# Patient Record
Sex: Female | Born: 1955 | Race: White | Hispanic: No | Smoking: Former smoker
Health system: Southern US, Community
[De-identification: ages and names within clinical notes are randomized; demographics above are authoritative.]

## PROBLEM LIST (undated history)

## (undated) DIAGNOSIS — I10 Essential (primary) hypertension: Secondary | ICD-10-CM

## (undated) DIAGNOSIS — F419 Anxiety disorder, unspecified: Secondary | ICD-10-CM

## (undated) DIAGNOSIS — K219 Gastro-esophageal reflux disease without esophagitis: Secondary | ICD-10-CM

## (undated) DIAGNOSIS — L570 Actinic keratosis: Secondary | ICD-10-CM

## (undated) DIAGNOSIS — F32A Depression, unspecified: Secondary | ICD-10-CM

## (undated) DIAGNOSIS — G4730 Sleep apnea, unspecified: Secondary | ICD-10-CM

## (undated) DIAGNOSIS — E119 Type 2 diabetes mellitus without complications: Secondary | ICD-10-CM

## (undated) DIAGNOSIS — K7400 Hepatic fibrosis, unspecified: Secondary | ICD-10-CM

## (undated) DIAGNOSIS — G35 Multiple sclerosis: Secondary | ICD-10-CM

## (undated) DIAGNOSIS — Z9889 Other specified postprocedural states: Secondary | ICD-10-CM

## (undated) HISTORY — PX: BREAST BIOPSY: SHX20

## (undated) HISTORY — DX: Actinic keratosis: L57.0

## (undated) HISTORY — PX: ENDOMETRIAL ABLATION: SHX621

## (undated) HISTORY — PX: NASAL SEPTUM SURGERY: SHX37

## (undated) HISTORY — DX: Multiple sclerosis: G35

## (undated) HISTORY — DX: Type 2 diabetes mellitus without complications: E11.9

## (undated) HISTORY — PX: BREAST EXCISIONAL BIOPSY: SUR124

---

## 2010-06-05 ENCOUNTER — Ambulatory Visit: Payer: Self-pay | Admitting: Neurology

## 2011-11-18 ENCOUNTER — Ambulatory Visit: Payer: Self-pay | Admitting: Neurology

## 2011-11-18 LAB — CREATININE, SERUM
Creatinine: 0.89 mg/dL (ref 0.60–1.30)
EGFR (African American): >60
EGFR (Non-African Amer.): >60

## 2013-01-11 ENCOUNTER — Ambulatory Visit: Payer: Self-pay | Admitting: Neurology

## 2013-12-17 ENCOUNTER — Ambulatory Visit: Payer: Self-pay | Admitting: Family

## 2014-02-16 ENCOUNTER — Encounter (INDEPENDENT_AMBULATORY_CARE_PROVIDER_SITE_OTHER): Payer: Self-pay | Admitting: *Deleted

## 2014-04-08 ENCOUNTER — Encounter: Payer: Self-pay | Admitting: Nutrition

## 2014-04-08 ENCOUNTER — Encounter: Payer: Medicare PPO | Attending: "Endocrinology | Admitting: Nutrition

## 2014-04-08 VITALS — Ht 65.0 in | Wt 211.0 lb

## 2014-04-08 DIAGNOSIS — Z6835 Body mass index (BMI) 35.0-35.9, adult: Secondary | ICD-10-CM | POA: Insufficient documentation

## 2014-04-08 DIAGNOSIS — Z713 Dietary counseling and surveillance: Secondary | ICD-10-CM | POA: Diagnosis not present

## 2014-04-08 DIAGNOSIS — Z794 Long term (current) use of insulin: Secondary | ICD-10-CM | POA: Diagnosis not present

## 2014-04-08 DIAGNOSIS — E669 Obesity, unspecified: Secondary | ICD-10-CM | POA: Diagnosis not present

## 2014-04-08 DIAGNOSIS — E1165 Type 2 diabetes mellitus with hyperglycemia: Secondary | ICD-10-CM

## 2014-04-08 DIAGNOSIS — E118 Type 2 diabetes mellitus with unspecified complications: Secondary | ICD-10-CM | POA: Insufficient documentation

## 2014-04-08 DIAGNOSIS — IMO0002 Reserved for concepts with insufficient information to code with codable children: Secondary | ICD-10-CM

## 2014-04-08 NOTE — Patient Instructions (Signed)
Goals:  Follow Diabetes Meal Plan as instructed  Eat 3 meals a day  Cut out sugar free products  Cut out diet cokes.  Increase fresh fruits and vegetables  Limit carbohydrate intake to 30-45 grams carbohydrate/meal  Monitor glucose levels as instructed by your doctor  Aim for 45 mins of physical activity daily Bring food record and glucose log to your next nutrition visit         Lose 5 lbs by next visit.

## 2014-04-08 NOTE — Progress Notes (Signed)
  Medical Nutrition Therapy:  Appt start time: 4128 end time:  1615.  Assessment:  Primary concerns today Diabetes. A1C was 9.6%.  Has been on Lantus 3 months.. 40 units of Lantus.  Rash was reason for stopping Invokanan but fouind out rash was from something else. Requested her to reconsider Invokana with Dr. Dorris Fetch. Took her self off of depression medication. Advised to talk to PCP. [facility] and left message for them to contact her to discuss coming off her depression medication. Doesn't eat beef.  She does her own cooking mostly. Currently separated.  7 day avg 147 mg/dl 14 day avg 154 mg/dl 30 day avg 146 mg/dl.  Preferred Learning Style:   Auditory  Visual  Hands on  Learning Readiness:   Not ready  Contemplating  Ready  Change in progress   MEDICATIONS: See list. Recently stopped her own depression medications. Called her PCP to inform them and to contact pt. If needed.   DIETARY INTAKE:  24-hr recall:  B ( AM): Cherrios regular, 1% milk,  Snk ( AM): none  L ( PM): [place] stick, peanutbutter and  Sf honey sandwich, Scoop sugar free ice cream, Diet coke Snk ( PM): D ( PM): meat and some vegetables and LS V8 juice Snk ( PM):  Beverages: Diet coke  Usual physical activity: Treatmill- 25-40 mins every night.. Estimated energy needs: 1500 calories 170 g carbohydrates 112 g protein 42 g fat  Progress Towards Goal(s):  In progress.   Nutritional Diagnosis:  NB-1.2 Harmful beliefs/attitudes about food or nutrition-related topics (use with caution) As related to Diabetes.  As evidenced by A1C 9.8%.    Intervention:  Nutrition Counseling and diabetes education on diet, meal planning, CHO Counting, target ranges for blood sugars, benefits of exercise and weight loss and need to avoid sugar free products of candy and diet sodas.  Goals:  Follow Diabetes Meal Plan as instructed  Eat 3 meals a day  Cut out sugar free products  Cut out diet  cokes.  Increase fresh fruits and vegetables  Limit carbohydrate intake to 30-45 grams carbohydrate/meal  Monitor glucose levels as instructed by your doctor  Aim for 45 mins of physical activity daily  Get A1C down to 7% in three months. Bring food record and glucose log to your next nutrition visit         Lose 5 lbs by next visit.  Teaching Method Utilized: Visual Auditory Hands on  Handouts given during visit include:  My Plate  The Meal Card  Barriers to learning/adherence to lifestyle change: none  Demonstrated degree of understanding via:  Teach Back   Monitoring/Evaluation:  Dietary intake, exercise, meal planning, SBG, and body weight in 1 month(s).

## 2014-05-26 ENCOUNTER — Encounter: Payer: Medicare PPO | Attending: "Endocrinology | Admitting: Nutrition

## 2014-05-26 VITALS — Ht 65.0 in | Wt 203.6 lb

## 2014-05-26 DIAGNOSIS — Z6833 Body mass index (BMI) 33.0-33.9, adult: Secondary | ICD-10-CM | POA: Insufficient documentation

## 2014-05-26 DIAGNOSIS — Z713 Dietary counseling and surveillance: Secondary | ICD-10-CM | POA: Diagnosis not present

## 2014-05-26 DIAGNOSIS — E118 Type 2 diabetes mellitus with unspecified complications: Secondary | ICD-10-CM | POA: Diagnosis present

## 2014-05-26 DIAGNOSIS — Z794 Long term (current) use of insulin: Secondary | ICD-10-CM | POA: Diagnosis not present

## 2014-05-26 DIAGNOSIS — E1165 Type 2 diabetes mellitus with hyperglycemia: Secondary | ICD-10-CM

## 2014-05-26 DIAGNOSIS — E669 Obesity, unspecified: Secondary | ICD-10-CM | POA: Diagnosis not present

## 2014-05-26 DIAGNOSIS — IMO0002 Reserved for concepts with insufficient information to code with codable children: Secondary | ICD-10-CM
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
## 2014-05-26 NOTE — Patient Instructions (Addendum)
Goals: Keep up the good work!! You lost another 5 lbs!!   Follow Diabetes Meal Plan as instructed  Eat more lower carb vegetables with meals.  Increase water and cut out diet soda  Increase physical activity to 45 minutes three times per week  Do not skip meals.  Take Insulin as prescribed.  Lose 1 lbs per week.  Goal A1C 7% in three months.

## 2014-08-10 ENCOUNTER — Encounter: Payer: Medicare PPO | Attending: "Endocrinology | Admitting: Nutrition

## 2014-08-10 VITALS — Ht 65.0 in | Wt 207.0 lb

## 2014-08-10 DIAGNOSIS — Z794 Long term (current) use of insulin: Secondary | ICD-10-CM | POA: Insufficient documentation

## 2014-08-10 DIAGNOSIS — IMO0002 Reserved for concepts with insufficient information to code with codable children: Secondary | ICD-10-CM

## 2014-08-10 DIAGNOSIS — Z6834 Body mass index (BMI) 34.0-34.9, adult: Secondary | ICD-10-CM | POA: Insufficient documentation

## 2014-08-10 DIAGNOSIS — E669 Obesity, unspecified: Secondary | ICD-10-CM | POA: Insufficient documentation

## 2014-08-10 DIAGNOSIS — Z713 Dietary counseling and surveillance: Secondary | ICD-10-CM | POA: Insufficient documentation

## 2014-08-10 DIAGNOSIS — E1165 Type 2 diabetes mellitus with hyperglycemia: Secondary | ICD-10-CM

## 2014-08-10 DIAGNOSIS — E118 Type 2 diabetes mellitus with unspecified complications: Secondary | ICD-10-CM | POA: Insufficient documentation

## 2014-08-10 NOTE — Patient Instructions (Addendum)
Goals: Keep up the good work but get back on track with exercise and eating more fresh fruits and vegetables.!!   Follow Diabetes Meal Plan as instructed  Eat more lower carb vegetables with meals.  Increase water and cut out diet soda  Increase physical activity to 45 minutes three times per week  Take Insulin as prescribed  Resume taking Invokana..  Get weight down to 200 lbs..  Get A1C % down 7%  in three months.

## 2014-08-10 NOTE — Progress Notes (Signed)
  Medical Nutrition Therapy:  Appt start time: 1400 end time:  1430  Assessment:  Primary concerns today Diabetes.  Most recent A1C is now 8% . Hasn't been as diligent about exercising and watching what she has been eating. BS higher than they were she notes. Ready to get back on track. Now back with her husband. BS log   Changes: BS 114-167 mg/cl. 40 of Lantus daily. Testing in am. Stopped Invokana due to hives. 2-3 weeks ago but hives still on her arm. Dr. Dorris Fetch told her today and resume taking INvokana since hives probably were not related to [facility]. Willing to resume walking on treadmill and get back to cutting out snacks and overeating. Still working on cutting out diet coke but drinking less of it.   Preferred Learning Style:   Auditory  Visual  Hands on  Learning Readiness:    Ready  Change in progress  MEDICATIONS: See list.   DIETARY INTAKE:  24-hr recall:  B ( AM): Eggs, bacon and hash brown, coffee Snk ( AM): hard boiled egg L ( PM): Vegetables soup, and 1 slice rasin bread, water Snk ( PM): D ( PM): [place] chowder  2 cup, and toss salad with balsamic dressing. Water Snk ( PM):  Beverages: Water, Diet coke.  Usual physical activity: has stopped walking Estimated energy needs: 1500 calories 170 g carbohydrates 112 g protein 42 g fat  Progress Towards Goal(s):  In progress.   Nutritional Diagnosis:  NB-1.2 Harmful beliefs/attitudes about food or nutrition-related topics (use with caution) As related to Diabetes.  As evidenced by A1C 9.8%.    Intervention:  Nutrition Counseling and diabetes education on diet, meal planning, CHO Counting, reading food labels and importance of eating on a regular schedule. Footcare and testing blood sugars. Target ranges for blood sugars.   Goals: Keep up the good work but get back on track with exercise and eating more fresh fruits and vegetables.!!   Follow Diabetes Meal Plan as instructed  Eat more lower carb vegetables  with meals.  Increase water and cut out diet soda  Increase physical activity to 45 minutes three times per week  Take Insulin as prescribed  Resume taking Invokana..  Get weight down to 200 lbs..  Get A1C % down 7%  in three months.    Teaching Method Utilized: Visual Auditory Hands on  Handouts given during visit include:  My Plate  The Meal Card  Barriers to learning/adherence to lifestyle change: none  Demonstrated degree of understanding via:  Teach Back   Monitoring/Evaluation:  Dietary intake, exercise, meal planning, SBG, and body weight in 3 month(s).

## 2014-11-10 ENCOUNTER — Encounter: Payer: Self-pay | Admitting: Nutrition

## 2014-11-10 ENCOUNTER — Encounter: Payer: Medicare PPO | Attending: "Endocrinology | Admitting: Nutrition

## 2014-11-10 VITALS — Ht 64.5 in | Wt 206.0 lb

## 2014-11-10 DIAGNOSIS — E669 Obesity, unspecified: Secondary | ICD-10-CM

## 2014-11-10 DIAGNOSIS — IMO0002 Reserved for concepts with insufficient information to code with codable children: Secondary | ICD-10-CM

## 2014-11-10 DIAGNOSIS — E1165 Type 2 diabetes mellitus with hyperglycemia: Secondary | ICD-10-CM

## 2014-11-10 DIAGNOSIS — E118 Type 2 diabetes mellitus with unspecified complications: Principal | ICD-10-CM

## 2014-11-10 DIAGNOSIS — Z794 Long term (current) use of insulin: Principal | ICD-10-CM

## 2014-11-10 NOTE — Patient Instructions (Signed)
Goals: Keep up the good work but get back on track with exercise and eating more fresh fruits and vegetables.!!   Follow Diabetes Meal Plan as instructed Cut out snacks between meals. Reduce carb intake at meals. Increase low carb vegetables. Cut out diet cokes. Get A1C to 7% or below. Increase physical activity to 30 minutes daily. Check blood sugars at bedtime a few times per week.
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
## 2014-11-24 ENCOUNTER — Encounter: Payer: Self-pay | Admitting: "Endocrinology

## 2014-11-24 ENCOUNTER — Ambulatory Visit (INDEPENDENT_AMBULATORY_CARE_PROVIDER_SITE_OTHER): Payer: Medicare PPO | Admitting: "Endocrinology

## 2014-11-24 VITALS — BP 128/84 | HR 96 | Ht 66.0 in | Wt 207.0 lb

## 2014-11-24 DIAGNOSIS — E119 Type 2 diabetes mellitus without complications: Secondary | ICD-10-CM | POA: Insufficient documentation

## 2014-11-24 DIAGNOSIS — E1165 Type 2 diabetes mellitus with hyperglycemia: Secondary | ICD-10-CM | POA: Diagnosis not present

## 2014-11-24 DIAGNOSIS — E785 Hyperlipidemia, unspecified: Secondary | ICD-10-CM | POA: Insufficient documentation

## 2014-11-24 DIAGNOSIS — Z794 Long term (current) use of insulin: Secondary | ICD-10-CM

## 2014-11-24 DIAGNOSIS — I10 Essential (primary) hypertension: Secondary | ICD-10-CM | POA: Diagnosis not present

## 2014-11-24 DIAGNOSIS — IMO0002 Reserved for concepts with insufficient information to code with codable children: Secondary | ICD-10-CM

## 2014-11-24 DIAGNOSIS — E118 Type 2 diabetes mellitus with unspecified complications: Secondary | ICD-10-CM | POA: Diagnosis not present

## 2014-11-24 DIAGNOSIS — E782 Mixed hyperlipidemia: Secondary | ICD-10-CM | POA: Insufficient documentation

## 2014-11-24 MED ORDER — CANAGLIFLOZIN 100 MG PO TABS: 100.0000 mg | ORAL_TABLET | Freq: Every day | ORAL | Status: DC

## 2014-11-24 MED ORDER — METFORMIN HCL 1000 MG PO TABS: 1000.0000 mg | ORAL_TABLET | Freq: Two times a day (BID) | ORAL | Status: DC

## 2014-11-24 NOTE — Progress Notes (Signed)
Subjective:    Patient ID: Melanie Schultz, female    DOB: [birth year],    Past Medical History  Diagnosis Date  . Diabetes mellitus without complication ([place])   . Multiple sclerosis [facility])    Past Surgical History  Procedure Laterality Date  . Cesarean section    . Nasal septum surgery     Social History   Social History  . Marital Status: Divorced    Spouse Name: N/A  . Number of Children: N/A  . Years of Education: N/A   Social History Main Topics  . Smoking status: Former Research scientist (life sciences)  . Smokeless tobacco: None  . Alcohol Use: No  . Drug Use: No  . Sexual Activity: Not Asked   Other Topics Concern  . None   Social History Narrative   Outpatient Encounter Prescriptions as of 11/24/2014  Medication Sig  . amLODipine (NORVASC) 5 MG tablet Take 5 mg by mouth daily.  Marland Kitchen atenolol (TENORMIN) 50 MG tablet Take 50 mg by mouth daily.  Marland Kitchen atorvastatin (LIPITOR) 80 MG tablet Take 80 mg by mouth daily.  . canagliflozin (INVOKANA) 100 MG TABS tablet Take 1 tablet (100 mg total) by mouth daily.  . insulin glargine (LANTUS) 100 UNIT/ML injection Inject into the skin at bedtime.  . metFORMIN (GLUCOPHAGE) 1000 MG tablet Take 1 tablet (1,000 mg total) by mouth 2 (two) times daily with a meal.  . methylphenidate (DAYTRANA) 20 MG/9HR Place 1 patch onto the skin daily. wear patch for 9 hours only each day  . omeprazole (PRILOSEC) 20 MG capsule Take 20 mg by mouth daily.  . Teriflunomide 14 MG TABS Take by mouth.  . [DISCONTINUED] canagliflozin (INVOKANA) 100 MG TABS tablet Take 100 mg by mouth.  . [DISCONTINUED] metFORMIN (GLUCOPHAGE) 1000 MG tablet Take 1,000 mg by mouth 2 (two) times daily with a meal.   No facility-administered encounter medications on file as of 11/24/2014.   ALLERGIES: No Known Allergies VACCINATION STATUS:  There is no immunization history on file for this patient.  Diabetes She presents for her follow-up diabetic visit. She has type 2 diabetes mellitus. Onset  time: She was diagnosed at approximate age of 67 years. Her disease course has been improving. There are no hypoglycemic associated symptoms. Pertinent negatives for hypoglycemia include no confusion, headaches, pallor or seizures. There are no diabetic associated symptoms. Pertinent negatives for diabetes include no chest pain, no fatigue, no polydipsia, no polyphagia and no polyuria. There are no hypoglycemic complications. Symptoms are improving. There are no diabetic complications. Risk factors for coronary artery disease include dyslipidemia, hypertension and obesity. Current diabetic treatment includes insulin injections and oral agent (dual therapy). She is compliant with treatment most of the time. She has had a previous visit with a dietitian. She participates in exercise intermittently. Her home blood glucose trend is decreasing steadily. Her overall blood glucose range is 140-180 mg/dl. An ACE inhibitor/angiotensin II receptor blocker is being taken. Eye exam is current.  Hyperlipidemia This is a chronic problem. The current episode started more than 1 year ago. Pertinent negatives include no chest pain, myalgias or shortness of breath. Current antihyperlipidemic treatment includes statins.  Hypertension This is a chronic problem. The current episode started more than 1 year ago. Pertinent negatives include no chest pain, headaches, palpitations or shortness of breath. Past treatments include ACE inhibitors.     Review of Systems  Constitutional: Negative for fatigue and unexpected weight change.  HENT: Negative for trouble swallowing and voice change.  Eyes: Negative for visual disturbance.  Respiratory: Negative for cough, shortness of breath and wheezing.   Cardiovascular: Negative for chest pain, palpitations and leg swelling.  Gastrointestinal: Negative for nausea, vomiting and diarrhea.  Endocrine: Negative for cold intolerance, heat intolerance, polydipsia, polyphagia and polyuria.   Musculoskeletal: Negative for myalgias and arthralgias.  Skin: Negative for color change, pallor, rash and wound.  Neurological: Negative for seizures and headaches.  Psychiatric/Behavioral: Negative for suicidal ideas and confusion.    Objective:    BP 128/84 mmHg  Pulse 96  Ht 5' 6" (1.676 m)  Wt 207 lb (93.895 kg)  BMI 33.43 kg/m2  SpO2 97%  Wt Readings from Last 3 Encounters:  11/24/14 207 lb (93.895 kg)  11/10/14 206 lb (93.441 kg)  08/10/14 207 lb (93.895 kg)    Physical Exam  Constitutional: She is oriented to person, place, and time. She appears well-developed.  HENT:  Head: Normocephalic and atraumatic.  Eyes: EOM are normal.  Neck: Normal range of motion. Neck supple. No tracheal deviation present. No thyromegaly present.  Cardiovascular: Normal rate and regular rhythm.   Pulmonary/Chest: Effort normal and breath sounds normal.  Abdominal: Soft. Bowel sounds are normal. There is no tenderness. There is no guarding.  Musculoskeletal: Normal range of motion. She exhibits no edema.  Neurological: She is alert and oriented to person, place, and time. She has normal reflexes. No cranial nerve deficit. Coordination normal.  Skin: Skin is warm and dry. No rash noted. No erythema. No pallor.  Psychiatric: She has a normal mood and affect. Judgment normal.    Results for orders placed or performed in visit on 11/18/11  Creatinine, serum  Result Value Ref Range   Creatinine 0.89 0.60-1.30 mg/dL   EGFR (African American) >60    EGFR (Non-African Amer.) >60    Complete Blood Count (Most recent): No results found for: WBC, HGB, HCT, MCV, PLT Chemistry (most recent): Lab Results  Component Value Date   CREATININE 0.89 11/18/2011      Assessment & Plan:   1. Uncontrolled type 2 diabetes mellitus with complication, with long-term current use of insulin ([place])  Patient came with improved glucose profile, EAG at  138, whenever her recent labs did not include A1c.   Glucose logs and insulin administration records pertaining to this visit,  to be scanned into patient's records.  Recent labs reviewed. - Patient remains at a high risk for more acute and chronic complications of diabetes which include CAD, CVA, CKD, retinopathy, and neuropathy. These are all discussed in detail with the patient.  - I have re-counseled the patient on diet management and weight loss  by adopting a carbohydrate restricted / protein rich  Diet. - Patient is advised to stick to a routine mealtimes to eat 3 meals  a day and avoid unnecessary snacks ( to snack only to correct hypoglycemia).  - Suggestion is made for patient to avoid simple carbohydrates   from their diet including Cakes , Desserts, Ice Cream,  Soda (  diet and regular) , Sweet Tea , Candies,  Chips, Cookies, Artificial Sweeteners,   and "Sugar-free" Products .  This will help patient to have stable blood glucose profile and potentially avoid unintended  Weight gain.  - The patient  has been  scheduled with Jearld Fenton, RDN, CDE for individualized DM education. - I have approached patient with the following individualized plan to manage diabetes and patient agrees.  -Continue Lantus 40 units qhs, associated with monitoring of BG QAM.  -  Adjustment parameters for hypo and hyperglycemia were given in a written document to patient.  -Patient is encouraged to call clinic for blood glucose levels less than 70 or above 300 mg /dl. -I will continue MTF 1gm po BID, and Invokana 182m po qam.  - Patient will be considered for incretin therapy as appropriate next visit. - Patient specific target  for A1c; LDL, HDL, Triglycerides, and  Waist Circumference were discussed in detail.  2) BP/HTN: Controlled. Continue current medications including ACEI. 3) Lipids/HPL:  continue statins. 4)  Weight/Diet: CDE consult in progress, exercise, and carbohydrates information provided.  5) Chronic Care/Health Maintenance:  -Patient  on  ACEI and Statin medications and encouraged to continue to follow up with Ophthalmology, Podiatrist at least yearly or according to recommendations, and advised to  stay away from smoking. I have recommended yearly flu vaccine and pneumonia vaccination at least every 5 years; moderate intensity exercise for up to 150 minutes weekly; and  sleep for at least 7 hours a day.  I advised patient to maintain close follow up with their PCP for primary care needs.  Patient is asked to bring meter and  blood glucose logs during their next visit.   Follow up plan: Return in about 3 months (around 02/24/2015) for diabetes, high blood pressure, high cholesterol, follow up with pre-visit labs, meter, and logs.  GGlade Lloyd MD Phone: [phone] Fax: [phone]  11/24/2014, 4:16 PM

## 2014-11-24 NOTE — Patient Instructions (Signed)

## 2014-11-25 ENCOUNTER — Other Ambulatory Visit: Payer: Self-pay

## 2014-11-25 MED ORDER — CANAGLIFLOZIN 100 MG PO TABS: 100.0000 mg | ORAL_TABLET | Freq: Every day | ORAL | Status: DC

## 2014-12-23 ENCOUNTER — Other Ambulatory Visit: Payer: Self-pay

## 2014-12-23 MED ORDER — INSULIN GLARGINE 100 UNIT/ML ~~LOC~~ SOLN: 40.0000 [IU] | Freq: Every day | SUBCUTANEOUS | Status: DC

## 2014-12-26 ENCOUNTER — Telehealth: Payer: Self-pay | Admitting: "Endocrinology

## 2014-12-26 MED ORDER — INSULIN GLARGINE 100 UNIT/ML SOLOSTAR PEN: 40.0000 [IU] | PEN_INJECTOR | Freq: Every day | SUBCUTANEOUS | Status: DC

## 2014-12-26 NOTE — Addendum Note (Signed)
Addended by: Lavell Luster A on: 12/26/2014 12:59 PM   Modules accepted: Orders

## 2014-12-26 NOTE — Telephone Encounter (Signed)
Needs refill on lantus

## 2015-02-10 ENCOUNTER — Other Ambulatory Visit: Payer: Self-pay | Admitting: "Endocrinology

## 2015-02-21 LAB — HEMOGLOBIN A1C: Hemoglobin A1C: 7.7

## 2015-02-27 ENCOUNTER — Ambulatory Visit (INDEPENDENT_AMBULATORY_CARE_PROVIDER_SITE_OTHER): Payer: Medicare PPO | Admitting: "Endocrinology

## 2015-02-27 ENCOUNTER — Encounter: Payer: Self-pay | Admitting: "Endocrinology

## 2015-02-27 VITALS — BP 117/85 | HR 82 | Ht 67.0 in | Wt 206.0 lb

## 2015-02-27 DIAGNOSIS — E118 Type 2 diabetes mellitus with unspecified complications: Secondary | ICD-10-CM

## 2015-02-27 DIAGNOSIS — E1165 Type 2 diabetes mellitus with hyperglycemia: Secondary | ICD-10-CM | POA: Diagnosis not present

## 2015-02-27 DIAGNOSIS — Z794 Long term (current) use of insulin: Secondary | ICD-10-CM

## 2015-02-27 DIAGNOSIS — E785 Hyperlipidemia, unspecified: Secondary | ICD-10-CM

## 2015-02-27 DIAGNOSIS — I10 Essential (primary) hypertension: Secondary | ICD-10-CM | POA: Diagnosis not present

## 2015-02-27 DIAGNOSIS — IMO0002 Reserved for concepts with insufficient information to code with codable children: Secondary | ICD-10-CM

## 2015-02-27 MED ORDER — CANAGLIFLOZIN 100 MG PO TABS: 100.0000 mg | ORAL_TABLET | Freq: Every day | ORAL | Status: DC

## 2015-02-27 MED ORDER — INSULIN PEN NEEDLE 31G X 8 MM MISC: 1.0000 | Status: DC

## 2015-02-27 MED ORDER — INSULIN GLARGINE 100 UNIT/ML SOLOSTAR PEN: 46.0000 [IU] | PEN_INJECTOR | Freq: Every day | SUBCUTANEOUS | Status: DC

## 2015-02-27 NOTE — Progress Notes (Signed)
Subjective:    Patient ID: Barbarann Ehlers, female    DOB: 1955-05-14,    Past Medical History  Diagnosis Date  . Diabetes mellitus without complication ([place])   . Multiple sclerosis [facility])    Past Surgical History  Procedure Laterality Date  . Cesarean section    . Nasal septum surgery     Social History   Social History  . Marital Status: Divorced    Spouse Name: N/A  . Number of Children: N/A  . Years of Education: N/A   Social History Main Topics  . Smoking status: Former Research scientist (life sciences)  . Smokeless tobacco: None  . Alcohol Use: No  . Drug Use: No  . Sexual Activity: Not Asked   Other Topics Concern  . None   Social History Narrative   Outpatient Encounter Prescriptions as of 02/27/2015  Medication Sig  . canagliflozin (INVOKANA) 100 MG TABS tablet Take 1 tablet (100 mg total) by mouth daily.  . Insulin Glargine (LANTUS SOLOSTAR) 100 UNIT/ML Solostar Pen Inject 46 Units into the skin at bedtime.  . metFORMIN (GLUCOPHAGE) 1000 MG tablet Take 1 tablet (1,000 mg total) by mouth 2 (two) times daily with a meal.  . [DISCONTINUED] canagliflozin (INVOKANA) 100 MG TABS tablet Take 1 tablet (100 mg total) by mouth daily.  . [DISCONTINUED] Insulin Glargine (LANTUS SOLOSTAR) 100 UNIT/ML Solostar Pen Inject 40 Units into the skin at bedtime.  Marland Kitchen amLODipine (NORVASC) 5 MG tablet Take 5 mg by mouth daily.  Marland Kitchen atenolol (TENORMIN) 50 MG tablet Take 50 mg by mouth daily.  Marland Kitchen atorvastatin (LIPITOR) 80 MG tablet Take 80 mg by mouth daily.  . B-D ULTRAFINE III SHORT PEN 31G X 8 MM MISC USE AS DIRECTED  . Insulin Pen Needle (B-D ULTRAFINE III SHORT PEN) 31G X 8 MM MISC 1 each by Does not apply route as directed.  . methylphenidate (DAYTRANA) 20 MG/9HR Place 1 patch onto the skin daily. wear patch for 9 hours only each day  . omeprazole (PRILOSEC) 20 MG capsule Take 20 mg by mouth daily.  . Teriflunomide 14 MG TABS Take by mouth.   No facility-administered encounter medications on file as of  02/27/2015.   ALLERGIES: No Known Allergies VACCINATION STATUS:  There is no immunization history on file for this patient.  Diabetes She presents for her follow-up diabetic visit. She has type 2 diabetes mellitus. Onset time: She was diagnosed at approximate age of 59 years. Her disease course has been stable. There are no hypoglycemic associated symptoms. Pertinent negatives for hypoglycemia include no confusion, headaches, pallor or seizures. There are no diabetic associated symptoms. Pertinent negatives for diabetes include no chest pain, no fatigue, no polydipsia, no polyphagia and no polyuria. There are no hypoglycemic complications. Symptoms are stable. There are no diabetic complications. Risk factors for coronary artery disease include dyslipidemia, hypertension and obesity. Current diabetic treatment includes insulin injections and oral agent (dual therapy). She is compliant with treatment most of the time. She is following a generally unhealthy diet. She has had a previous visit with a dietitian. She participates in exercise intermittently. Her home blood glucose trend is decreasing steadily. Her breakfast blood glucose range is generally 180-200 mg/dl. Her overall blood glucose range is 140-180 mg/dl. An ACE inhibitor/angiotensin II receptor blocker is being taken. Eye exam is current.  Hyperlipidemia This is a chronic problem. The current episode started more than 1 year ago. Pertinent negatives include no chest pain, myalgias or shortness of breath. Current antihyperlipidemic  treatment includes statins.  Hypertension This is a chronic problem. The current episode started more than 1 year ago. Pertinent negatives include no chest pain, headaches, palpitations or shortness of breath. Past treatments include ACE inhibitors.     Review of Systems  Constitutional: Negative for fatigue and unexpected weight change.  HENT: Negative for trouble swallowing and voice change.   Eyes: Negative  for visual disturbance.  Respiratory: Negative for cough, shortness of breath and wheezing.   Cardiovascular: Negative for chest pain, palpitations and leg swelling.  Gastrointestinal: Negative for nausea, vomiting and diarrhea.  Endocrine: Negative for cold intolerance, heat intolerance, polydipsia, polyphagia and polyuria.  Musculoskeletal: Negative for myalgias and arthralgias.  Skin: Negative for color change, pallor, rash and wound.  Neurological: Negative for seizures and headaches.  Psychiatric/Behavioral: Negative for suicidal ideas and confusion.    Objective:    BP 117/85 mmHg  Pulse 82  Ht _0  (1.702 m)  Wt 206 lb (93.441 kg)  BMI 32.26 kg/m2  SpO2 98%  Wt Readings from Last 3 Encounters:  02/27/15 206 lb (93.441 kg)  11/24/14 207 lb (93.895 kg)  11/10/14 206 lb (93.441 kg)    Physical Exam  Constitutional: She is oriented to person, place, and time. She appears well-developed.  HENT:  Head: Normocephalic and atraumatic.  Eyes: EOM are normal.  Neck: Normal range of motion. Neck supple. No tracheal deviation present. No thyromegaly present.  Cardiovascular: Normal rate and regular rhythm.   Pulmonary/Chest: Effort normal and breath sounds normal.  Abdominal: Soft. Bowel sounds are normal. There is no tenderness. There is no guarding.  Musculoskeletal: Normal range of motion. She exhibits no edema.  Neurological: She is alert and oriented to person, place, and time. She has normal reflexes. No cranial nerve deficit. Coordination normal.  Skin: Skin is warm and dry. No rash noted. No erythema. No pallor.  Psychiatric: She has a normal mood and affect. Judgment normal.    Results for orders placed or performed in visit on 11/18/11  Creatinine, serum  Result Value Ref Range   Creatinine 0.89 0.60-1.30 mg/dL   EGFR (African American) >60    EGFR (Non-African Amer.) >60    Complete Blood Count (Most recent): No results found for: WBC, HGB, HCT, MCV,  PLT Chemistry (most recent): Lab Results  Component Value Date   CREATININE 0.89 11/18/2011   02/21/2015 A1c 7.7%.   Assessment & Plan:   1. Uncontrolled type 2 diabetes mellitus with complication, with long-term current use of insulin ([place])  Patient came with improved glucose profile, fasting EAG at  159, A1c improved to 7.7% .   Glucose logs and insulin administration records pertaining to this visit,  to be scanned into patient's records.  Recent labs reviewed. - Patient remains at a high risk for more acute and chronic complications of diabetes which include CAD, CVA, CKD, retinopathy, and neuropathy. These are all discussed in detail with the patient.  - I have re-counseled the patient on diet management and weight loss  by adopting a carbohydrate restricted / protein rich  Diet. - Patient is advised to stick to a routine mealtimes to eat 3 meals  a day and avoid unnecessary snacks ( to snack only to correct hypoglycemia).  - Suggestion is made for patient to avoid simple carbohydrates   from their diet including Cakes , Desserts, Ice Cream,  Soda (  diet and regular) , Sweet Tea , Candies,  Chips, Cookies, Artificial Sweeteners,   and "Sugar-free" Products .  This will help patient to have stable blood glucose profile and potentially avoid unintended  Weight gain.  - The patient  has been  scheduled with Jearld Fenton, RDN, CDE for individualized DM education. - I have approached patient with the following individualized plan to manage diabetes and patient agrees.  -Increase Lantus to 46  units qhs, associated with monitoring of BG QAM.  -Adjustment parameters for hypo and hyperglycemia were given in a written document to patient.  -Patient is encouraged to call clinic for blood glucose levels less than 70 or above 300 mg /dl. -I will continue MTF 1gm po BID, and Invokana 136m po qam.  - Patient will be considered for incretin therapy as appropriate next visit. - Patient  specific target  for A1c; LDL, HDL, Triglycerides, and  Waist Circumference were discussed in detail.  2) BP/HTN: Controlled. Continue current medications including ACEI. 3) Lipids/HPL:  continue statins. 4)  Weight/Diet: CDE consult in progress, exercise, and carbohydrates information provided.  5) Chronic Care/Health Maintenance:  -Patient  on ACEI and Statin medications and encouraged to continue to follow up with Ophthalmology, Podiatrist at least yearly or according to recommendations, and advised to  stay away from smoking. I have recommended yearly flu vaccine and pneumonia vaccination at least every 5 years; moderate intensity exercise for up to 150 minutes weekly; and  sleep for at least 7 hours a day.  I advised patient to maintain close follow up with their PCP for primary care needs.  Patient is asked to bring meter and  blood glucose logs during their next visit.   Follow up plan: Return in about 3 months (around 05/28/2015) for diabetes, high blood pressure, high cholesterol, follow up with pre-visit labs, meter, and logs.  GGlade Lloyd MD Phone: [phone] Fax: [phone]  02/27/2015, 2:15 PM

## 2015-02-27 NOTE — Patient Instructions (Signed)

## 2015-04-03 ENCOUNTER — Encounter: Payer: Self-pay | Admitting: Cardiology

## 2015-05-23 ENCOUNTER — Other Ambulatory Visit: Payer: Self-pay | Admitting: "Endocrinology

## 2015-05-24 LAB — BASIC METABOLIC PANEL
BUN: 14 mg/dL (ref 7–25)
CALCIUM: 9.5 mg/dL (ref 8.6–10.4)
CO2: 24 mmol/L (ref 20–31)
Chloride: 104 mmol/L (ref 98–110)
Creat: 0.69 mg/dL (ref 0.50–0.99)
Glucose, Bld: 159 mg/dL — ABNORMAL HIGH (ref 65–99)
Potassium: 4.1 mmol/L (ref 3.5–5.3)
SODIUM: 141 mmol/L (ref 135–146)

## 2015-05-24 LAB — HEMOGLOBIN A1C
Hgb A1c MFr Bld: 7.4 % — ABNORMAL HIGH (ref <5.7)
Mean Plasma Glucose: 166 mg/dL

## 2015-06-02 ENCOUNTER — Ambulatory Visit (INDEPENDENT_AMBULATORY_CARE_PROVIDER_SITE_OTHER): Payer: Medicare PPO | Admitting: "Endocrinology

## 2015-06-02 ENCOUNTER — Encounter: Payer: Self-pay | Admitting: "Endocrinology

## 2015-06-02 VITALS — BP 131/87 | HR 97 | Ht 67.0 in | Wt 204.0 lb

## 2015-06-02 DIAGNOSIS — I10 Essential (primary) hypertension: Secondary | ICD-10-CM

## 2015-06-02 DIAGNOSIS — E118 Type 2 diabetes mellitus with unspecified complications: Secondary | ICD-10-CM

## 2015-06-02 DIAGNOSIS — IMO0002 Reserved for concepts with insufficient information to code with codable children: Secondary | ICD-10-CM

## 2015-06-02 DIAGNOSIS — E785 Hyperlipidemia, unspecified: Secondary | ICD-10-CM | POA: Diagnosis not present

## 2015-06-02 DIAGNOSIS — Z794 Long term (current) use of insulin: Secondary | ICD-10-CM

## 2015-06-02 DIAGNOSIS — E1165 Type 2 diabetes mellitus with hyperglycemia: Secondary | ICD-10-CM

## 2015-06-02 NOTE — Patient Instructions (Signed)

## 2015-06-02 NOTE — Progress Notes (Signed)
Subjective:    Patient ID: Melanie Schultz, female    DOB: 28-Jun-1955,    Past Medical History  Diagnosis Date  . Diabetes mellitus without complication ([place])   . Multiple sclerosis [facility])    Past Surgical History  Procedure Laterality Date  . Cesarean section    . Nasal septum surgery     Social History   Social History  . Marital Status: Divorced    Spouse Name: N/A  . Number of Children: N/A  . Years of Education: N/A   Social History Main Topics  . Smoking status: Former Research scientist (life sciences)  . Smokeless tobacco: None  . Alcohol Use: No  . Drug Use: No  . Sexual Activity: Not Asked   Other Topics Concern  . None   Social History Narrative   Outpatient Encounter Prescriptions as of 06/02/2015  Medication Sig  . amLODipine (NORVASC) 5 MG tablet Take 5 mg by mouth daily.  Marland Kitchen atenolol (TENORMIN) 50 MG tablet Take 50 mg by mouth daily.  Marland Kitchen atorvastatin (LIPITOR) 80 MG tablet Take 80 mg by mouth daily.  . B-D ULTRAFINE III SHORT PEN 31G X 8 MM MISC USE AS DIRECTED  . canagliflozin (INVOKANA) 100 MG TABS tablet Take 1 tablet (100 mg total) by mouth daily.  . Insulin Glargine (LANTUS SOLOSTAR) 100 UNIT/ML Solostar Pen Inject 46 Units into the skin at bedtime.  . Insulin Pen Needle (B-D ULTRAFINE III SHORT PEN) 31G X 8 MM MISC 1 each by Does not apply route as directed.  . metFORMIN (GLUCOPHAGE) 1000 MG tablet Take 1 tablet (1,000 mg total) by mouth 2 (two) times daily with a meal.  . methylphenidate (DAYTRANA) 20 MG/9HR Place 1 patch onto the skin daily. wear patch for 9 hours only each day  . omeprazole (PRILOSEC) 20 MG capsule Take 20 mg by mouth daily.  . Teriflunomide 14 MG TABS Take by mouth.   No facility-administered encounter medications on file as of 06/02/2015.   ALLERGIES: No Known Allergies VACCINATION STATUS:  There is no immunization history on file for this patient.  Diabetes She presents for her follow-up diabetic visit. She has type 2 diabetes mellitus. Onset  time: She was diagnosed at approximate age of 70 years. Her disease course has been improving. There are no hypoglycemic associated symptoms. Pertinent negatives for hypoglycemia include no confusion, headaches, pallor or seizures. There are no diabetic associated symptoms. Pertinent negatives for diabetes include no chest pain, no fatigue, no polydipsia, no polyphagia and no polyuria. There are no hypoglycemic complications. Symptoms are improving. There are no diabetic complications. Risk factors for coronary artery disease include dyslipidemia, hypertension and obesity. Current diabetic treatment includes insulin injections and oral agent (dual therapy). She is compliant with treatment most of the time. She is following a generally unhealthy diet. She has had a previous visit with a dietitian. She participates in exercise intermittently. Her home blood glucose trend is decreasing steadily. Her breakfast blood glucose range is generally 140-180 mg/dl. Her overall blood glucose range is 140-180 mg/dl. An ACE inhibitor/angiotensin II receptor blocker is being taken. Eye exam is current.  Hyperlipidemia This is a chronic problem. The current episode started more than 1 year ago. Pertinent negatives include no chest pain, myalgias or shortness of breath. Current antihyperlipidemic treatment includes statins.  Hypertension This is a chronic problem. The current episode started more than 1 year ago. Pertinent negatives include no chest pain, headaches, palpitations or shortness of breath. Past treatments include ACE inhibitors.  Review of Systems  Constitutional: Negative for fatigue and unexpected weight change.  HENT: Negative for trouble swallowing and voice change.   Eyes: Negative for visual disturbance.  Respiratory: Negative for cough, shortness of breath and wheezing.   Cardiovascular: Negative for chest pain, palpitations and leg swelling.  Gastrointestinal: Negative for nausea, vomiting and  diarrhea.  Endocrine: Negative for cold intolerance, heat intolerance, polydipsia, polyphagia and polyuria.  Musculoskeletal: Negative for myalgias and arthralgias.  Skin: Negative for color change, pallor, rash and wound.  Neurological: Negative for seizures and headaches.  Psychiatric/Behavioral: Negative for suicidal ideas and confusion.    Objective:    BP 131/87 mmHg  Pulse 97  Ht 5\' 7"  (1.702 m)  Wt 204 lb (92.534 kg)  BMI 31.94 kg/m2  SpO2 99%  Wt Readings from Last 3 Encounters:  06/02/15 204 lb (92.534 kg)  02/27/15 206 lb (93.441 kg)  11/24/14 207 lb (93.895 kg)    Physical Exam  Constitutional: She is oriented to person, place, and time. She appears well-developed.  HENT:  Head: Normocephalic and atraumatic.  Eyes: EOM are normal.  Neck: Normal range of motion. Neck supple. No tracheal deviation present. No thyromegaly present.  Cardiovascular: Normal rate and regular rhythm.   Pulmonary/Chest: Effort normal and breath sounds normal.  Abdominal: Soft. Bowel sounds are normal. There is no tenderness. There is no guarding.  Musculoskeletal: Normal range of motion. She exhibits no edema.  Neurological: She is alert and oriented to person, place, and time. She has normal reflexes. No cranial nerve deficit. Coordination normal.  Skin: Skin is warm and dry. No rash noted. No erythema. No pallor.  Psychiatric: She has a normal mood and affect. Judgment normal.    Results for orders placed or performed in visit on [id number]  Basic metabolic panel  Result Value Ref Range   Sodium 141 135 - 146 mmol/L   Potassium 4.1 3.5 - 5.3 mmol/L   Chloride 104 98 - 110 mmol/L   CO2 24 20 - 31 mmol/L   Glucose, Bld 159 (H) 65 - 99 mg/dL   BUN 14 7 - 25 mg/dL   Creat 0.69 0.50 - 0.99 mg/dL   Calcium 9.5 8.6 - 10.4 mg/dL  Hemoglobin A1c  Result Value Ref Range   Hgb A1c MFr Bld 7.4 (H) <5.7 %   Mean Plasma Glucose 166 mg/dL   Complete Blood Count (Most recent): No results found  for: WBC, HGB, HCT, MCV, PLT Chemistry (most recent): Lab Results  Component Value Date   NA 141 05/23/2015   K 4.1 05/23/2015   CL 104 05/23/2015   CO2 24 05/23/2015   BUN 14 05/23/2015   CREATININE 0.69 05/23/2015   On 05/23/2015 A1c 7.4%.   Assessment & Plan:   1. Uncontrolled type 2 diabetes mellitus with complication, with long-term current use of insulin ([place])  Patient came with improved glucose profile, fasting EAG at  159, A1c improved to 7.4% .   Glucose logs and insulin administration records pertaining to this visit,  to be scanned into patient's records.  Recent labs reviewed. - Patient remains at a high risk for more acute and chronic complications of diabetes which include CAD, CVA, CKD, retinopathy, and neuropathy. These are all discussed in detail with the patient.  - I have re-counseled the patient on diet management and weight loss  by adopting a carbohydrate restricted / protein rich  Diet. - Patient is advised to stick to a routine mealtimes to eat 3 meals  a day and avoid unnecessary snacks ( to snack only to correct hypoglycemia).  - Suggestion is made for patient to avoid simple carbohydrates   from their diet including Cakes , Desserts, Ice Cream,  Soda (  diet and regular) , Sweet Tea , Candies,  Chips, Cookies, Artificial Sweeteners,   and "Sugar-free" Products .  This will help patient to have stable blood glucose profile and potentially avoid unintended  Weight gain.  - The patient  has been  scheduled with Jearld Fenton, RDN, CDE for individualized DM education. - I have approached patient with the following individualized plan to manage diabetes and patient agrees.  - Continue  Lantus 46  units qhs, associated with monitoring of BG QAM.  -Adjustment parameters for hypo and hyperglycemia were given in a written document to patient.  -Patient is encouraged to call clinic for blood glucose levels less than 70 or above 300 mg /dl. -I will continue MTF  1gm po BID, and Invokana 100mg  po qam.  - Patient will be considered for incretin therapy as appropriate next visit. - Patient specific target  for A1c; LDL, HDL, Triglycerides, and  Waist Circumference were discussed in detail.  2) BP/HTN: Controlled. Continue current medications including ACEI. 3) Lipids/HPL:  continue statins. 4)  Weight/Diet: CDE consult in progress, exercise, and carbohydrates information provided.  5) Chronic Care/Health Maintenance:  -Patient  on ACEI and Statin medications and encouraged to continue to follow up with Ophthalmology, Podiatrist at least yearly or according to recommendations, and advised to  stay away from smoking. I have recommended yearly flu vaccine and pneumonia vaccination at least every 5 years; moderate intensity exercise for up to 150 minutes weekly; and  sleep for at least 7 hours a day.  I advised patient to maintain close follow up with their PCP for primary care needs.  Patient is asked to bring meter and  blood glucose logs during their next visit.   Follow up plan: Return in about 3 months (around 09/01/2015) for diabetes, high blood pressure, high cholesterol, follow up with pre-visit labs, meter, and logs.  Glade Lloyd, MD Phone: [phone]  Fax: [phone]   06/02/2015, 2:24 PM

## 2015-06-14 ENCOUNTER — Other Ambulatory Visit: Payer: Self-pay | Admitting: "Endocrinology

## 2015-06-15 ENCOUNTER — Other Ambulatory Visit: Payer: Self-pay | Admitting: "Endocrinology

## 2015-06-16 ENCOUNTER — Other Ambulatory Visit: Payer: Self-pay | Admitting: "Endocrinology

## 2015-08-15 ENCOUNTER — Other Ambulatory Visit: Payer: Self-pay

## 2015-08-15 MED ORDER — METFORMIN HCL 1000 MG PO TABS: 1000.0000 mg | ORAL_TABLET | Freq: Two times a day (BID) | ORAL | Status: DC

## 2015-08-21 ENCOUNTER — Other Ambulatory Visit: Payer: Self-pay | Admitting: "Endocrinology

## 2015-08-22 LAB — COMPLETE METABOLIC PANEL WITH GFR
ALT: 15 U/L (ref 6–29)
AST: 13 U/L (ref 10–35)
Albumin: 4.5 g/dL (ref 3.6–5.1)
Alkaline Phosphatase: 56 U/L (ref 33–130)
BUN: 21 mg/dL (ref 7–25)
CALCIUM: 9.6 mg/dL (ref 8.6–10.4)
CHLORIDE: 104 mmol/L (ref 98–110)
CO2: 23 mmol/L (ref 20–31)
Creat: 0.76 mg/dL (ref 0.50–0.99)
GFR, EST NON AFRICAN AMERICAN: 85 mL/min (ref >=60)
Glucose, Bld: 206 mg/dL — ABNORMAL HIGH (ref 65–99)
POTASSIUM: 4.3 mmol/L (ref 3.5–5.3)
Sodium: 140 mmol/L (ref 135–146)
Total Bilirubin: 0.3 mg/dL (ref 0.2–1.2)
Total Protein: 7.4 g/dL (ref 6.1–8.1)

## 2015-08-22 LAB — HEMOGLOBIN A1C
HEMOGLOBIN A1C: 7.2 % — AB (ref <5.7)
MEAN PLASMA GLUCOSE: 160 mg/dL

## 2015-09-01 ENCOUNTER — Ambulatory Visit (INDEPENDENT_AMBULATORY_CARE_PROVIDER_SITE_OTHER): Payer: Medicare PPO | Admitting: "Endocrinology

## 2015-09-01 ENCOUNTER — Encounter: Payer: Self-pay | Admitting: "Endocrinology

## 2015-09-01 VITALS — BP 134/85 | HR 98 | Ht 67.0 in | Wt 202.0 lb

## 2015-09-01 DIAGNOSIS — E118 Type 2 diabetes mellitus with unspecified complications: Secondary | ICD-10-CM

## 2015-09-01 DIAGNOSIS — I10 Essential (primary) hypertension: Secondary | ICD-10-CM | POA: Diagnosis not present

## 2015-09-01 DIAGNOSIS — Z794 Long term (current) use of insulin: Secondary | ICD-10-CM | POA: Diagnosis not present

## 2015-09-01 DIAGNOSIS — E1165 Type 2 diabetes mellitus with hyperglycemia: Secondary | ICD-10-CM

## 2015-09-01 DIAGNOSIS — E785 Hyperlipidemia, unspecified: Secondary | ICD-10-CM | POA: Diagnosis not present

## 2015-09-01 DIAGNOSIS — IMO0002 Reserved for concepts with insufficient information to code with codable children: Secondary | ICD-10-CM

## 2015-09-01 NOTE — Patient Instructions (Signed)

## 2015-09-01 NOTE — Progress Notes (Signed)
Subjective:    Patient ID: Melanie Schultz, female    DOB: October 23, 1955,    Past Medical History:  Diagnosis Date  . Diabetes mellitus without complication ([place])   . Multiple sclerosis ([place])    Past Surgical History:  Procedure Laterality Date  . CESAREAN SECTION    . NASAL SEPTUM SURGERY     Social History   Social History  . Marital status: Divorced    Spouse name: N/A  . Number of children: N/A  . Years of education: N/A   Social History Main Topics  . Smoking status: Former Research scientist (life sciences)  . Smokeless tobacco: Never Used  . Alcohol use No  . Drug use: No  . Sexual activity: Not Asked   Other Topics Concern  . None   Social History Narrative  . None   Outpatient Encounter Prescriptions as of 09/01/2015  Medication Sig  . Vitamin D, Ergocalciferol, (DRISDOL) 50000 units CAPS capsule Take 50,000 Units by mouth every 7 (seven) days.  Marland Kitchen amLODipine (NORVASC) 5 MG tablet Take 5 mg by mouth daily.  Marland Kitchen atenolol (TENORMIN) 50 MG tablet Take 50 mg by mouth daily.  Marland Kitchen atorvastatin (LIPITOR) 80 MG tablet Take 80 mg by mouth daily.  . B-D ULTRAFINE III SHORT PEN 31G X 8 MM MISC USE AS DIRECTED  . canagliflozin (INVOKANA) 100 MG TABS tablet Take 1 tablet (100 mg total) by mouth daily.  . Insulin Pen Needle (B-D ULTRAFINE III SHORT PEN) 31G X 8 MM MISC 1 each by Does not apply route as directed.  Marland Kitchen LANTUS SOLOSTAR 100 UNIT/ML Solostar Pen INJECT 46 UNITS SUBCUTANEOUSLY AT BEDTIME  . metFORMIN (GLUCOPHAGE) 1000 MG tablet Take 1 tablet (1,000 mg total) by mouth 2 (two) times daily with a meal.  . methylphenidate (DAYTRANA) 20 MG/9HR Place 1 patch onto the skin daily. wear patch for 9 hours only each day  . omeprazole (PRILOSEC) 20 MG capsule Take 20 mg by mouth daily.  . Teriflunomide 14 MG TABS Take by mouth.   No facility-administered encounter medications on file as of 09/01/2015.    ALLERGIES: No Known Allergies VACCINATION STATUS:  There is no immunization history on file for  this patient.  Diabetes  She presents for her follow-up diabetic visit. She has type 2 diabetes mellitus. Onset time: She was diagnosed at approximate age of 45 years. Her disease course has been improving. There are no hypoglycemic associated symptoms. Pertinent negatives for hypoglycemia include no confusion, headaches, pallor or seizures. There are no diabetic associated symptoms. Pertinent negatives for diabetes include no chest pain, no fatigue, no polydipsia, no polyphagia and no polyuria. There are no hypoglycemic complications. Symptoms are improving. There are no diabetic complications. Risk factors for coronary artery disease include dyslipidemia, hypertension and obesity. Current diabetic treatment includes insulin injections and oral agent (dual therapy). She is compliant with treatment most of the time. She is following a generally unhealthy diet. She has had a previous visit with a dietitian. She participates in exercise intermittently. Her home blood glucose trend is decreasing steadily. Her breakfast blood glucose range is generally 140-180 mg/dl. Her overall blood glucose range is 140-180 mg/dl. An ACE inhibitor/angiotensin II receptor blocker is being taken. Eye exam is current.  Hyperlipidemia  This is a chronic problem. The current episode started more than 1 year ago. Pertinent negatives include no chest pain, myalgias or shortness of breath. Current antihyperlipidemic treatment includes statins.  Hypertension  This is a chronic problem. The current episode started  more than 1 year ago. Pertinent negatives include no chest pain, headaches, palpitations or shortness of breath. Past treatments include ACE inhibitors.     Review of Systems  Constitutional: Negative for fatigue and unexpected weight change.  HENT: Negative for trouble swallowing and voice change.   Eyes: Negative for visual disturbance.  Respiratory: Negative for cough, shortness of breath and wheezing.    Cardiovascular: Negative for chest pain, palpitations and leg swelling.  Gastrointestinal: Negative for diarrhea, nausea and vomiting.  Endocrine: Negative for cold intolerance, heat intolerance, polydipsia, polyphagia and polyuria.  Musculoskeletal: Negative for arthralgias and myalgias.  Skin: Negative for color change, pallor, rash and wound.  Neurological: Negative for seizures and headaches.  Psychiatric/Behavioral: Negative for confusion and suicidal ideas.    Objective:    BP 134/85   Pulse 98   Ht 5\' 7"  (1.702 m)   Wt 202 lb (91.6 kg)   BMI 31.64 kg/m   Wt Readings from Last 3 Encounters:  09/01/15 202 lb (91.6 kg)  06/02/15 204 lb (92.5 kg)  02/27/15 206 lb (93.4 kg)    Physical Exam  Constitutional: She is oriented to person, place, and time. She appears well-developed.  HENT:  Head: Normocephalic and atraumatic.  Eyes: EOM are normal.  Neck: Normal range of motion. Neck supple. No tracheal deviation present. No thyromegaly present.  Cardiovascular: Normal rate and regular rhythm.   Pulmonary/Chest: Effort normal and breath sounds normal.  Abdominal: Soft. Bowel sounds are normal. There is no tenderness. There is no guarding.  Musculoskeletal: Normal range of motion. She exhibits no edema.  Neurological: She is alert and oriented to person, place, and time. She has normal reflexes. No cranial nerve deficit. Coordination normal.  Skin: Skin is warm and dry. No rash noted. No erythema. No pallor.  Psychiatric: She has a normal mood and affect. Judgment normal.    Results for orders placed or performed in visit on 08/21/15  COMPLETE METABOLIC PANEL WITH GFR  Result Value Ref Range   Sodium 140 135 - 146 mmol/L   Potassium 4.3 3.5 - 5.3 mmol/L   Chloride 104 98 - 110 mmol/L   CO2 23 20 - 31 mmol/L   Glucose, Bld 206 (H) 65 - 99 mg/dL   BUN 21 7 - 25 mg/dL   Creat 0.76 0.50 - 0.99 mg/dL   Total Bilirubin 0.3 0.2 - 1.2 mg/dL   Alkaline Phosphatase 56 33 - 130 U/L    AST 13 10 - 35 U/L   ALT 15 6 - 29 U/L   Total Protein 7.4 6.1 - 8.1 g/dL   Albumin 4.5 3.6 - 5.1 g/dL   Calcium 9.6 8.6 - 10.4 mg/dL   GFR, Est African American >89 >=60 mL/min   GFR, Est Non African American 85 >=60 mL/min  Hemoglobin A1c  Result Value Ref Range   Hgb A1c MFr Bld 7.2 (H) <5.7 %   Mean Plasma Glucose 160 mg/dL   Chemistry (most recent): Lab Results  Component Value Date   NA 140 08/21/2015   K 4.3 08/21/2015   CL 104 08/21/2015   CO2 23 08/21/2015   BUN 21 08/21/2015   CREATININE 0.76 08/21/2015     Assessment & Plan:   1. Uncontrolled type 2 diabetes mellitus with complication, with long-term current use of insulin ([place])  Patient came with improved glucose profile, fasting EAG at  154,  A1c improved to 7.2% .   Glucose logs and insulin administration records pertaining to this visit,  to be scanned into patient's records.  Recent labs reviewed. - Patient remains at a high risk for more acute and chronic complications of diabetes which include CAD, CVA, CKD, retinopathy, and neuropathy. These are all discussed in detail with the patient.  - I have re-counseled the patient on diet management and weight loss  by adopting a carbohydrate restricted / protein rich  Diet. - Patient is advised to stick to a routine mealtimes to eat 3 meals  a day and avoid unnecessary snacks ( to snack only to correct hypoglycemia).  - Suggestion is made for patient to avoid simple carbohydrates   from their diet including Cakes , Desserts, Ice Cream,  Soda (  diet and regular) , Sweet Tea , Candies,  Chips, Cookies, Artificial Sweeteners,   and "Sugar-free" Products .  This will help patient to have stable blood glucose profile and potentially avoid unintended  Weight gain.  - The patient  has been  scheduled with Jearld Fenton, RDN, CDE for individualized DM education. - I have approached patient with the following individualized plan to manage diabetes and patient agrees.  -  Lower Lantus to 40  units qhs, associated with monitoring of BG QAM.  -Adjustment parameters for hypo and hyperglycemia were given in a written document to patient.  -Patient is encouraged to call clinic for blood glucose levels less than 70 or above 300 mg /dl. -I will continue MTF 1gm po BID, and Invokana 100mg  po qam.  - Patient will be considered for incretin therapy as appropriate next visit. - Patient specific target  for A1c; LDL, HDL, Triglycerides, and  Waist Circumference were discussed in detail.  2) BP/HTN: Controlled. Continue current medications including ACEI. 3) Lipids/HPL:  continue statins. 4)  Weight/Diet: CDE consult in progress, exercise, and carbohydrates information provided.  5) Chronic Care/Health Maintenance:  -Patient  on ACEI and Statin medications and encouraged to continue to follow up with Ophthalmology, Podiatrist at least yearly or according to recommendations, and advised to  stay away from smoking. I have recommended yearly flu vaccine and pneumonia vaccination at least every 5 years; moderate intensity exercise for up to 150 minutes weekly; and  sleep for at least 7 hours a day.  I advised patient to maintain close follow up with their PCP for primary care needs.  Patient is asked to bring meter and  blood glucose logs during their next visit.   Follow up plan: Return in about 3 months (around 12/02/2015) for follow up with pre-visit labs, meter, and logs.  Glade Lloyd, MD Phone: [phone]  Fax: [phone]   09/01/2015, 4:02 PM

## 2015-12-01 ENCOUNTER — Other Ambulatory Visit: Payer: Self-pay | Admitting: "Endocrinology

## 2015-12-04 ENCOUNTER — Other Ambulatory Visit: Payer: Self-pay | Admitting: "Endocrinology

## 2015-12-04 LAB — COMPLETE METABOLIC PANEL WITH GFR
ALT: 16 U/L (ref 6–29)
AST: 14 U/L (ref 10–35)
Albumin: 4.3 g/dL (ref 3.6–5.1)
Alkaline Phosphatase: 55 U/L (ref 33–130)
BUN: 17 mg/dL (ref 7–25)
CALCIUM: 9.9 mg/dL (ref 8.6–10.4)
CHLORIDE: 104 mmol/L (ref 98–110)
CO2: 25 mmol/L (ref 20–31)
CREATININE: 0.81 mg/dL (ref 0.50–0.99)
GFR, Est Non African American: 78 mL/min (ref >=60)
Glucose, Bld: 178 mg/dL — ABNORMAL HIGH (ref 65–99)
POTASSIUM: 4.5 mmol/L (ref 3.5–5.3)
Sodium: 139 mmol/L (ref 135–146)
Total Bilirubin: 0.3 mg/dL (ref 0.2–1.2)
Total Protein: 6.9 g/dL (ref 6.1–8.1)

## 2015-12-05 LAB — HEMOGLOBIN A1C
HEMOGLOBIN A1C: 7.3 % — AB (ref <5.7)
MEAN PLASMA GLUCOSE: 163 mg/dL

## 2015-12-07 ENCOUNTER — Ambulatory Visit (INDEPENDENT_AMBULATORY_CARE_PROVIDER_SITE_OTHER): Payer: Medicare PPO | Admitting: "Endocrinology

## 2015-12-07 ENCOUNTER — Encounter: Payer: Self-pay | Admitting: "Endocrinology

## 2015-12-07 VITALS — BP 122/76 | HR 100 | Resp 18 | Ht 67.0 in | Wt 204.0 lb

## 2015-12-07 DIAGNOSIS — E782 Mixed hyperlipidemia: Secondary | ICD-10-CM

## 2015-12-07 DIAGNOSIS — E1165 Type 2 diabetes mellitus with hyperglycemia: Secondary | ICD-10-CM | POA: Diagnosis not present

## 2015-12-07 DIAGNOSIS — Z794 Long term (current) use of insulin: Secondary | ICD-10-CM | POA: Diagnosis not present

## 2015-12-07 DIAGNOSIS — E118 Type 2 diabetes mellitus with unspecified complications: Secondary | ICD-10-CM | POA: Diagnosis not present

## 2015-12-07 DIAGNOSIS — IMO0002 Reserved for concepts with insufficient information to code with codable children: Secondary | ICD-10-CM

## 2015-12-07 DIAGNOSIS — I10 Essential (primary) hypertension: Secondary | ICD-10-CM | POA: Diagnosis not present

## 2015-12-07 MED ORDER — ACCU-CHEK SOFT TOUCH LANCETS MISC: 12 refills | Status: DC

## 2015-12-07 NOTE — Progress Notes (Signed)
Subjective:    Patient ID: Melanie Schultz, female    DOB: 10-Nov-1955,    Past Medical History:  Diagnosis Date  . Diabetes mellitus without complication ([place])   . Multiple sclerosis ([place])    Past Surgical History:  Procedure Laterality Date  . CESAREAN SECTION    . NASAL SEPTUM SURGERY     Social History   Social History  . Marital status: Divorced    Spouse name: N/A  . Number of children: N/A  . Years of education: N/A   Social History Main Topics  . Smoking status: Former Research scientist (life sciences)  . Smokeless tobacco: Never Used  . Alcohol use No  . Drug use: No  . Sexual activity: Not Asked   Other Topics Concern  . None   Social History Narrative  . None   Outpatient Encounter Prescriptions as of 12/07/2015  Medication Sig  . amLODipine (NORVASC) 5 MG tablet Take 5 mg by mouth daily.  Marland Kitchen atenolol (TENORMIN) 50 MG tablet Take 50 mg by mouth daily.  Marland Kitchen atorvastatin (LIPITOR) 80 MG tablet Take 80 mg by mouth daily.  . B-D ULTRAFINE III SHORT PEN 31G X 8 MM MISC USE AS DIRECTED  . canagliflozin (INVOKANA) 100 MG TABS tablet Take 1 tablet (100 mg total) by mouth daily.  . Insulin Pen Needle (B-D ULTRAFINE III SHORT PEN) 31G X 8 MM MISC 1 each by Does not apply route as directed.  . INVOKANA 100 MG TABS tablet TAKE ONE TABLET BY MOUTH ONCE DAILY  . Lancets (ACCU-CHEK SOFT TOUCH) lancets Use as instructed for once daily testing  . LANTUS SOLOSTAR 100 UNIT/ML Solostar Pen INJECT 46 UNITS SUBCUTANEOUSLY AT BEDTIME  . metFORMIN (GLUCOPHAGE) 1000 MG tablet Take 1 tablet (1,000 mg total) by mouth 2 (two) times daily with a meal.  . methylphenidate (DAYTRANA) 20 MG/9HR Place 1 patch onto the skin daily. wear patch for 9 hours only each day  . omeprazole (PRILOSEC) 20 MG capsule Take 20 mg by mouth daily.  . Teriflunomide 14 MG TABS Take by mouth.  . Vitamin D, Ergocalciferol, (DRISDOL) 50000 units CAPS capsule Take 50,000 Units by mouth every 7 (seven) days.   No facility-administered  encounter medications on file as of 12/07/2015.    ALLERGIES: No Known Allergies VACCINATION STATUS:  There is no immunization history on file for this patient.  Diabetes  She presents for her follow-up diabetic visit. She has type 2 diabetes mellitus. Onset time: She was diagnosed at approximate age of 45 years. Her disease course has been stable. There are no hypoglycemic associated symptoms. Pertinent negatives for hypoglycemia include no confusion, headaches, pallor or seizures. There are no diabetic associated symptoms. Pertinent negatives for diabetes include no chest pain, no fatigue, no polydipsia, no polyphagia and no polyuria. There are no hypoglycemic complications. Symptoms are stable. There are no diabetic complications. Risk factors for coronary artery disease include dyslipidemia, hypertension and obesity. Current diabetic treatment includes insulin injections and oral agent (dual therapy). She is compliant with treatment most of the time. She is following a generally unhealthy diet. She has had a previous visit with a dietitian. She participates in exercise intermittently. Her home blood glucose trend is decreasing steadily. Her breakfast blood glucose range is generally 140-180 mg/dl. Her overall blood glucose range is 140-180 mg/dl. An ACE inhibitor/angiotensin II receptor blocker is being taken. Eye exam is current.  Hyperlipidemia  This is a chronic problem. The current episode started more than 1 year ago.  Pertinent negatives include no chest pain, myalgias or shortness of breath. Current antihyperlipidemic treatment includes statins.  Hypertension  This is a chronic problem. The current episode started more than 1 year ago. Pertinent negatives include no chest pain, headaches, palpitations or shortness of breath. Past treatments include ACE inhibitors.     Review of Systems  Constitutional: Negative for fatigue and unexpected weight change.  HENT: Negative for trouble  swallowing and voice change.   Eyes: Negative for visual disturbance.  Respiratory: Negative for cough, shortness of breath and wheezing.   Cardiovascular: Negative for chest pain, palpitations and leg swelling.  Gastrointestinal: Negative for diarrhea, nausea and vomiting.  Endocrine: Negative for cold intolerance, heat intolerance, polydipsia, polyphagia and polyuria.  Musculoskeletal: Negative for arthralgias and myalgias.  Skin: Negative for color change, pallor, rash and wound.  Neurological: Negative for seizures and headaches.  Psychiatric/Behavioral: Negative for confusion and suicidal ideas.    Objective:    BP 122/76   Pulse 100   Resp 18   Ht 5\' 7"  (1.702 m)   Wt 204 lb (92.5 kg)   SpO2 96%   BMI 31.95 kg/m   Wt Readings from Last 3 Encounters:  12/07/15 204 lb (92.5 kg)  09/01/15 202 lb (91.6 kg)  06/02/15 204 lb (92.5 kg)    Physical Exam  Constitutional: She is oriented to person, place, and time. She appears well-developed.  HENT:  Head: Normocephalic and atraumatic.  Eyes: EOM are normal.  Neck: Normal range of motion. Neck supple. No tracheal deviation present. No thyromegaly present.  Cardiovascular: Normal rate and regular rhythm.   Pulmonary/Chest: Effort normal and breath sounds normal.  Abdominal: Soft. Bowel sounds are normal. There is no tenderness. There is no guarding.  Musculoskeletal: Normal range of motion. She exhibits no edema.  Neurological: She is alert and oriented to person, place, and time. She has normal reflexes. No cranial nerve deficit. Coordination normal.  Skin: Skin is warm and dry. No rash noted. No erythema. No pallor.  Psychiatric: She has a normal mood and affect. Judgment normal.    Results for orders placed or performed in visit on 12/04/15  COMPLETE METABOLIC PANEL WITH GFR  Result Value Ref Range   Sodium 139 135 - 146 mmol/L   Potassium 4.5 3.5 - 5.3 mmol/L   Chloride 104 98 - 110 mmol/L   CO2 25 20 - 31 mmol/L    Glucose, Bld 178 (H) 65 - 99 mg/dL   BUN 17 7 - 25 mg/dL   Creat 0.81 0.50 - 0.99 mg/dL   Total Bilirubin 0.3 0.2 - 1.2 mg/dL   Alkaline Phosphatase 55 33 - 130 U/L   AST 14 10 - 35 U/L   ALT 16 6 - 29 U/L   Total Protein 6.9 6.1 - 8.1 g/dL   Albumin 4.3 3.6 - 5.1 g/dL   Calcium 9.9 8.6 - 10.4 mg/dL   GFR, Est African American >89 >=60 mL/min   GFR, Est Non African American 78 >=60 mL/min  Hemoglobin A1c  Result Value Ref Range   Hgb A1c MFr Bld 7.3 (H) <5.7 %   Mean Plasma Glucose 163 mg/dL   Chemistry (most recent): Lab Results  Component Value Date   NA 139 12/04/2015   K 4.5 12/04/2015   CL 104 12/04/2015   CO2 25 12/04/2015   BUN 17 12/04/2015   CREATININE 0.81 12/04/2015     Assessment & Plan:   1. Uncontrolled type 2 diabetes mellitus with complication, with  long-term current use of insulin ([place])  Patient came with improved glucose profile, fasting EAG at  154,  A1c improved to 7.3% .   Glucose logs and insulin administration records pertaining to this visit,  to be scanned into patient's records.  Recent labs reviewed. - Patient remains at a high risk for more acute and chronic complications of diabetes which include CAD, CVA, CKD, retinopathy, and neuropathy. These are all discussed in detail with the patient.  - I have re-counseled the patient on diet management and weight loss  by adopting a carbohydrate restricted / protein rich  Diet. - Patient is advised to stick to a routine mealtimes to eat 3 meals  a day and avoid unnecessary snacks ( to snack only to correct hypoglycemia).  - Suggestion is made for patient to avoid simple carbohydrates   from their diet including Cakes , Desserts, Ice Cream,  Soda (  diet and regular) , Sweet Tea , Candies,  Chips, Cookies, Artificial Sweeteners,   and "Sugar-free" Products .  This will help patient to have stable blood glucose profile and potentially avoid unintended  Weight gain.  - The patient  has been  scheduled with  Jearld Fenton, RDN, CDE for individualized DM education. - I have approached patient with the following individualized plan to manage diabetes and patient agrees.  - Continue  Lantus  40  units qhs, associated with monitoring of BG QAM.  -Adjustment parameters for hypo and hyperglycemia were given in a written document to patient.  -Patient is encouraged to call clinic for blood glucose levels less than 70 or above 300 mg /dl. -I will continue MTF 1gm po BID, and Invokana 100mg  po qam.  - Patient will be considered for incretin therapy as appropriate next visit. - Patient specific target  for A1c; LDL, HDL, Triglycerides, and  Waist Circumference were discussed in detail.  2) BP/HTN: Controlled. Continue current medications including ACEI. 3) Lipids/HPL:  continue statins. 4)  Weight/Diet: CDE consult in progress, exercise, and carbohydrates information provided.  5) Chronic Care/Health Maintenance:  -Patient  on ACEI and Statin medications and encouraged to continue to follow up with Ophthalmology, Podiatrist at least yearly or according to recommendations, and advised to  stay away from smoking. I have recommended yearly flu vaccine and pneumonia vaccination at least every 5 years; moderate intensity exercise for up to 150 minutes weekly; and  sleep for at least 7 hours a day.  I advised patient to maintain close follow up with their PCP for primary care needs.  Patient is asked to bring meter and  blood glucose logs during their next visit.   Follow up plan: Return in about 3 months (around 03/08/2016) for follow up with pre-visit labs, meter, and logs.  Glade Lloyd, MD Phone: [phone]  Fax: [phone]   12/07/2015, 3:53 PM

## 2015-12-21 ENCOUNTER — Other Ambulatory Visit: Payer: Self-pay

## 2015-12-21 MED ORDER — INSULIN PEN NEEDLE 31G X 8 MM MISC
5 refills | Status: DC
Start: 2015-12-21 — End: 2016-02-12

## 2016-01-15 ENCOUNTER — Other Ambulatory Visit: Payer: Self-pay | Admitting: "Endocrinology

## 2016-01-20 ENCOUNTER — Other Ambulatory Visit: Payer: Self-pay | Admitting: "Endocrinology

## 2016-02-12 ENCOUNTER — Ambulatory Visit (INDEPENDENT_AMBULATORY_CARE_PROVIDER_SITE_OTHER): Payer: Medicare PPO | Admitting: "Endocrinology

## 2016-02-12 ENCOUNTER — Encounter: Payer: Self-pay | Admitting: "Endocrinology

## 2016-02-12 DIAGNOSIS — IMO0002 Reserved for concepts with insufficient information to code with codable children: Secondary | ICD-10-CM

## 2016-02-12 DIAGNOSIS — I10 Essential (primary) hypertension: Secondary | ICD-10-CM | POA: Diagnosis not present

## 2016-02-12 DIAGNOSIS — E118 Type 2 diabetes mellitus with unspecified complications: Secondary | ICD-10-CM

## 2016-02-12 DIAGNOSIS — Z794 Long term (current) use of insulin: Secondary | ICD-10-CM

## 2016-02-12 DIAGNOSIS — E782 Mixed hyperlipidemia: Secondary | ICD-10-CM | POA: Diagnosis not present

## 2016-02-12 DIAGNOSIS — E1165 Type 2 diabetes mellitus with hyperglycemia: Secondary | ICD-10-CM | POA: Diagnosis not present

## 2016-02-12 NOTE — Progress Notes (Signed)
Subjective:    Patient ID: Melanie Schultz, female    DOB: 02/22/1955,    Past Medical History:  Diagnosis Date  . Diabetes mellitus without complication ([place])   . Multiple sclerosis ([place])    Past Surgical History:  Procedure Laterality Date  . CESAREAN SECTION    . NASAL SEPTUM SURGERY     Social History   Social History  . Marital status: Divorced    Spouse name: N/A  . Number of children: N/A  . Years of education: N/A   Social History Main Topics  . Smoking status: Former Research scientist (life sciences)  . Smokeless tobacco: Never Used  . Alcohol use No  . Drug use: No  . Sexual activity: Not Asked   Other Topics Concern  . None   Social History Narrative  . None   Outpatient Encounter Prescriptions as of 02/12/2016  Medication Sig  . ACCU-CHEK AVIVA PLUS test strip CHECK BLOOD GLUCOSE TWICE DAILY  . amLODipine (NORVASC) 5 MG tablet Take 5 mg by mouth daily.  Marland Kitchen atenolol (TENORMIN) 50 MG tablet Take 50 mg by mouth daily.  Marland Kitchen atorvastatin (LIPITOR) 80 MG tablet Take 80 mg by mouth daily.  . canagliflozin (INVOKANA) 100 MG TABS tablet Take 1 tablet (100 mg total) by mouth daily.  . Insulin Pen Needle (B-D ULTRAFINE III SHORT PEN) 31G X 8 MM MISC 1 each by Does not apply route as directed.  . Lancets (ACCU-CHEK SOFT TOUCH) lancets Use as instructed for once daily testing  . LANTUS SOLOSTAR 100 UNIT/ML Solostar Pen INJECT 46 UNITS SUB-Q AT BEDTIME (Patient taking differently: INJECT 40 UNITS SUB-Q AT BEDTIME)  . metFORMIN (GLUCOPHAGE) 1000 MG tablet Take 1 tablet (1,000 mg total) by mouth 2 (two) times daily with a meal.  . methylphenidate (DAYTRANA) 20 MG/9HR Place 1 patch onto the skin daily. wear patch for 9 hours only each day  . omeprazole (PRILOSEC) 20 MG capsule Take 20 mg by mouth daily.  . Teriflunomide 14 MG TABS Take by mouth.  . Vitamin D, Ergocalciferol, (DRISDOL) 50000 units CAPS capsule Take 50,000 Units by mouth every 7 (seven) days.  . [DISCONTINUED] B-D ULTRAFINE III SHORT  PEN 31G X 8 MM MISC USE AS DIRECTED  . [DISCONTINUED] Insulin Pen Needle (RELION PEN NEEDLE 31G/8MM) 31G X 8 MM MISC Use 1 daily as directed  . [DISCONTINUED] INVOKANA 100 MG TABS tablet TAKE ONE TABLET BY MOUTH ONCE DAILY   No facility-administered encounter medications on file as of 02/12/2016.    ALLERGIES: No Known Allergies VACCINATION STATUS:  There is no immunization history on file for this patient.  Diabetes  She presents for her follow-up diabetic visit. She has type 2 diabetes mellitus. Onset time: She was diagnosed at approximate age of 62 years. Her disease course has been stable. There are no hypoglycemic associated symptoms. Pertinent negatives for hypoglycemia include no confusion, headaches, pallor or seizures. There are no diabetic associated symptoms. Pertinent negatives for diabetes include no chest pain, no fatigue, no polydipsia, no polyphagia and no polyuria. There are no hypoglycemic complications. Symptoms are stable. There are no diabetic complications. Risk factors for coronary artery disease include dyslipidemia, hypertension and obesity. Current diabetic treatment includes insulin injections and oral agent (dual therapy). She is compliant with treatment most of the time. She is following a generally unhealthy diet. She has had a previous visit with a dietitian. She participates in exercise intermittently. There is no change in her home blood glucose trend. Her breakfast  blood glucose range is generally 130-140 mg/dl. Her overall blood glucose range is 130-140 mg/dl. An ACE inhibitor/angiotensin II receptor blocker is being taken. Eye exam is current.  Hyperlipidemia  This is a chronic problem. The current episode started more than 1 year ago. Pertinent negatives include no chest pain, myalgias or shortness of breath. Current antihyperlipidemic treatment includes statins.  Hypertension  This is a chronic problem. The current episode started more than 1 year ago. Pertinent  negatives include no chest pain, headaches, palpitations or shortness of breath. Past treatments include ACE inhibitors.     Review of Systems  Constitutional: Negative for fatigue and unexpected weight change.  HENT: Negative for trouble swallowing and voice change.   Eyes: Negative for visual disturbance.  Respiratory: Negative for cough, shortness of breath and wheezing.   Cardiovascular: Negative for chest pain, palpitations and leg swelling.  Gastrointestinal: Negative for diarrhea, nausea and vomiting.  Endocrine: Negative for cold intolerance, heat intolerance, polydipsia, polyphagia and polyuria.  Musculoskeletal: Negative for arthralgias and myalgias.  Skin: Negative for color change, pallor, rash and wound.  Neurological: Negative for seizures and headaches.  Psychiatric/Behavioral: Negative for confusion and suicidal ideas.    Objective:    There were no vitals taken for this visit.  Wt Readings from Last 3 Encounters:  12/07/15 204 lb (92.5 kg)  09/01/15 202 lb (91.6 kg)  06/02/15 204 lb (92.5 kg)    Physical Exam  Constitutional: She is oriented to person, place, and time. She appears well-developed.  HENT:  Head: Normocephalic and atraumatic.  Eyes: EOM are normal.  Neck: Normal range of motion. Neck supple. No tracheal deviation present. No thyromegaly present.  Cardiovascular: Normal rate and regular rhythm.   Pulmonary/Chest: Effort normal and breath sounds normal.  Abdominal: Soft. Bowel sounds are normal. There is no tenderness. There is no guarding.  Musculoskeletal: Normal range of motion. She exhibits no edema.  Neurological: She is alert and oriented to person, place, and time. She has normal reflexes. No cranial nerve deficit. Coordination normal.  Skin: Skin is warm and dry. No rash noted. No erythema. No pallor.  Psychiatric: She has a normal mood and affect. Judgment normal.    Results for orders placed or performed in visit on 12/04/15  COMPLETE  METABOLIC PANEL WITH GFR  Result Value Ref Range   Sodium 139 135 - 146 mmol/L   Potassium 4.5 3.5 - 5.3 mmol/L   Chloride 104 98 - 110 mmol/L   CO2 25 20 - 31 mmol/L   Glucose, Bld 178 (H) 65 - 99 mg/dL   BUN 17 7 - 25 mg/dL   Creat 0.81 0.50 - 0.99 mg/dL   Total Bilirubin 0.3 0.2 - 1.2 mg/dL   Alkaline Phosphatase 55 33 - 130 U/L   AST 14 10 - 35 U/L   ALT 16 6 - 29 U/L   Total Protein 6.9 6.1 - 8.1 g/dL   Albumin 4.3 3.6 - 5.1 g/dL   Calcium 9.9 8.6 - 10.4 mg/dL   GFR, Est African American >89 >=60 mL/min   GFR, Est Non African American 78 >=60 mL/min  Hemoglobin A1c  Result Value Ref Range   Hgb A1c MFr Bld 7.3 (H) <5.7 %   Mean Plasma Glucose 163 mg/dL   Chemistry (most recent): Lab Results  Component Value Date   NA 139 12/04/2015   K 4.5 12/04/2015   CL 104 12/04/2015   CO2 25 12/04/2015   BUN 17 12/04/2015   CREATININE 0.81  12/04/2015     Assessment & Plan:   1. Uncontrolled type 2 diabetes mellitus with complication, with long-term current use of insulin ([place])  Patient came with An trolled glucose profile, fasting EAG at  130,  A1c improved to 7.3% . - She was advised to see me by her PMD due to recent glycosuria.   Glucose logs and insulin administration records pertaining to this visit,  to be scanned into patient's records.  Recent labs reviewed. - Patient remains at a high risk for more acute and chronic complications of diabetes which include CAD, CVA, CKD, retinopathy, and neuropathy. These are all discussed in detail with the patient.  - I have re-counseled the patient on diet management and weight loss  by adopting a carbohydrate restricted / protein rich  Diet. - Patient is advised to stick to a routine mealtimes to eat 3 meals  a day and avoid unnecessary snacks ( to snack only to correct hypoglycemia).  - Suggestion is made for patient to avoid simple carbohydrates   from their diet including Cakes , Desserts, Ice Cream,  Soda (  diet and regular) ,  Sweet Tea , Candies,  Chips, Cookies, Artificial Sweeteners,   and "Sugar-free" Products .  This will help patient to have stable blood glucose profile and potentially avoid unintended  Weight gain.  - The patient  has been  scheduled with Jearld Fenton, RDN, CDE for individualized DM education. - I have approached patient with the following individualized plan to manage diabetes and patient agrees.  -  Her recent glycosuria is mainly due to her invokana use. - Her diabetes is under control, average blood glucose of 134 fasting. Her most recent A1c was 7.3%. Continue  Lantus  40  units qhs, associated with monitoring of BG QAM.  -Patient is encouraged to call clinic for blood glucose levels less than 70 or above 300 mg /dl. -I will continue MTF 1gm po BID, and Invokana 100mg  po qam.  - Patient will be considered for incretin therapy as appropriate next visit. - Patient specific target  for A1c; LDL, HDL, Triglycerides, and  Waist Circumference were discussed in detail.  2) BP/HTN: Controlled. Continue current medications including ACEI. 3) Lipids/HPL:  continue statins. 4)  Weight/Diet: CDE consult in progress, exercise, and carbohydrates information provided.  5) Chronic Care/Health Maintenance:  -Patient  on ACEI and Statin medications and encouraged to continue to follow up with Ophthalmology, Podiatrist at least yearly or according to recommendations, and advised to  stay away from smoking. I have recommended yearly flu vaccine and pneumonia vaccination at least every 5 years; moderate intensity exercise for up to 150 minutes weekly; and  sleep for at least 7 hours a day.  I advised patient to maintain close follow up with their PCP for primary care needs.  Patient is asked to bring meter and  blood glucose logs during their next visit.   Follow up plan: Return in about 3 months (around 05/12/2016) for follow up with pre-visit labs, meter, and logs.  Glade Lloyd, MD Phone: [phone]   Fax: [phone]   02/12/2016, 10:55 AM

## 2016-02-17 ENCOUNTER — Other Ambulatory Visit: Payer: Self-pay | Admitting: "Endocrinology

## 2016-03-12 ENCOUNTER — Ambulatory Visit: Payer: Medicare PPO | Admitting: "Endocrinology

## 2016-05-06 ENCOUNTER — Other Ambulatory Visit: Payer: Self-pay | Admitting: "Endocrinology

## 2016-05-06 LAB — COMPREHENSIVE METABOLIC PANEL
ALK PHOS: 61 U/L (ref 33–130)
ALT: 17 U/L (ref 6–29)
AST: 15 U/L (ref 10–35)
Albumin: 4.6 g/dL (ref 3.6–5.1)
BILIRUBIN TOTAL: 0.3 mg/dL (ref 0.2–1.2)
BUN: 19 mg/dL (ref 7–25)
CHLORIDE: 104 mmol/L (ref 98–110)
CO2: 24 mmol/L (ref 20–31)
CREATININE: 0.7 mg/dL (ref 0.50–0.99)
Calcium: 9.9 mg/dL (ref 8.6–10.4)
Glucose, Bld: 197 mg/dL — ABNORMAL HIGH (ref 65–99)
POTASSIUM: 4.4 mmol/L (ref 3.5–5.3)
SODIUM: 140 mmol/L (ref 135–146)
TOTAL PROTEIN: 7.1 g/dL (ref 6.1–8.1)

## 2016-05-07 LAB — HEMOGLOBIN A1C W/OUT EAG: HEMOGLOBIN A1C: 7.3 % — AB (ref <5.7)

## 2016-05-08 ENCOUNTER — Other Ambulatory Visit: Payer: Self-pay | Admitting: "Endocrinology

## 2016-05-13 ENCOUNTER — Ambulatory Visit (INDEPENDENT_AMBULATORY_CARE_PROVIDER_SITE_OTHER): Payer: Medicare PPO | Admitting: "Endocrinology

## 2016-05-13 ENCOUNTER — Encounter: Payer: Self-pay | Admitting: "Endocrinology

## 2016-05-13 VITALS — BP 130/84 | HR 80 | Ht 67.0 in | Wt 204.0 lb

## 2016-05-13 DIAGNOSIS — E782 Mixed hyperlipidemia: Secondary | ICD-10-CM

## 2016-05-13 DIAGNOSIS — Z794 Long term (current) use of insulin: Secondary | ICD-10-CM | POA: Diagnosis not present

## 2016-05-13 DIAGNOSIS — E118 Type 2 diabetes mellitus with unspecified complications: Secondary | ICD-10-CM

## 2016-05-13 DIAGNOSIS — IMO0002 Reserved for concepts with insufficient information to code with codable children: Secondary | ICD-10-CM

## 2016-05-13 DIAGNOSIS — E1165 Type 2 diabetes mellitus with hyperglycemia: Secondary | ICD-10-CM | POA: Diagnosis not present

## 2016-05-13 DIAGNOSIS — I10 Essential (primary) hypertension: Secondary | ICD-10-CM

## 2016-05-13 MED ORDER — FLUCONAZOLE 150 MG PO TABS
150.0000 mg | ORAL_TABLET | ORAL | 0 refills | Status: DC | PRN
Start: 2016-05-13 — End: 2017-01-01

## 2016-05-13 NOTE — Progress Notes (Signed)
Subjective:    Patient ID: Melanie Schultz, female    DOB: [birth year],    Past Medical History:  Diagnosis Date  . Diabetes mellitus without complication ([place])   . Multiple sclerosis ([place])    Past Surgical History:  Procedure Laterality Date  . CESAREAN SECTION    . NASAL SEPTUM SURGERY     Social History   Social History  . Marital status: Divorced    Spouse name: N/A  . Number of children: N/A  . Years of education: N/A   Social History Main Topics  . Smoking status: Former Research scientist (life sciences)  . Smokeless tobacco: Never Used  . Alcohol use No  . Drug use: No  . Sexual activity: Not Asked   Other Topics Concern  . None   Social History Narrative  . None   Outpatient Encounter Prescriptions as of 05/13/2016  Medication Sig  . ACCU-CHEK AVIVA PLUS test strip CHECK BLOOD GLUCOSE TWICE DAILY  . amLODipine (NORVASC) 5 MG tablet Take 5 mg by mouth daily.  Marland Kitchen atenolol (TENORMIN) 50 MG tablet Take 50 mg by mouth daily.  Marland Kitchen atorvastatin (LIPITOR) 80 MG tablet Take 80 mg by mouth daily.  . canagliflozin (INVOKANA) 100 MG TABS tablet Take 1 tablet (100 mg total) by mouth daily.  . fluconazole (DIFLUCAN) 150 MG tablet Take 1 tablet (150 mg total) by mouth continuous as needed.  . Insulin Pen Needle (B-D ULTRAFINE III SHORT PEN) 31G X 8 MM MISC 1 each by Does not apply route as directed.  . Lancets (ACCU-CHEK SOFT TOUCH) lancets Use as instructed for once daily testing  . LANTUS SOLOSTAR 100 UNIT/ML Solostar Pen INJECT 46 UNITS SUB-Q AT BEDTIME  . metFORMIN (GLUCOPHAGE) 1000 MG tablet TAKE ONE TABLET BY MOUTH TWICE DAILY WITH MEALS  . methylphenidate (DAYTRANA) 20 MG/9HR Place 1 patch onto the skin daily. wear patch for 9 hours only each day  . omeprazole (PRILOSEC) 20 MG capsule Take 20 mg by mouth daily.  . Teriflunomide 14 MG TABS Take by mouth.  . Vitamin D, Ergocalciferol, (DRISDOL) 50000 units CAPS capsule Take 50,000 Units by mouth every 7 (seven) days.   No facility-administered  encounter medications on file as of 05/13/2016.    ALLERGIES: No Known Allergies VACCINATION STATUS:  There is no immunization history on file for this patient.  Diabetes  She presents for her follow-up diabetic visit. She has type 2 diabetes mellitus. Onset time: She was diagnosed at approximate age of 62 years. Her disease course has been stable. There are no hypoglycemic associated symptoms. Pertinent negatives for hypoglycemia include no confusion, headaches, pallor or seizures. There are no diabetic associated symptoms. Pertinent negatives for diabetes include no chest pain, no fatigue, no polydipsia, no polyphagia and no polyuria. There are no hypoglycemic complications. Symptoms are stable. There are no diabetic complications. Risk factors for coronary artery disease include dyslipidemia, hypertension and obesity. Current diabetic treatment includes insulin injections and oral agent (dual therapy). She is compliant with treatment most of the time. She is following a generally unhealthy diet. She has had a previous visit with a dietitian. She participates in exercise intermittently. There is no change in her home blood glucose trend. Her breakfast blood glucose range is generally 130-140 mg/dl. Her overall blood glucose range is 130-140 mg/dl. An ACE inhibitor/angiotensin II receptor blocker is being taken. Eye exam is current.  Hyperlipidemia  This is a chronic problem. The current episode started more than 1 year ago. Pertinent negatives include  no chest pain, myalgias or shortness of breath. Current antihyperlipidemic treatment includes statins.  Hypertension  This is a chronic problem. The current episode started more than 1 year ago. Pertinent negatives include no chest pain, headaches, palpitations or shortness of breath. Past treatments include ACE inhibitors.     Review of Systems  Constitutional: Negative for fatigue and unexpected weight change.  HENT: Negative for trouble swallowing  and voice change.   Eyes: Negative for visual disturbance.  Respiratory: Negative for cough, shortness of breath and wheezing.   Cardiovascular: Negative for chest pain, palpitations and leg swelling.  Gastrointestinal: Negative for diarrhea, nausea and vomiting.  Endocrine: Negative for cold intolerance, heat intolerance, polydipsia, polyphagia and polyuria.  Musculoskeletal: Negative for arthralgias and myalgias.  Skin: Negative for color change, pallor, rash and wound.  Neurological: Negative for seizures and headaches.  Psychiatric/Behavioral: Negative for confusion and suicidal ideas.    Objective:    BP 130/84   Pulse 80   Ht 5\' 7"  (1.702 m)   Wt 204 lb (92.5 kg)   BMI 31.95 kg/m   Wt Readings from Last 3 Encounters:  05/13/16 204 lb (92.5 kg)  12/07/15 204 lb (92.5 kg)  09/01/15 202 lb (91.6 kg)    Physical Exam  Constitutional: She is oriented to person, place, and time. She appears well-developed.  HENT:  Head: Normocephalic and atraumatic.  Eyes: EOM are normal.  Neck: Normal range of motion. Neck supple. No tracheal deviation present. No thyromegaly present.  Cardiovascular: Normal rate and regular rhythm.   Pulmonary/Chest: Effort normal and breath sounds normal.  Abdominal: Soft. Bowel sounds are normal. There is no tenderness. There is no guarding.  Musculoskeletal: Normal range of motion. She exhibits no edema.  Neurological: She is alert and oriented to person, place, and time. She has normal reflexes. No cranial nerve deficit. Coordination normal.  Skin: Skin is warm and dry. No rash noted. No erythema. No pallor.  Psychiatric: She has a normal mood and affect. Judgment normal.    Results for orders placed or performed in visit on 05/06/16  Comprehensive metabolic panel  Result Value Ref Range   Sodium 140 135 - 146 mmol/L   Potassium 4.4 3.5 - 5.3 mmol/L   Chloride 104 98 - 110 mmol/L   CO2 24 20 - 31 mmol/L   Glucose, Bld 197 (H) 65 - 99 mg/dL   BUN  19 7 - 25 mg/dL   Creat 0.70 0.50 - 0.99 mg/dL   Total Bilirubin 0.3 0.2 - 1.2 mg/dL   Alkaline Phosphatase 61 33 - 130 U/L   AST 15 10 - 35 U/L   ALT 17 6 - 29 U/L   Total Protein 7.1 6.1 - 8.1 g/dL   Albumin 4.6 3.6 - 5.1 g/dL   Calcium 9.9 8.6 - 10.4 mg/dL  Hemoglobin A1C w/out eAG  Result Value Ref Range   Hgb A1c MFr Bld 7.3 (H) <5.7 %   Chemistry (most recent): Lab Results  Component Value Date   NA 140 05/06/2016   K 4.4 05/06/2016   CL 104 05/06/2016   CO2 24 05/06/2016   BUN 19 05/06/2016   CREATININE 0.70 05/06/2016     Assessment & Plan:   1. Uncontrolled type 2 diabetes mellitus with complication, with long-term current use of insulin ([place])  Patient came with Controlled fasting glucose profile, fasting EAG at  130,  A1c Is stable at 7.3% .   Glucose logs and insulin administration records pertaining to this  visit,  to be scanned into patient's records.  Recent labs reviewed. - Patient remains at a high risk for more acute and chronic complications of diabetes which include CAD, CVA, CKD, retinopathy, and neuropathy. These are all discussed in detail with the patient.  - I have re-counseled the patient on diet management and weight loss  by adopting a carbohydrate restricted / protein rich  Diet. - Patient is advised to stick to a routine mealtimes to eat 3 meals  a day and avoid unnecessary snacks ( to snack only to correct hypoglycemia).  - Suggestion is made for patient to avoid simple carbohydrates   from their diet including Cakes , Desserts, Ice Cream,  Soda (  diet and regular) , Sweet Tea , Candies,  Chips, Cookies, Artificial Sweeteners,   and "Sugar-free" Products .  This will help patient to have stable blood glucose profile and potentially avoid unintended  Weight gain.  - The patient  has been  scheduled with Jearld Fenton, RDN, CDE for individualized DM education. - I have approached patient with the following individualized plan to manage diabetes  and patient agrees.   - Her diabetes is under control, average blood glucose of 134 fasting. Her most recent A1c was 7.3%. Continue  Lantus  40  units qhs, associated with monitoring of BG QAM.  -Patient is encouraged to call clinic for blood glucose levels less than 70 or above 300 mg /dl. -I will continue MTF 1gm po BID, and Invokana 100mg  po qam. - I will prescribed Diflucan 150 mg for her to take when necessary with yeast infection  (3 doses).  - Patient will be considered for incretin therapy as appropriate next visit. - Patient specific target  for A1c; LDL, HDL, Triglycerides, and  Waist Circumference were discussed in detail.  2) BP/HTN: Controlled. Continue current medications including ACEI. 3) Lipids/HPL:  continue statins. 4)  Weight/Diet: CDE consult in progress, exercise, and carbohydrates information provided.  5) Chronic Care/Health Maintenance:  -Patient  on ACEI and Statin medications and encouraged to continue to follow up with Ophthalmology, Podiatrist at least yearly or according to recommendations, and advised to  stay away from smoking. I have recommended yearly flu vaccine and pneumonia vaccination at least every 5 years; moderate intensity exercise for up to 150 minutes weekly; and  sleep for at least 7 hours a day.  I advised patient to maintain close follow up with their PCP for primary care needs.  Patient is asked to bring meter and  blood glucose logs during their next visit.   Follow up plan: Return in about 4 months (around 09/12/2016) for follow up with pre-visit labs, meter, and logs.  Glade Lloyd, MD Phone: [phone]  Fax: [phone]   05/13/2016, 11:01 AM

## 2016-05-13 NOTE — Patient Instructions (Signed)

## 2016-05-27 ENCOUNTER — Other Ambulatory Visit: Payer: Self-pay | Admitting: "Endocrinology

## 2016-08-16 ENCOUNTER — Other Ambulatory Visit: Payer: Self-pay | Admitting: "Endocrinology

## 2016-09-02 ENCOUNTER — Other Ambulatory Visit: Payer: Self-pay | Admitting: "Endocrinology

## 2016-09-04 ENCOUNTER — Other Ambulatory Visit: Payer: Self-pay | Admitting: "Endocrinology

## 2016-09-05 LAB — COMPREHENSIVE METABOLIC PANEL
ALT: 16 U/L (ref 6–29)
AST: 13 U/L (ref 10–35)
Albumin: 4.2 g/dL (ref 3.6–5.1)
Alkaline Phosphatase: 61 U/L (ref 33–130)
BUN: 14 mg/dL (ref 7–25)
CHLORIDE: 104 mmol/L (ref 98–110)
CO2: 22 mmol/L (ref 20–31)
CREATININE: 0.67 mg/dL (ref 0.50–0.99)
Calcium: 9.4 mg/dL (ref 8.6–10.4)
Glucose, Bld: 184 mg/dL — ABNORMAL HIGH (ref 65–99)
POTASSIUM: 4.2 mmol/L (ref 3.5–5.3)
SODIUM: 138 mmol/L (ref 135–146)
Total Bilirubin: 0.2 mg/dL (ref 0.2–1.2)
Total Protein: 6.4 g/dL (ref 6.1–8.1)

## 2016-09-05 LAB — HEMOGLOBIN A1C
Hgb A1c MFr Bld: 7.6 % — ABNORMAL HIGH (ref <5.7)
MEAN PLASMA GLUCOSE: 171 mg/dL

## 2016-09-05 LAB — LIPID PANEL
CHOL/HDL RATIO: 5.1 ratio — AB (ref <5.0)
Cholesterol: 144 mg/dL (ref <200)
HDL: 28 mg/dL — ABNORMAL LOW (ref >50)
LDL CALC: 59 mg/dL (ref <100)
TRIGLYCERIDES: 283 mg/dL — AB (ref <150)
VLDL: 57 mg/dL — AB (ref <30)

## 2016-09-12 ENCOUNTER — Encounter: Payer: Self-pay | Admitting: "Endocrinology

## 2016-09-12 ENCOUNTER — Ambulatory Visit (INDEPENDENT_AMBULATORY_CARE_PROVIDER_SITE_OTHER): Payer: Medicare PPO | Admitting: "Endocrinology

## 2016-09-12 VITALS — BP 127/84 | HR 79 | Wt 202.0 lb

## 2016-09-12 DIAGNOSIS — E1165 Type 2 diabetes mellitus with hyperglycemia: Secondary | ICD-10-CM

## 2016-09-12 DIAGNOSIS — I10 Essential (primary) hypertension: Secondary | ICD-10-CM | POA: Diagnosis not present

## 2016-09-12 DIAGNOSIS — E782 Mixed hyperlipidemia: Secondary | ICD-10-CM | POA: Diagnosis not present

## 2016-09-12 DIAGNOSIS — Z794 Long term (current) use of insulin: Secondary | ICD-10-CM | POA: Diagnosis not present

## 2016-09-12 DIAGNOSIS — E118 Type 2 diabetes mellitus with unspecified complications: Secondary | ICD-10-CM

## 2016-09-12 DIAGNOSIS — IMO0002 Reserved for concepts with insufficient information to code with codable children: Secondary | ICD-10-CM

## 2016-09-12 MED ORDER — INSULIN GLARGINE 100 UNIT/ML SOLOSTAR PEN: PEN_INJECTOR | SUBCUTANEOUS | 2 refills | Status: DC

## 2016-09-12 NOTE — Progress Notes (Signed)
Subjective:    Patient ID: Melanie Schultz, female    DOB: January 24, 1956,    Past Medical History:  Diagnosis Date  . Diabetes mellitus without complication ([place])   . Multiple sclerosis ([place])    Past Surgical History:  Procedure Laterality Date  . CESAREAN SECTION    . NASAL SEPTUM SURGERY     Social History   Social History  . Marital status: Divorced    Spouse name: N/A  . Number of children: N/A  . Years of education: N/A   Social History Main Topics  . Smoking status: Former Research scientist (life sciences)  . Smokeless tobacco: Never Used  . Alcohol use No  . Drug use: No  . Sexual activity: Not Asked   Other Topics Concern  . None   Social History Narrative  . None   Outpatient Encounter Prescriptions as of 09/12/2016  Medication Sig  . ACCU-CHEK AVIVA PLUS test strip CHECK BLOOD GLUCOSE TWICE DAILY  . amLODipine (NORVASC) 5 MG tablet Take 5 mg by mouth daily.  Marland Kitchen atenolol (TENORMIN) 50 MG tablet Take 50 mg by mouth daily.  Marland Kitchen atorvastatin (LIPITOR) 80 MG tablet Take 80 mg by mouth daily.  . fluconazole (DIFLUCAN) 150 MG tablet Take 1 tablet (150 mg total) by mouth continuous as needed.  . Insulin Glargine (LANTUS SOLOSTAR) 100 UNIT/ML Solostar Pen INJECT 50 UNITS SUBCUTANEOUSLY AT BEDTIME  . Insulin Pen Needle (B-D ULTRAFINE III SHORT PEN) 31G X 8 MM MISC 1 each by Does not apply route as directed.  . INVOKANA 100 MG TABS tablet TAKE ONE TABLET BY MOUTH ONCE DAILY  . Lancets (ACCU-CHEK SOFT TOUCH) lancets Use as instructed for once daily testing  . metFORMIN (GLUCOPHAGE) 1000 MG tablet TAKE ONE TABLET BY MOUTH TWICE DAILY WITH MEALS  . methylphenidate (DAYTRANA) 20 MG/9HR Place 1 patch onto the skin daily. wear patch for 9 hours only each day  . omeprazole (PRILOSEC) 20 MG capsule Take 20 mg by mouth daily.  . Teriflunomide 14 MG TABS Take by mouth.  . Vitamin D, Ergocalciferol, (DRISDOL) 50000 units CAPS capsule Take 50,000 Units by mouth every 7 (seven) days.  . [DISCONTINUED] LANTUS  SOLOSTAR 100 UNIT/ML Solostar Pen INJECT 46 UNITS SUBCUTANEOUSLY AT BEDTIME   No facility-administered encounter medications on file as of 09/12/2016.    ALLERGIES: No Known Allergies VACCINATION STATUS:  There is no immunization history on file for this patient.  Diabetes  She presents for her follow-up diabetic visit. She has type 2 diabetes mellitus. Onset time: She was diagnosed at approximate age of 26 years. Her disease course has been stable. There are no hypoglycemic associated symptoms. Pertinent negatives for hypoglycemia include no confusion, headaches, pallor or seizures. There are no diabetic associated symptoms. Pertinent negatives for diabetes include no chest pain, no fatigue, no polydipsia, no polyphagia and no polyuria. There are no hypoglycemic complications. Symptoms are stable. There are no diabetic complications. Risk factors for coronary artery disease include dyslipidemia, hypertension and obesity. Current diabetic treatment includes insulin injections and oral agent (dual therapy). She is compliant with treatment most of the time. Her weight is stable. She is following a generally unhealthy diet. She has had a previous visit with a dietitian. She participates in exercise intermittently. There is no change in her home blood glucose trend. Her breakfast blood glucose range is generally 140-180 mg/dl. Her overall blood glucose range is 140-180 mg/dl. An ACE inhibitor/angiotensin II receptor blocker is being taken. Eye exam is current.  Hyperlipidemia  This is a chronic problem. The current episode started more than 1 year ago. Pertinent negatives include no chest pain, myalgias or shortness of breath. Current antihyperlipidemic treatment includes statins.  Hypertension  This is a chronic problem. The current episode started more than 1 year ago. Pertinent negatives include no chest pain, headaches, palpitations or shortness of breath. Past treatments include ACE inhibitors.      Review of Systems  Constitutional: Negative for fatigue and unexpected weight change.  HENT: Negative for trouble swallowing and voice change.   Eyes: Negative for visual disturbance.  Respiratory: Negative for cough, shortness of breath and wheezing.   Cardiovascular: Negative for chest pain, palpitations and leg swelling.  Gastrointestinal: Negative for diarrhea, nausea and vomiting.  Endocrine: Negative for cold intolerance, heat intolerance, polydipsia, polyphagia and polyuria.  Musculoskeletal: Negative for arthralgias and myalgias.  Skin: Negative for color change, pallor, rash and wound.  Neurological: Negative for seizures and headaches.  Psychiatric/Behavioral: Negative for confusion and suicidal ideas.    Objective:    BP 127/84   Pulse 79   Wt 202 lb (91.6 kg)   SpO2 98%   BMI 31.64 kg/m   Wt Readings from Last 3 Encounters:  09/12/16 202 lb (91.6 kg)  05/13/16 204 lb (92.5 kg)  12/07/15 204 lb (92.5 kg)    Physical Exam  Constitutional: She is oriented to person, place, and time. She appears well-developed.  HENT:  Head: Normocephalic and atraumatic.  Eyes: EOM are normal.  Neck: Normal range of motion. Neck supple. No tracheal deviation present. No thyromegaly present.  Cardiovascular: Normal rate and regular rhythm.   Pulmonary/Chest: Effort normal and breath sounds normal.  Abdominal: Soft. Bowel sounds are normal. There is no tenderness. There is no guarding.  Musculoskeletal: Normal range of motion. She exhibits no edema.  Neurological: She is alert and oriented to person, place, and time. She has normal reflexes. No cranial nerve deficit. Coordination normal.  Skin: Skin is warm and dry. No rash noted. No erythema. No pallor.  Psychiatric: She has a normal mood and affect. Judgment normal.    Results for orders placed or performed in visit on 09/04/16  Comprehensive metabolic panel  Result Value Ref Range   Sodium 138 135 - 146 mmol/L    Potassium 4.2 3.5 - 5.3 mmol/L   Chloride 104 98 - 110 mmol/L   CO2 22 20 - 31 mmol/L   Glucose, Bld 184 (H) 65 - 99 mg/dL   BUN 14 7 - 25 mg/dL   Creat 0.67 0.50 - 0.99 mg/dL   Total Bilirubin 0.2 0.2 - 1.2 mg/dL   Alkaline Phosphatase 61 33 - 130 U/L   AST 13 10 - 35 U/L   ALT 16 6 - 29 U/L   Total Protein 6.4 6.1 - 8.1 g/dL   Albumin 4.2 3.6 - 5.1 g/dL   Calcium 9.4 8.6 - 10.4 mg/dL  Lipid panel  Result Value Ref Range   Cholesterol 144 <200 mg/dL   Triglycerides 283 (H) <150 mg/dL   HDL 28 (L) >50 mg/dL   Total CHOL/HDL Ratio 5.1 (H) <5.0 Ratio   VLDL 57 (H) <30 mg/dL   LDL Cholesterol 59 <100 mg/dL  Hemoglobin A1c  Result Value Ref Range   Hgb A1c MFr Bld 7.6 (H) <5.7 %   Mean Plasma Glucose 171 mg/dL   Lipid Panel     Component Value Date/Time   CHOL 144 09/04/2016 1036   TRIG 283 (H) 09/04/2016 1036  HDL 28 (L) 09/04/2016 1036   CHOLHDL 5.1 (H) 09/04/2016 1036   VLDL 57 (H) 09/04/2016 1036   LDLCALC 59 09/04/2016 1036     Assessment & Plan:   1. Uncontrolled type 2 diabetes mellitus with complication, with long-term current use of insulin ([place])  Patient came with Slightly above target fasting glucose profile, fasting EAG at  157,  A1c  Higher at 7.6% .   Glucose logs and insulin administration records pertaining to this visit,  to be scanned into patient's records.  Recent labs reviewed. - Patient remains at a high risk for more acute and chronic complications of diabetes which include CAD, CVA, CKD, retinopathy, and neuropathy. These are all discussed in detail with the patient.  - I have re-counseled the patient on diet management and weight loss  by adopting a carbohydrate restricted / protein rich  Diet. - Patient is advised to stick to a routine mealtimes to eat 3 meals  a day and avoid unnecessary snacks ( to snack only to correct hypoglycemia).  - Suggestion is made for patient to avoid simple carbohydrates   from her diet including Cakes , Desserts,  Ice Cream,  Soda (  diet and regular) , Sweet Tea , Candies,  Chips, Cookies, Artificial Sweeteners,   and "Sugar-free" Products .  This will help patient to have stable blood glucose profile and potentially avoid unintended  Weight gain.  - I have approached patient with the following individualized plan to manage diabetes and patient agrees.   -  Her most recent A1c is  7.6%.  I advised her to increase Lantus to 50 units daily at bedtime, associated with monitoring of blood glucose daily before breakfast and as needed.  -Patient is encouraged to call clinic for blood glucose levels less than 70 or above 300 mg /dl. -I will continue metformin 1000 milligrams by mouth twice a day, and Invokana 100mg  po qam.  - Patient will be considered for incretin therapy as appropriate next visit. - Patient specific target  for A1c; LDL, HDL, Triglycerides, and  Waist Circumference were discussed in detail.  2) BP/HTN: Controlled. Continue current medications including ACEI. 3) Lipids/HPL:  continue statins. 4)  Weight/Diet: CDE consult in progress, exercise, and carbohydrates information provided.  5) Chronic Care/Health Maintenance:  -Patient  on ACEI and Statin medications and encouraged to continue to follow up with Ophthalmology, Podiatrist at least yearly or according to recommendations, and advised to  stay away from smoking. I have recommended yearly flu vaccine and pneumonia vaccination at least every 5 years; moderate intensity exercise for up to 150 minutes weekly; and  sleep for at least 7 hours a day.  I advised patient to maintain close follow up with their PCP for primary care needs.  Patient is asked to bring meter and  blood glucose logs during her next visit.   Follow up plan: Return in about 3 months (around 12/13/2016) for follow up with pre-visit labs, meter, and logs.  Glade Lloyd, MD Phone: [phone]  Fax: [phone]   09/12/2016, 12:20 PM

## 2016-09-12 NOTE — Patient Instructions (Signed)

## 2016-11-20 ENCOUNTER — Other Ambulatory Visit: Payer: Self-pay | Admitting: "Endocrinology

## 2016-12-09 ENCOUNTER — Other Ambulatory Visit: Payer: Self-pay | Admitting: "Endocrinology

## 2016-12-17 ENCOUNTER — Ambulatory Visit: Payer: Medicare PPO | Admitting: "Endocrinology

## 2016-12-24 ENCOUNTER — Other Ambulatory Visit: Payer: Self-pay | Admitting: "Endocrinology

## 2016-12-25 LAB — RENAL FUNCTION PANEL
Albumin: 4.3 g/dL (ref 3.6–5.1)
BUN: 15 mg/dL (ref 7–25)
CALCIUM: 9.3 mg/dL (ref 8.6–10.4)
CO2: 27 mmol/L (ref 20–32)
Chloride: 102 mmol/L (ref 98–110)
Creat: 0.68 mg/dL (ref 0.50–0.99)
Glucose, Bld: 270 mg/dL — ABNORMAL HIGH (ref 65–99)
POTASSIUM: 4.4 mmol/L (ref 3.5–5.3)
Phosphorus: 2.9 mg/dL (ref 2.5–4.5)
SODIUM: 137 mmol/L (ref 135–146)

## 2016-12-25 LAB — HEMOGLOBIN A1C
EAG (MMOL/L): 9.8 (calc)
HEMOGLOBIN A1C: 7.8 %{Hb} — AB (ref <5.7)
MEAN PLASMA GLUCOSE: 177 (calc)

## 2016-12-30 ENCOUNTER — Other Ambulatory Visit: Payer: Self-pay | Admitting: "Endocrinology

## 2016-12-31 ENCOUNTER — Ambulatory Visit: Payer: Medicare PPO | Admitting: "Endocrinology

## 2017-01-01 ENCOUNTER — Ambulatory Visit: Payer: Medicare PPO | Admitting: "Endocrinology

## 2017-01-01 ENCOUNTER — Encounter: Payer: Self-pay | Admitting: "Endocrinology

## 2017-01-01 VITALS — BP 125/74 | HR 69 | Ht 67.0 in | Wt 205.0 lb

## 2017-01-01 DIAGNOSIS — E782 Mixed hyperlipidemia: Secondary | ICD-10-CM

## 2017-01-01 DIAGNOSIS — IMO0002 Reserved for concepts with insufficient information to code with codable children: Secondary | ICD-10-CM

## 2017-01-01 DIAGNOSIS — E1165 Type 2 diabetes mellitus with hyperglycemia: Secondary | ICD-10-CM

## 2017-01-01 DIAGNOSIS — Z794 Long term (current) use of insulin: Secondary | ICD-10-CM | POA: Diagnosis not present

## 2017-01-01 DIAGNOSIS — E118 Type 2 diabetes mellitus with unspecified complications: Secondary | ICD-10-CM

## 2017-01-01 DIAGNOSIS — I10 Essential (primary) hypertension: Secondary | ICD-10-CM

## 2017-01-01 MED ORDER — INSULIN GLARGINE-LIXISENATIDE 100-33 UNT-MCG/ML ~~LOC~~ SOPN: 50.0000 [IU] | PEN_INJECTOR | Freq: Every day | SUBCUTANEOUS | 2 refills | Status: DC

## 2017-01-01 NOTE — Progress Notes (Signed)
Subjective:    Patient ID: Melanie Schultz, female    DOB: July 07, 1955,    Past Medical History:  Diagnosis Date  . Diabetes mellitus without complication ([place])   . Multiple sclerosis ([place])    Past Surgical History:  Procedure Laterality Date  . CESAREAN SECTION    . NASAL SEPTUM SURGERY     Social History   Socioeconomic History  . Marital status: Divorced    Spouse name: None  . Number of children: None  . Years of education: None  . Highest education level: None  Social Needs  . Financial resource strain: None  . Food insecurity - worry: None  . Food insecurity - inability: None  . Transportation needs - medical: None  . Transportation needs - non-medical: None  Occupational History  . None  Tobacco Use  . Smoking status: Former Research scientist (life sciences)  . Smokeless tobacco: Never Used  Substance and Sexual Activity  . Alcohol use: No    Alcohol/week: 0.0 oz  . Drug use: No  . Sexual activity: None  Other Topics Concern  . None  Social History Narrative  . None   Outpatient Encounter Medications as of 01/01/2017  Medication Sig  . ACCU-CHEK AVIVA PLUS test strip CHECK BLOOD GLUCOSE TWICE DAILY  . amLODipine (NORVASC) 5 MG tablet Take 5 mg by mouth daily.  Marland Kitchen atenolol (TENORMIN) 50 MG tablet Take 50 mg by mouth daily.  Marland Kitchen atorvastatin (LIPITOR) 80 MG tablet Take 80 mg by mouth daily.  . Insulin Glargine-Lixisenatide (SOLIQUA) 100-33 UNT-MCG/ML SOPN Inject 50 Units into the skin daily with breakfast.  . Insulin Pen Needle (B-D ULTRAFINE III SHORT PEN) 31G X 8 MM MISC 1 each by Does not apply route as directed.  . Lancets (ACCU-CHEK SOFT TOUCH) lancets Use as instructed for once daily testing  . metFORMIN (GLUCOPHAGE) 1000 MG tablet TAKE 1 TABLET BY MOUTH TWICE DAILY WITH MEALS  . methylphenidate (DAYTRANA) 20 MG/9HR Place 1 patch onto the skin daily. wear patch for 9 hours only each day  . omeprazole (PRILOSEC) 20 MG capsule Take 20 mg by mouth daily.  . Teriflunomide 14 MG  TABS Take by mouth.  . Vitamin D, Ergocalciferol, (DRISDOL) 50000 units CAPS capsule Take 50,000 Units by mouth every 7 (seven) days.  . [DISCONTINUED] fluconazole (DIFLUCAN) 150 MG tablet Take 1 tablet (150 mg total) by mouth continuous as needed.  . [DISCONTINUED] Insulin Glargine (LANTUS SOLOSTAR) 100 UNIT/ML Solostar Pen INJECT 50 UNITS SUBCUTANEOUSLY AT BEDTIME  . [DISCONTINUED] Insulin Glargine (LANTUS SOLOSTAR) 100 UNIT/ML Solostar Pen Inject 50 Units at bedtime into the skin.  . [DISCONTINUED] INVOKANA 100 MG TABS tablet TAKE ONE TABLET BY MOUTH ONCE DAILY   No facility-administered encounter medications on file as of 01/01/2017.    ALLERGIES: No Known Allergies VACCINATION STATUS:  There is no immunization history on file for this patient.  Diabetes  She presents for her follow-up diabetic visit. She has type 2 diabetes mellitus. Onset time: She was diagnosed at approximate age of 73 years. Her disease course has been stable. There are no hypoglycemic associated symptoms. Pertinent negatives for hypoglycemia include no confusion, headaches, pallor or seizures. There are no diabetic associated symptoms. Pertinent negatives for diabetes include no chest pain, no fatigue, no polydipsia, no polyphagia and no polyuria. There are no hypoglycemic complications. Symptoms are stable. There are no diabetic complications. Risk factors for coronary artery disease include dyslipidemia, hypertension and obesity. Current diabetic treatment includes insulin injections and oral agent (  dual therapy). She is compliant with treatment most of the time. Her weight is increasing steadily. She is following a generally unhealthy diet. She has had a previous visit with a dietitian. She participates in exercise intermittently. There is no change in her home blood glucose trend. Her breakfast blood glucose range is generally 140-180 mg/dl. Her overall blood glucose range is 140-180 mg/dl. An ACE inhibitor/angiotensin  II receptor blocker is being taken. Eye exam is current.  Hyperlipidemia  This is a chronic problem. The current episode started more than 1 year ago. Pertinent negatives include no chest pain, myalgias or shortness of breath. Current antihyperlipidemic treatment includes statins.  Hypertension  This is a chronic problem. The current episode started more than 1 year ago. Pertinent negatives include no chest pain, headaches, palpitations or shortness of breath. Past treatments include ACE inhibitors.     Review of Systems  Constitutional: Negative for fatigue and unexpected weight change.  HENT: Negative for trouble swallowing and voice change.   Eyes: Negative for visual disturbance.  Respiratory: Negative for cough, shortness of breath and wheezing.   Cardiovascular: Negative for chest pain, palpitations and leg swelling.  Gastrointestinal: Negative for diarrhea, nausea and vomiting.  Endocrine: Negative for cold intolerance, heat intolerance, polydipsia, polyphagia and polyuria.  Musculoskeletal: Negative for arthralgias and myalgias.  Skin: Negative for color change, pallor, rash and wound.  Neurological: Negative for seizures and headaches.  Psychiatric/Behavioral: Negative for confusion and suicidal ideas.    Objective:    BP 125/74   Pulse 69   Ht 5\' 7"  (1.702 m)   Wt 205 lb (93 kg)   BMI 32.11 kg/m   Wt Readings from Last 3 Encounters:  01/01/17 205 lb (93 kg)  09/12/16 202 lb (91.6 kg)  05/13/16 204 lb (92.5 kg)    Physical Exam  Constitutional: She is oriented to person, place, and time. She appears well-developed.  HENT:  Head: Normocephalic and atraumatic.  Eyes: EOM are normal.  Neck: Normal range of motion. Neck supple. No tracheal deviation present. No thyromegaly present.  Cardiovascular: Normal rate and regular rhythm.  Pulmonary/Chest: Effort normal and breath sounds normal.  Abdominal: Soft. Bowel sounds are normal. There is no tenderness. There is no  guarding.  Musculoskeletal: Normal range of motion. She exhibits no edema.  Neurological: She is alert and oriented to person, place, and time. She has normal reflexes. No cranial nerve deficit. Coordination normal.  Skin: Skin is warm and dry. No rash noted. No erythema. No pallor.  Psychiatric: She has a normal mood and affect. Judgment normal.    Results for orders placed or performed in visit on 12/24/16  Renal function panel  Result Value Ref Range   Glucose, Bld 270 (H) 65 - 99 mg/dL   BUN 15 7 - 25 mg/dL   Creat 0.68 0.50 - 0.99 mg/dL   BUN/Creatinine Ratio NOT APPLICABLE 6 - 22 (calc)   Sodium 137 135 - 146 mmol/L   Potassium 4.4 3.5 - 5.3 mmol/L   Chloride 102 98 - 110 mmol/L   CO2 27 20 - 32 mmol/L   Calcium 9.3 8.6 - 10.4 mg/dL   Phosphorus 2.9 2.5 - 4.5 mg/dL   Albumin 4.3 3.6 - 5.1 g/dL  Hemoglobin A1c  Result Value Ref Range   Hgb A1c MFr Bld 7.8 (H) <5.7 % of total Hgb   Mean Plasma Glucose 177 (calc)   eAG (mmol/L) 9.8 (calc)   Lipid Panel     Component Value Date/Time  CHOL 144 09/04/2016 1036   TRIG 283 (H) 09/04/2016 1036   HDL 28 (L) 09/04/2016 1036   CHOLHDL 5.1 (H) 09/04/2016 1036   VLDL 57 (H) 09/04/2016 1036   LDLCALC 59 09/04/2016 1036     Assessment & Plan:   1. Uncontrolled type 2 diabetes mellitus with complication, with long-term current use of insulin ([place])  Patient came with Slightly above target fasting glucose profile, fasting EAG at  157,  A1c  Higher at 7.8% .   Glucose logs and insulin administration records pertaining to this visit,  to be scanned into patient's records.  Recent labs reviewed. - Patient remains at a high risk for more acute and chronic complications of diabetes which include CAD, CVA, CKD, retinopathy, and neuropathy. These are all discussed in detail with the patient.  - I have re-counseled the patient on diet management and weight loss  by adopting a carbohydrate restricted / protein rich  Diet. - Patient is  advised to stick to a routine mealtimes to eat 3 meals  a day and avoid unnecessary snacks ( to snack only to correct hypoglycemia).  -  Suggestion is made for her to avoid simple carbohydrates  from her diet including Cakes, Sweet Desserts / Pastries, Ice Cream, Soda (diet and regular), Sweet Tea, Candies, Chips, Cookies, Store Bought Juices, Alcohol in Excess of  1-2 drinks a day, Artificial Sweeteners, and "Sugar-free" Products. This will help patient to have stable blood glucose profile and potentially avoid unintended weight gain.   - I have approached patient with the following individualized plan to manage diabetes and patient agrees.   -  Her most recent A1c is  7.8%.  I advised her to continue Lantus to 50 units daily at bedtime until she finishes her current supplies and switch to [facility] 50 units daily with breakfast  associated with monitoring of blood glucose daily before breakfast and as needed.  -Patient is encouraged to call clinic for blood glucose levels less than 70 or above 300 mg /dl. -I will continue metformin 1000 milligrams by mouth twice a day. - She is advised to discontinue Invokana.  - Patient specific target  for A1c; LDL, HDL, Triglycerides, and  Waist Circumference were discussed in detail.  2) BP/HTN: Controlled.  Advised her to continue current medications including  ACEI. 3) Lipids/HPL:  continue statins. 4)  Weight/Diet: CDE consult in progress, exercise, and carbohydrates information provided.  5) Chronic Care/Health Maintenance:  -Patient  on ACEI and Statin medications and encouraged to continue to follow up with Ophthalmology, Podiatrist at least yearly or according to recommendations, and advised to  stay away from smoking. I have recommended yearly flu vaccine and pneumonia vaccination at least every 5 years; moderate intensity exercise for up to 150 minutes weekly; and  sleep for at least 7 hours a day.  I advised patient to maintain close follow up  with her PCP for primary care needs. - Time spent with the patient: 25 min, of which >50% was spent in reviewing her sugar logs , discussing her hypo- and hyper-glycemic episodes, reviewing her current and  previous labs and insulin doses and developing a plan to avoid hypo- and hyper-glycemia.    Follow up plan: Return in about 3 months (around 04/03/2017) for meter, and logs.  Glade Lloyd, MD Phone: [phone]  Fax: [phone]   -  This note was partially dictated with voice recognition software. Similar sounding words can be transcribed inadequately or may not  be corrected upon  review.  01/01/2017, 3:57 PM

## 2017-01-01 NOTE — Patient Instructions (Signed)

## 2017-01-29 ENCOUNTER — Telehealth: Payer: Self-pay

## 2017-01-29 NOTE — Telephone Encounter (Signed)
When she was taking Lantus, she was testing at fasting. It is unlikely that her current readings at bedtime are worse now than before. I recommend for her to stay on Soliqua , may increase it to 60 units every morning with breakfast along with her metformin 1000 g daily twice a day.

## 2017-01-29 NOTE — Telephone Encounter (Signed)
Pt states she has had high BG readings.   Date Before breakfast Before lunch Before supper Bedtime  12/22    245  12/23    187  12/24    186  12/25    140    Pt taking: Soliqua 50 units qam, MTF 1000mg  bid. Pt states that readings were better on Lantus

## 2017-01-29 NOTE — Telephone Encounter (Signed)
Pt.notified

## 2017-01-30 ENCOUNTER — Other Ambulatory Visit: Payer: Self-pay | Admitting: "Endocrinology

## 2017-01-31 ENCOUNTER — Other Ambulatory Visit: Payer: Self-pay | Admitting: "Endocrinology

## 2017-03-25 ENCOUNTER — Other Ambulatory Visit: Payer: Self-pay | Admitting: "Endocrinology

## 2017-03-27 ENCOUNTER — Other Ambulatory Visit: Payer: Self-pay | Admitting: "Endocrinology

## 2017-03-28 LAB — COMPLETE METABOLIC PANEL WITH GFR
AG Ratio: 2 (calc) (ref 1.0–2.5)
ALKALINE PHOSPHATASE (APISO): 59 U/L (ref 33–130)
ALT: 31 U/L — AB (ref 6–29)
AST: 25 U/L (ref 10–35)
Albumin: 4.6 g/dL (ref 3.6–5.1)
BILIRUBIN TOTAL: 0.3 mg/dL (ref 0.2–1.2)
BUN: 18 mg/dL (ref 7–25)
CHLORIDE: 103 mmol/L (ref 98–110)
CO2: 25 mmol/L (ref 20–32)
Calcium: 9.6 mg/dL (ref 8.6–10.4)
Creat: 0.66 mg/dL (ref 0.50–0.99)
GFR, Est African American: 109 mL/min/{1.73_m2} (ref >=60)
GFR, Est Non African American: 94 mL/min/{1.73_m2} (ref >=60)
GLUCOSE: 180 mg/dL — AB (ref 65–99)
Globulin: 2.3 g/dL (calc) (ref 1.9–3.7)
Potassium: 4.5 mmol/L (ref 3.5–5.3)
Sodium: 137 mmol/L (ref 135–146)
TOTAL PROTEIN: 6.9 g/dL (ref 6.1–8.1)

## 2017-03-28 LAB — HEMOGLOBIN A1C
EAG (MMOL/L): 9.2 (calc)
HEMOGLOBIN A1C: 7.4 %{Hb} — AB (ref <5.7)
Mean Plasma Glucose: 166 (calc)

## 2017-04-07 ENCOUNTER — Encounter: Payer: Self-pay | Admitting: "Endocrinology

## 2017-04-07 ENCOUNTER — Ambulatory Visit: Payer: Medicare PPO | Admitting: "Endocrinology

## 2017-04-07 VITALS — BP 134/84 | HR 93 | Ht 67.0 in | Wt 208.0 lb

## 2017-04-07 DIAGNOSIS — E118 Type 2 diabetes mellitus with unspecified complications: Secondary | ICD-10-CM

## 2017-04-07 DIAGNOSIS — I10 Essential (primary) hypertension: Secondary | ICD-10-CM | POA: Diagnosis not present

## 2017-04-07 DIAGNOSIS — Z794 Long term (current) use of insulin: Secondary | ICD-10-CM

## 2017-04-07 DIAGNOSIS — E782 Mixed hyperlipidemia: Secondary | ICD-10-CM | POA: Diagnosis not present

## 2017-04-07 DIAGNOSIS — E1165 Type 2 diabetes mellitus with hyperglycemia: Secondary | ICD-10-CM | POA: Diagnosis not present

## 2017-04-07 DIAGNOSIS — IMO0002 Reserved for concepts with insufficient information to code with codable children: Secondary | ICD-10-CM

## 2017-04-07 MED ORDER — INSULIN GLARGINE-LIXISENATIDE 100-33 UNT-MCG/ML ~~LOC~~ SOPN: 60.0000 [IU] | PEN_INJECTOR | Freq: Every day | SUBCUTANEOUS | 2 refills | Status: DC

## 2017-04-07 NOTE — Patient Instructions (Signed)

## 2017-04-07 NOTE — Progress Notes (Signed)
Subjective:    Patient ID: Melanie Schultz, female    DOB: 1955-03-17,    Past Medical History:  Diagnosis Date  . Diabetes mellitus without complication ([place])   . Multiple sclerosis ([place])    Past Surgical History:  Procedure Laterality Date  . CESAREAN SECTION    . NASAL SEPTUM SURGERY     Social History   Socioeconomic History  . Marital status: Married    Spouse name: None  . Number of children: None  . Years of education: None  . Highest education level: None  Social Needs  . Financial resource strain: None  . Food insecurity - worry: None  . Food insecurity - inability: None  . Transportation needs - medical: None  . Transportation needs - non-medical: None  Occupational History  . None  Tobacco Use  . Smoking status: Former Research scientist (life sciences)  . Smokeless tobacco: Never Used  Substance and Sexual Activity  . Alcohol use: No    Alcohol/week: 0.0 oz  . Drug use: No  . Sexual activity: None  Other Topics Concern  . None  Social History Narrative  . None   Outpatient Encounter Medications as of 04/07/2017  Medication Sig  . ACCU-CHEK AVIVA PLUS test strip CHECK BLOOD GLUCOSE TWICE DAILY  . ACCU-CHEK SOFTCLIX LANCETS lancets USE AS INSTRUCTED FOR ONCE DAILY TESTING  . amLODipine (NORVASC) 5 MG tablet Take 5 mg by mouth daily.  Marland Kitchen atenolol (TENORMIN) 50 MG tablet Take 50 mg by mouth daily.  Marland Kitchen atorvastatin (LIPITOR) 80 MG tablet Take 80 mg by mouth daily.  . Insulin Glargine-Lixisenatide (SOLIQUA) 100-33 UNT-MCG/ML SOPN Inject 60 Units into the skin at bedtime.  . Insulin Pen Needle (B-D ULTRAFINE III SHORT PEN) 31G X 8 MM MISC 1 each by Does not apply route as directed.  . Insulin Pen Needle (RELION PEN NEEDLE 31G/8MM) 31G X 8 MM MISC USE DAILY AS DIRECTED  . metFORMIN (GLUCOPHAGE) 1000 MG tablet TAKE 1 TABLET BY MOUTH TWICE DAILY WITH MEALS  . methylphenidate (DAYTRANA) 20 MG/9HR Place 1 patch onto the skin daily. wear patch for 9 hours only each day  . omeprazole  (PRILOSEC) 20 MG capsule Take 20 mg by mouth daily.  . Teriflunomide 14 MG TABS Take by mouth.  . Vitamin D, Ergocalciferol, (DRISDOL) 50000 units CAPS capsule Take 50,000 Units by mouth every 7 (seven) days.  . [DISCONTINUED] SOLIQUA 100-33 UNT-MCG/ML SOPN INJECT 50 UNITS SUBCUTANEOUSLY ONCE DAILY WITH BREAKFAST (Patient taking differently: INJECT 60 UNITS SUBCUTANEOUSLY ONCE DAILY WITH BREAKFAST)   No facility-administered encounter medications on file as of 04/07/2017.    ALLERGIES: No Known Allergies VACCINATION STATUS:  There is no immunization history on file for this patient.  Diabetes  She presents for her follow-up diabetic visit. She has type 2 diabetes mellitus. Onset time: She was diagnosed at approximate age of 15 years. Her disease course has been improving. There are no hypoglycemic associated symptoms. Pertinent negatives for hypoglycemia include no confusion, headaches, pallor or seizures. There are no diabetic associated symptoms. Pertinent negatives for diabetes include no chest pain, no fatigue, no polydipsia, no polyphagia and no polyuria. There are no hypoglycemic complications. Symptoms are improving. There are no diabetic complications. Risk factors for coronary artery disease include dyslipidemia, hypertension and obesity. Current diabetic treatment includes insulin injections and oral agent (dual therapy). She is compliant with treatment most of the time. Her weight is increasing steadily. She is following a generally unhealthy diet. She has had a previous  visit with a dietitian. She participates in exercise intermittently. There is no change in her home blood glucose trend. Her breakfast blood glucose range is generally 140-180 mg/dl. Her overall blood glucose range is 140-180 mg/dl. An ACE inhibitor/angiotensin II receptor blocker is being taken. Eye exam is current.  Hyperlipidemia  This is a chronic problem. The current episode started more than 1 year ago. Exacerbating  diseases include diabetes and obesity. Pertinent negatives include no chest pain, myalgias or shortness of breath. Current antihyperlipidemic treatment includes statins. Risk factors for coronary artery disease include diabetes mellitus, dyslipidemia, a sedentary lifestyle and post-menopausal.  Hypertension  This is a chronic problem. The current episode started more than 1 year ago. Pertinent negatives include no chest pain, headaches, palpitations or shortness of breath. Risk factors for coronary artery disease include diabetes mellitus, dyslipidemia, post-menopausal state, sedentary lifestyle and smoking/tobacco exposure. Past treatments include ACE inhibitors.     Review of Systems  Constitutional: Negative for fatigue and unexpected weight change.  HENT: Negative for trouble swallowing and voice change.   Eyes: Negative for visual disturbance.  Respiratory: Negative for cough, shortness of breath and wheezing.   Cardiovascular: Negative for chest pain, palpitations and leg swelling.  Gastrointestinal: Negative for diarrhea, nausea and vomiting.  Endocrine: Negative for cold intolerance, heat intolerance, polydipsia, polyphagia and polyuria.  Musculoskeletal: Negative for arthralgias and myalgias.  Skin: Negative for color change, pallor, rash and wound.  Neurological: Negative for seizures and headaches.  Psychiatric/Behavioral: Negative for confusion and suicidal ideas.    Objective:    BP 134/84   Pulse 93   Ht 5\' 7"  (1.702 m)   Wt 208 lb (94.3 kg)   BMI 32.58 kg/m   Wt Readings from Last 3 Encounters:  04/07/17 208 lb (94.3 kg)  01/01/17 205 lb (93 kg)  09/12/16 202 lb (91.6 kg)    Physical Exam  Constitutional: She is oriented to person, place, and time. She appears well-developed.  HENT:  Head: Normocephalic and atraumatic.  Eyes: EOM are normal.  Neck: Normal range of motion. Neck supple. No tracheal deviation present. No thyromegaly present.  Cardiovascular: Normal  rate and regular rhythm.  Pulmonary/Chest: Effort normal and breath sounds normal.  Abdominal: Soft. Bowel sounds are normal. There is no tenderness. There is no guarding.  Musculoskeletal: Normal range of motion. She exhibits no edema.  Neurological: She is alert and oriented to person, place, and time. She has normal reflexes. No cranial nerve deficit. Coordination normal.  Skin: Skin is warm and dry. No rash noted. No erythema. No pallor.  Psychiatric: She has a normal mood and affect. Judgment normal.    Results for orders placed or performed in visit on 03/27/17  COMPLETE METABOLIC PANEL WITH GFR  Result Value Ref Range   Glucose, Bld 180 (H) 65 - 99 mg/dL   BUN 18 7 - 25 mg/dL   Creat 0.66 0.50 - 0.99 mg/dL   GFR, Est Non African American 94 > OR = 60 mL/min/1.51m2   GFR, Est African American 109 > OR = 60 mL/min/1.57m2   BUN/Creatinine Ratio NOT APPLICABLE 6 - 22 (calc)   Sodium 137 135 - 146 mmol/L   Potassium 4.5 3.5 - 5.3 mmol/L   Chloride 103 98 - 110 mmol/L   CO2 25 20 - 32 mmol/L   Calcium 9.6 8.6 - 10.4 mg/dL   Total Protein 6.9 6.1 - 8.1 g/dL   Albumin 4.6 3.6 - 5.1 g/dL   Globulin 2.3 1.9 - 3.7  g/dL (calc)   AG Ratio 2.0 1.0 - 2.5 (calc)   Total Bilirubin 0.3 0.2 - 1.2 mg/dL   Alkaline phosphatase (APISO) 59 33 - 130 U/L   AST 25 10 - 35 U/L   ALT 31 (H) 6 - 29 U/L  Hemoglobin A1c  Result Value Ref Range   Hgb A1c MFr Bld 7.4 (H) <5.7 % of total Hgb   Mean Plasma Glucose 166 (calc)   eAG (mmol/L) 9.2 (calc)   Lipid Panel     Component Value Date/Time   CHOL 144 09/04/2016 1036   TRIG 283 (H) 09/04/2016 1036   HDL 28 (L) 09/04/2016 1036   CHOLHDL 5.1 (H) 09/04/2016 1036   VLDL 57 (H) 09/04/2016 1036   LDLCALC 59 09/04/2016 1036     Assessment & Plan:   1. Uncontrolled type 2 diabetes mellitus with complication, with long-term current use of insulin ([place])  Patient came with better fasting blood glucose average at 150 mg/dL.  Her recent A1c was 7.4%  progressively improving.   Glucose logs and insulin administration records pertaining to this visit,  to be scanned into patient's records.  Recent labs reviewed. - Patient remains at a high risk for more acute and chronic complications of diabetes which include CAD, CVA, CKD, retinopathy, and neuropathy. These are all discussed in detail with the patient.  - I have re-counseled the patient on diet management and weight loss  by adopting a carbohydrate restricted / protein rich  Diet. - Patient is advised to stick to a routine mealtimes to eat 3 meals  a day and avoid unnecessary snacks ( to snack only to correct hypoglycemia).  -  Suggestion is made for her to avoid simple carbohydrates  from her diet including Cakes, Sweet Desserts / Pastries, Ice Cream, Soda (diet and regular), Sweet Tea, Candies, Chips, Cookies, Store Bought Juices, Alcohol in Excess of  1-2 drinks a day, Artificial Sweeteners, and "Sugar-free" Products. This will help patient to have stable blood glucose profile and potentially avoid unintended weight gain.  - I have approached patient with the following individualized plan to manage diabetes and patient agrees.   -  Her most recent A1c is  7.4%.  I advised her to continue Soliqua 60/20 units daily at bedtime associated with monitoring of blood glucose daily before breakfast.   -Patient is encouraged to call clinic for blood glucose levels less than 70 or above 300 mg /dl. -I will continue metformin 1000 milligrams by mouth twice a day. - Patient specific target  for A1c; LDL, HDL, Triglycerides, and  Waist Circumference were discussed in detail.  2) BP/HTN: Her blood pressure is controlled to target.    Advised her to continue current medications including  ACEI. 3) Lipids/HPL:  continue statins. 4)  Weight/Diet: CDE consult in progress, exercise, and carbohydrates information provided.  5) Chronic Care/Health Maintenance:  -Patient  on ACEI and Statin medications and  encouraged to continue to follow up with Ophthalmology, Podiatrist at least yearly or according to recommendations, and advised to  stay away from smoking. I have recommended yearly flu vaccine and pneumonia vaccination at least every 5 years; moderate intensity exercise for up to 150 minutes weekly; and  sleep for at least 7 hours a day.  I advised patient to maintain close follow up with her PCP for primary care needs. - Time spent with the patient: 25 min, of which >50% was spent in reviewing her blood glucose logs , discussing her hypo- and hyper-glycemic  episodes, reviewing her current and  previous labs and insulin doses and developing a plan to avoid hypo- and hyper-glycemia. Please refer to Patient Instructions for Blood Glucose Monitoring and Insulin/Medications Dosing Guide"  in media tab for additional information.  Follow up plan: Return in about 4 months (around 08/07/2017) for meter, and logs.  Glade Lloyd, MD Phone: [phone]  Fax: [phone]   -  This note was partially dictated with voice recognition software. Similar sounding words can be transcribed inadequately or may not  be corrected upon review.  04/07/2017, 3:54 PM

## 2017-05-15 ENCOUNTER — Other Ambulatory Visit: Payer: Self-pay | Admitting: "Endocrinology

## 2017-07-09 ENCOUNTER — Other Ambulatory Visit: Payer: Self-pay

## 2017-07-09 MED ORDER — INSULIN GLARGINE-LIXISENATIDE 100-33 UNT-MCG/ML ~~LOC~~ SOPN: 60.0000 [IU] | PEN_INJECTOR | Freq: Every day | SUBCUTANEOUS | 2 refills | Status: DC

## 2017-07-30 ENCOUNTER — Other Ambulatory Visit (HOSPITAL_COMMUNITY): Payer: Self-pay | Admitting: Family Medicine

## 2017-07-30 DIAGNOSIS — Z1231 Encounter for screening mammogram for malignant neoplasm of breast: Secondary | ICD-10-CM

## 2017-08-05 LAB — COMPLETE METABOLIC PANEL WITH GFR
AG RATIO: 1.8 (calc) (ref 1.0–2.5)
ALT: 46 U/L — ABNORMAL HIGH (ref 6–29)
AST: 50 U/L — AB (ref 10–35)
Albumin: 4.4 g/dL (ref 3.6–5.1)
Alkaline phosphatase (APISO): 69 U/L (ref 33–130)
BUN: 14 mg/dL (ref 7–25)
CALCIUM: 9.8 mg/dL (ref 8.6–10.4)
CO2: 24 mmol/L (ref 20–32)
Chloride: 101 mmol/L (ref 98–110)
Creat: 0.74 mg/dL (ref 0.50–0.99)
GFR, EST NON AFRICAN AMERICAN: 86 mL/min/{1.73_m2} (ref >=60)
GFR, Est African American: 100 mL/min/{1.73_m2} (ref >=60)
GLOBULIN: 2.4 g/dL (ref 1.9–3.7)
Glucose, Bld: 251 mg/dL — ABNORMAL HIGH (ref 65–139)
POTASSIUM: 4.3 mmol/L (ref 3.5–5.3)
SODIUM: 135 mmol/L (ref 135–146)
Total Bilirubin: 0.3 mg/dL (ref 0.2–1.2)
Total Protein: 6.8 g/dL (ref 6.1–8.1)

## 2017-08-05 LAB — HEMOGLOBIN A1C
Hgb A1c MFr Bld: 8.9 % of total Hgb — ABNORMAL HIGH (ref <5.7)
MEAN PLASMA GLUCOSE: 209 (calc)
eAG (mmol/L): 11.6 (calc)

## 2017-08-11 ENCOUNTER — Ambulatory Visit: Payer: Medicare PPO | Admitting: "Endocrinology

## 2017-08-11 ENCOUNTER — Encounter: Payer: Self-pay | Admitting: "Endocrinology

## 2017-08-11 VITALS — BP 132/81 | HR 87 | Ht 67.0 in | Wt 210.0 lb

## 2017-08-11 DIAGNOSIS — I10 Essential (primary) hypertension: Secondary | ICD-10-CM

## 2017-08-11 DIAGNOSIS — E782 Mixed hyperlipidemia: Secondary | ICD-10-CM | POA: Diagnosis not present

## 2017-08-11 DIAGNOSIS — Z794 Long term (current) use of insulin: Secondary | ICD-10-CM

## 2017-08-11 DIAGNOSIS — E118 Type 2 diabetes mellitus with unspecified complications: Secondary | ICD-10-CM | POA: Diagnosis not present

## 2017-08-11 DIAGNOSIS — IMO0002 Reserved for concepts with insufficient information to code with codable children: Secondary | ICD-10-CM

## 2017-08-11 DIAGNOSIS — E1165 Type 2 diabetes mellitus with hyperglycemia: Secondary | ICD-10-CM | POA: Diagnosis not present

## 2017-08-11 MED ORDER — ATORVASTATIN CALCIUM 40 MG PO TABS: 40.0000 mg | ORAL_TABLET | Freq: Every day | ORAL | 6 refills | Status: DC

## 2017-08-11 MED ORDER — METFORMIN HCL ER (MOD) 1000 MG PO TB24: 1000.0000 mg | ORAL_TABLET | Freq: Every day | ORAL | 3 refills | Status: DC

## 2017-08-11 NOTE — Patient Instructions (Signed)

## 2017-08-11 NOTE — Progress Notes (Signed)
Subjective:    Patient ID: Melanie Schultz, female    DOB: 24-Sep-1955,    Past Medical History:  Diagnosis Date  . Diabetes mellitus without complication ([place])   . Multiple sclerosis ([place])    Past Surgical History:  Procedure Laterality Date  . CESAREAN SECTION    . NASAL SEPTUM SURGERY     Social History   Socioeconomic History  . Marital status: Married    Spouse name: Not on file  . Number of children: Not on file  . Years of education: Not on file  . Highest education level: Not on file  Occupational History  . Not on file  Social Needs  . Financial resource strain: Not on file  . Food insecurity:    Worry: Not on file    Inability: Not on file  . Transportation needs:    Medical: Not on file    Non-medical: Not on file  Tobacco Use  . Smoking status: Former Research scientist (life sciences)  . Smokeless tobacco: Never Used  Substance and Sexual Activity  . Alcohol use: No    Alcohol/week: 0.0 oz  . Drug use: No  . Sexual activity: Not on file  Lifestyle  . Physical activity:    Days per week: Not on file    Minutes per session: Not on file  . Stress: Not on file  Relationships  . Social connections:    Talks on phone: Not on file    Gets together: Not on file    Attends religious service: Not on file    Active member of club or organization: Not on file    Attends meetings of clubs or organizations: Not on file    Relationship status: Not on file  Other Topics Concern  . Not on file  Social History Narrative  . Not on file   Outpatient Encounter Medications as of 08/11/2017  Medication Sig  . ACCU-CHEK AVIVA PLUS test strip CHECK BLOOD GLUCOSE TWICE DAILY  . ACCU-CHEK SOFTCLIX LANCETS lancets USE AS INSTRUCTED FOR ONCE DAILY TESTING  . atenolol (TENORMIN) 50 MG tablet Take 50 mg by mouth daily.  Marland Kitchen atorvastatin (LIPITOR) 80 MG tablet Take 80 mg by mouth daily.  . Insulin Glargine-Lixisenatide (SOLIQUA) 100-33 UNT-MCG/ML SOPN Inject 60 Units into the skin at bedtime.  .  Insulin Pen Needle (B-D ULTRAFINE III SHORT PEN) 31G X 8 MM MISC 1 each by Does not apply route as directed.  . Insulin Pen Needle (RELION PEN NEEDLE 31G/8MM) 31G X 8 MM MISC USE DAILY AS DIRECTED  . losartan (COZAAR) 25 MG tablet daily.  . metFORMIN (GLUMETZA) 1000 MG (MOD) 24 hr tablet Take 1 tablet (1,000 mg total) by mouth daily with breakfast.  . methylphenidate (DAYTRANA) 20 MG/9HR Place 1 patch onto the skin daily. wear patch for 9 hours only each day  . omeprazole (PRILOSEC) 20 MG capsule Take 20 mg by mouth daily.  . Teriflunomide 14 MG TABS Take by mouth.  . Vitamin D, Ergocalciferol, (DRISDOL) 50000 units CAPS capsule Take 50,000 Units by mouth every 7 (seven) days.  . [DISCONTINUED] amLODipine (NORVASC) 5 MG tablet Take 5 mg by mouth daily.  . [DISCONTINUED] metFORMIN (GLUCOPHAGE) 1000 MG tablet TAKE 1 TABLET BY MOUTH TWICE DAILY WITH MEALS   No facility-administered encounter medications on file as of 08/11/2017.    ALLERGIES: No Known Allergies VACCINATION STATUS:  There is no immunization history on file for this patient.  Diabetes  She presents for her follow-up diabetic  visit. She has type 2 diabetes mellitus. Onset time: She was diagnosed at approximate age of 44 years. Her disease course has been worsening. There are no hypoglycemic associated symptoms. Pertinent negatives for hypoglycemia include no confusion, headaches, pallor or seizures. There are no diabetic associated symptoms. Pertinent negatives for diabetes include no chest pain, no fatigue, no polydipsia, no polyphagia and no polyuria. There are no hypoglycemic complications. Symptoms are worsening. There are no diabetic complications. Risk factors for coronary artery disease include dyslipidemia, hypertension and obesity. Current diabetic treatment includes insulin injections and oral agent (dual therapy). She is compliant with treatment most of the time. Her weight is increasing steadily. She is following a generally  unhealthy diet. She has had a previous visit with a dietitian. She participates in exercise intermittently. There is no change in her home blood glucose trend. Her breakfast blood glucose range is generally 140-180 mg/dl. Her overall blood glucose range is 180-200 mg/dl. An ACE inhibitor/angiotensin II receptor blocker is being taken. Eye exam is current.  Hyperlipidemia  This is a chronic problem. The current episode started more than 1 year ago. Exacerbating diseases include diabetes and obesity. Pertinent negatives include no chest pain, myalgias or shortness of breath. Current antihyperlipidemic treatment includes statins. Risk factors for coronary artery disease include diabetes mellitus, dyslipidemia, a sedentary lifestyle and post-menopausal.  Hypertension  This is a chronic problem. The current episode started more than 1 year ago. Pertinent negatives include no chest pain, headaches, palpitations or shortness of breath. Risk factors for coronary artery disease include diabetes mellitus, dyslipidemia, post-menopausal state, sedentary lifestyle and smoking/tobacco exposure. Past treatments include ACE inhibitors.     Review of Systems  Constitutional: Negative for fatigue and unexpected weight change.  HENT: Negative for trouble swallowing and voice change.   Eyes: Negative for visual disturbance.  Respiratory: Negative for cough, shortness of breath and wheezing.   Cardiovascular: Negative for chest pain, palpitations and leg swelling.  Gastrointestinal: Negative for diarrhea, nausea and vomiting.  Endocrine: Negative for cold intolerance, heat intolerance, polydipsia, polyphagia and polyuria.  Musculoskeletal: Negative for arthralgias and myalgias.  Skin: Negative for color change, pallor, rash and wound.  Neurological: Negative for seizures and headaches.  Psychiatric/Behavioral: Negative for confusion and suicidal ideas.    Objective:    BP 132/81   Pulse 87   Ht 5\' 7"  (1.702 m)    Wt 210 lb (95.3 kg)   BMI 32.89 kg/m   Wt Readings from Last 3 Encounters:  08/11/17 210 lb (95.3 kg)  04/07/17 208 lb (94.3 kg)  01/01/17 205 lb (93 kg)    Physical Exam  Constitutional: She is oriented to person, place, and time. She appears well-developed.  HENT:  Head: Normocephalic and atraumatic.  Eyes: EOM are normal.  Neck: Normal range of motion. Neck supple. No tracheal deviation present. No thyromegaly present.  Cardiovascular: Normal rate.  Pulmonary/Chest: Effort normal.  Abdominal: There is no tenderness. There is no guarding.  Musculoskeletal: Normal range of motion. She exhibits no edema.  Neurological: She is alert and oriented to person, place, and time. No cranial nerve deficit. Coordination normal.  Skin: Skin is warm and dry. No rash noted. No erythema. No pallor.  Psychiatric: She has a normal mood and affect. Judgment normal.    Results for orders placed or performed in visit on 04/07/17  COMPLETE METABOLIC PANEL WITH GFR  Result Value Ref Range   Glucose, Bld 251 (H) 65 - 139 mg/dL   BUN 14 7 -  25 mg/dL   Creat 0.74 0.50 - 0.99 mg/dL   GFR, Est Non African American 86 > OR = 60 mL/min/1.71m2   GFR, Est African American 100 > OR = 60 mL/min/1.45m2   BUN/Creatinine Ratio NOT APPLICABLE 6 - 22 (calc)   Sodium 135 135 - 146 mmol/L   Potassium 4.3 3.5 - 5.3 mmol/L   Chloride 101 98 - 110 mmol/L   CO2 24 20 - 32 mmol/L   Calcium 9.8 8.6 - 10.4 mg/dL   Total Protein 6.8 6.1 - 8.1 g/dL   Albumin 4.4 3.6 - 5.1 g/dL   Globulin 2.4 1.9 - 3.7 g/dL (calc)   AG Ratio 1.8 1.0 - 2.5 (calc)   Total Bilirubin 0.3 0.2 - 1.2 mg/dL   Alkaline phosphatase (APISO) 69 33 - 130 U/L   AST 50 (H) 10 - 35 U/L   ALT 46 (H) 6 - 29 U/L  Hemoglobin A1c  Result Value Ref Range   Hgb A1c MFr Bld 8.9 (H) <5.7 % of total Hgb   Mean Plasma Glucose 209 (calc)   eAG (mmol/L) 11.6 (calc)   Lipid Panel     Component Value Date/Time   CHOL 144 09/04/2016 1036   TRIG 283 (H)  09/04/2016 1036   HDL 28 (L) 09/04/2016 1036   CHOLHDL 5.1 (H) 09/04/2016 1036   VLDL 57 (H) 09/04/2016 1036   LDLCALC 59 09/04/2016 1036     Assessment & Plan:   1. Uncontrolled type 2 diabetes mellitus with complication, with long-term current use of insulin ([place])  Patient came with loss of control of her diabetes with A1c of 8.9%, increasing from 7.4%.  She admits to recent  dietary discretion since her last visit.    Glucose logs and insulin administration records pertaining to this visit,  to be scanned into patient's records.  Recent labs reviewed. - Patient remains at a high risk for more acute and chronic complications of diabetes which include CAD, CVA, CKD, retinopathy, and neuropathy. These are all discussed in detail with the patient.  - I have re-counseled the patient on diet management and weight loss  by adopting a carbohydrate restricted / protein rich  Diet. - Patient is advised to stick to a routine mealtimes to eat 3 meals  a day and avoid unnecessary snacks ( to snack only to correct hypoglycemia).  -  Suggestion is made for her to avoid simple carbohydrates  from her diet including Cakes, Sweet Desserts / Pastries, Ice Cream, Soda (diet and regular), Sweet Tea, Candies, Chips, Cookies, Store Bought Juices, Alcohol in Excess of  1-2 drinks a day, Artificial Sweeteners, and "Sugar-free" Products. This will help patient to have stable blood glucose profile and potentially avoid unintended weight gain.   - I have approached patient with the following individualized plan to manage diabetes and patient agrees.   -Based on her presentation with A1c of 8.9%, her next plan of action is adding prandial insulin.  However, she promises to do better on her diet and exercise and would like to avoid this escalation for now.  -I advised her to continue Soliqua 60/20 units daily at bedtime associated with monitoring of blood glucose daily before breakfast.   -Patient is encouraged to  call clinic for blood glucose levels less than 70 or above 300 mg /dl. -I will continue metformin 1000 mg by mouth twice a day. - Patient specific target  for A1c; LDL, HDL, Triglycerides, and  Waist Circumference were discussed in detail.  2) BP/HTN: Her blood pressure is controlled to target.  She is advised to continue her current blood pressure medications including losartan 25 mg p.o. daily.     3) Lipids/HPL: She is advised to continue Lipitor 80 mg p.o. nightly.  Her recent lipid panel showed controlled LDL at 59.  She will be considered for lower dose of Lipitor after her next visit.   4)  Weight/Diet: CDE consult in progress, exercise, and carbohydrates information provided.  5) Chronic Care/Health Maintenance:  -Patient  on ACEI and Statin medications and encouraged to continue to follow up with Ophthalmology, Podiatrist at least yearly or according to recommendations, and advised to  stay away from smoking. I have recommended yearly flu vaccine and pneumonia vaccination at least every 5 years; moderate intensity exercise for up to 150 minutes weekly; and  sleep for at least 7 hours a day.  I advised patient to maintain close follow up with her PCP for primary care needs.  - Time spent with the patient: 25 min, of which >50% was spent in reviewing her blood glucose logs , discussing her hypo- and hyper-glycemic episodes, reviewing her current and  previous labs and insulin doses and developing a plan to avoid hypo- and hyper-glycemia. Please refer to Patient Instructions for Blood Glucose Monitoring and Insulin/Medications Dosing Guide"  in media tab for additional information. Royetta Crochet Ladley participated in the discussions, expressed understanding, and voiced agreement with the above plans.  All questions were answered to her satisfaction. she is encouraged to contact clinic should she have any questions or concerns prior to her return visit.   Follow up plan: Return in about 3  months (around 11/11/2017) for follow up with pre-visit labs, meter, and logs.  Glade Lloyd, MD Phone: [phone]  Fax: [phone]   -  This note was partially dictated with voice recognition software. Similar sounding words can be transcribed inadequately or may not  be corrected upon review.  08/11/2017, 4:00 PM

## 2017-08-13 ENCOUNTER — Other Ambulatory Visit: Payer: Self-pay

## 2017-08-13 MED ORDER — METFORMIN HCL ER 500 MG PO TB24: 1000.0000 mg | ORAL_TABLET | Freq: Every day | ORAL | 2 refills | Status: DC

## 2017-08-14 ENCOUNTER — Ambulatory Visit (HOSPITAL_COMMUNITY)
Admission: RE | Admit: 2017-08-14 | Discharge: 2017-08-14 | Disposition: A | Discharge status: Home / Self Care | Payer: Medicare PPO | Source: Ambulatory Visit | Attending: Family Medicine | Admitting: Family Medicine

## 2017-08-14 ENCOUNTER — Other Ambulatory Visit (HOSPITAL_COMMUNITY): Payer: Self-pay | Admitting: Family Medicine

## 2017-08-14 ENCOUNTER — Encounter (HOSPITAL_COMMUNITY): Payer: Self-pay | Admitting: Radiology

## 2017-08-14 ENCOUNTER — Other Ambulatory Visit: Payer: Self-pay

## 2017-08-14 DIAGNOSIS — Z1231 Encounter for screening mammogram for malignant neoplasm of breast: Secondary | ICD-10-CM | POA: Insufficient documentation

## 2017-08-14 MED ORDER — METFORMIN HCL ER 500 MG PO TB24: 1000.0000 mg | ORAL_TABLET | Freq: Every day | ORAL | 2 refills | Status: DC

## 2017-09-21 ENCOUNTER — Other Ambulatory Visit: Payer: Self-pay | Admitting: "Endocrinology

## 2017-10-16 ENCOUNTER — Other Ambulatory Visit: Payer: Self-pay

## 2017-10-16 MED ORDER — INSULIN PEN NEEDLE 31G X 8 MM MISC: 1.0000 | Freq: Four times a day (QID) | 3 refills | Status: DC

## 2017-11-04 LAB — COMPLETE METABOLIC PANEL WITH GFR
AG Ratio: 1.8 (calc) (ref 1.0–2.5)
ALKALINE PHOSPHATASE (APISO): 64 U/L (ref 33–130)
ALT: 44 U/L — ABNORMAL HIGH (ref 6–29)
AST: 35 U/L (ref 10–35)
Albumin: 4.4 g/dL (ref 3.6–5.1)
BUN: 15 mg/dL (ref 7–25)
CO2: 25 mmol/L (ref 20–32)
CREATININE: 0.84 mg/dL (ref 0.50–0.99)
Calcium: 9.6 mg/dL (ref 8.6–10.4)
Chloride: 104 mmol/L (ref 98–110)
GFR, EST NON AFRICAN AMERICAN: 73 mL/min/{1.73_m2} (ref >=60)
GFR, Est African American: 85 mL/min/{1.73_m2} (ref >=60)
Globulin: 2.4 g/dL (calc) (ref 1.9–3.7)
Glucose, Bld: 135 mg/dL (ref 65–139)
Potassium: 4.2 mmol/L (ref 3.5–5.3)
Sodium: 138 mmol/L (ref 135–146)
TOTAL PROTEIN: 6.8 g/dL (ref 6.1–8.1)
Total Bilirubin: 0.3 mg/dL (ref 0.2–1.2)

## 2017-11-04 LAB — HEMOGLOBIN A1C
EAG (MMOL/L): 11.7 (calc)
Hgb A1c MFr Bld: 9 % of total Hgb — ABNORMAL HIGH (ref <5.7)
MEAN PLASMA GLUCOSE: 212 (calc)

## 2017-11-11 ENCOUNTER — Ambulatory Visit: Payer: Medicare PPO | Admitting: "Endocrinology

## 2017-11-11 ENCOUNTER — Encounter: Payer: Self-pay | Admitting: "Endocrinology

## 2017-11-11 VITALS — BP 133/81 | HR 96 | Ht 67.0 in | Wt 207.0 lb

## 2017-11-11 DIAGNOSIS — I10 Essential (primary) hypertension: Secondary | ICD-10-CM

## 2017-11-11 DIAGNOSIS — IMO0002 Reserved for concepts with insufficient information to code with codable children: Secondary | ICD-10-CM

## 2017-11-11 DIAGNOSIS — E782 Mixed hyperlipidemia: Secondary | ICD-10-CM | POA: Diagnosis not present

## 2017-11-11 DIAGNOSIS — Z794 Long term (current) use of insulin: Secondary | ICD-10-CM

## 2017-11-11 DIAGNOSIS — E118 Type 2 diabetes mellitus with unspecified complications: Secondary | ICD-10-CM | POA: Diagnosis not present

## 2017-11-11 DIAGNOSIS — E1165 Type 2 diabetes mellitus with hyperglycemia: Secondary | ICD-10-CM

## 2017-11-11 NOTE — Progress Notes (Signed)
Endocrinology follow-up note    Subjective:    Patient ID: Melanie Schultz, female    DOB: 08-07-1955,    Past Medical History:  Diagnosis Date  . Diabetes mellitus without complication ([place])   . Multiple sclerosis ([place])    Past Surgical History:  Procedure Laterality Date  . BREAST BIOPSY Left   . BREAST EXCISIONAL BIOPSY Left    benign  . CESAREAN SECTION    . NASAL SEPTUM SURGERY     Social History   Socioeconomic History  . Marital status: Married    Spouse name: Not on file  . Number of children: Not on file  . Years of education: Not on file  . Highest education level: Not on file  Occupational History  . Not on file  Social Needs  . Financial resource strain: Not on file  . Food insecurity:    Worry: Not on file    Inability: Not on file  . Transportation needs:    Medical: Not on file    Non-medical: Not on file  Tobacco Use  . Smoking status: Former Research scientist (life sciences)  . Smokeless tobacco: Never Used  Substance and Sexual Activity  . Alcohol use: No    Alcohol/week: 0.0 standard drinks  . Drug use: No  . Sexual activity: Not on file  Lifestyle  . Physical activity:    Days per week: Not on file    Minutes per session: Not on file  . Stress: Not on file  Relationships  . Social connections:    Talks on phone: Not on file    Gets together: Not on file    Attends religious service: Not on file    Active member of club or organization: Not on file    Attends meetings of clubs or organizations: Not on file    Relationship status: Not on file  Other Topics Concern  . Not on file  Social History Narrative  . Not on file   Outpatient Encounter Medications as of 11/11/2017  Medication Sig  . ACCU-CHEK AVIVA PLUS test strip CHECK BLOOD GLUCOSE TWICE DAILY  . ACCU-CHEK SOFTCLIX LANCETS lancets USE AS INSTRUCTED FOR ONCE DAILY TESTING  . atenolol (TENORMIN) 50 MG tablet Take 50 mg by mouth daily.  Marland Kitchen atorvastatin (LIPITOR) 40 MG tablet Take 1 tablet (40 mg total)  by mouth daily.  . Insulin Pen Needle (B-D ULTRAFINE III SHORT PEN) 31G X 8 MM MISC 1 each by Does not apply route 4 (four) times daily.  . Insulin Pen Needle (RELION PEN NEEDLE 31G/8MM) 31G X 8 MM MISC USE DAILY AS DIRECTED  . losartan (COZAAR) 25 MG tablet daily.  . metFORMIN (GLUCOPHAGE-XR) 500 MG 24 hr tablet Take 2 tablets (1,000 mg total) by mouth daily with breakfast.  . methylphenidate (DAYTRANA) 20 MG/9HR Place 1 patch onto the skin daily. wear patch for 9 hours only each day  . omeprazole (PRILOSEC) 20 MG capsule Take 20 mg by mouth daily.  . SOLIQUA 100-33 UNT-MCG/ML SOPN INJECT 60 UNITS SUBCUTANEOUSLY AT BEDTIME  . Teriflunomide 14 MG TABS Take by mouth.  . Vitamin D, Ergocalciferol, (DRISDOL) 50000 units CAPS capsule Take 50,000 Units by mouth every 7 (seven) days.   No facility-administered encounter medications on file as of 11/11/2017.    ALLERGIES: No Known Allergies VACCINATION STATUS:  There is no immunization history on file for this patient.  Diabetes  She presents for her follow-up diabetic visit. She has type 2 diabetes mellitus. Onset time: She  was diagnosed at approximate age of 46 years. Her disease course has been worsening. There are no hypoglycemic associated symptoms. Pertinent negatives for hypoglycemia include no confusion, headaches, pallor or seizures. There are no diabetic associated symptoms. Pertinent negatives for diabetes include no chest pain, no fatigue, no polydipsia, no polyphagia and no polyuria. There are no hypoglycemic complications. Symptoms are worsening. There are no diabetic complications. Risk factors for coronary artery disease include dyslipidemia, hypertension and obesity. Current diabetic treatment includes insulin injections and oral agent (dual therapy). She is compliant with treatment most of the time. Her weight is fluctuating minimally. She is following a generally unhealthy diet. She has had a previous visit with a dietitian. She  participates in exercise intermittently. There is no change in her home blood glucose trend. Her breakfast blood glucose range is generally 140-180 mg/dl. Her dinner blood glucose range is generally 180-200 mg/dl. Her overall blood glucose range is 140-180 mg/dl. An ACE inhibitor/angiotensin II receptor blocker is being taken. Eye exam is current.  Hyperlipidemia  This is a chronic problem. The current episode started more than 1 year ago. Exacerbating diseases include diabetes and obesity. Pertinent negatives include no chest pain, myalgias or shortness of breath. Current antihyperlipidemic treatment includes statins. Risk factors for coronary artery disease include diabetes mellitus, dyslipidemia, a sedentary lifestyle and post-menopausal.  Hypertension  This is a chronic problem. The current episode started more than 1 year ago. Pertinent negatives include no chest pain, headaches, palpitations or shortness of breath. Risk factors for coronary artery disease include diabetes mellitus, dyslipidemia, post-menopausal state, sedentary lifestyle and smoking/tobacco exposure. Past treatments include ACE inhibitors.     Review of Systems  Constitutional: Negative for fatigue and unexpected weight change.  HENT: Negative for trouble swallowing and voice change.   Eyes: Negative for visual disturbance.  Respiratory: Negative for cough, shortness of breath and wheezing.   Cardiovascular: Negative for chest pain, palpitations and leg swelling.  Gastrointestinal: Negative for diarrhea, nausea and vomiting.  Endocrine: Negative for cold intolerance, heat intolerance, polydipsia, polyphagia and polyuria.  Musculoskeletal: Negative for arthralgias and myalgias.  Skin: Negative for color change, pallor, rash and wound.  Neurological: Negative for seizures and headaches.  Psychiatric/Behavioral: Negative for confusion and suicidal ideas.    Objective:    BP 133/81   Pulse 96   Ht 5\' 7"  (1.702 m)   Wt  207 lb (93.9 kg)   BMI 32.42 kg/m   Wt Readings from Last 3 Encounters:  11/11/17 207 lb (93.9 kg)  08/11/17 210 lb (95.3 kg)  04/07/17 208 lb (94.3 kg)    Physical Exam  Constitutional: She is oriented to person, place, and time. She appears well-developed.  HENT:  Head: Normocephalic and atraumatic.  Eyes: EOM are normal.  Neck: Normal range of motion. Neck supple. No tracheal deviation present. No thyromegaly present.  Cardiovascular: Normal rate.  Pulmonary/Chest: Effort normal.  Abdominal: There is no tenderness. There is no guarding.  Musculoskeletal: Normal range of motion. She exhibits no edema.  Neurological: She is alert and oriented to person, place, and time. No cranial nerve deficit. Coordination normal.  Skin: Skin is warm and dry. No rash noted. No erythema. No pallor.  Psychiatric: She has a normal mood and affect. Judgment normal.    Results for orders placed or performed in visit on 08/11/17  COMPLETE METABOLIC PANEL WITH GFR  Result Value Ref Range   Glucose, Bld 135 65 - 139 mg/dL   BUN 15 7 - 25 mg/dL  Creat 0.84 0.50 - 0.99 mg/dL   GFR, Est Non African American 73 > OR = 60 mL/min/1.29m2   GFR, Est African American 85 > OR = 60 mL/min/1.47m2   BUN/Creatinine Ratio NOT APPLICABLE 6 - 22 (calc)   Sodium 138 135 - 146 mmol/L   Potassium 4.2 3.5 - 5.3 mmol/L   Chloride 104 98 - 110 mmol/L   CO2 25 20 - 32 mmol/L   Calcium 9.6 8.6 - 10.4 mg/dL   Total Protein 6.8 6.1 - 8.1 g/dL   Albumin 4.4 3.6 - 5.1 g/dL   Globulin 2.4 1.9 - 3.7 g/dL (calc)   AG Ratio 1.8 1.0 - 2.5 (calc)   Total Bilirubin 0.3 0.2 - 1.2 mg/dL   Alkaline phosphatase (APISO) 64 33 - 130 U/L   AST 35 10 - 35 U/L   ALT 44 (H) 6 - 29 U/L  Hemoglobin A1c  Result Value Ref Range   Hgb A1c MFr Bld 9.0 (H) <5.7 % of total Hgb   Mean Plasma Glucose 212 (calc)   eAG (mmol/L) 11.7 (calc)   Lipid Panel     Component Value Date/Time   CHOL 144 09/04/2016 1036   TRIG 283 (H) 09/04/2016 1036    HDL 28 (L) 09/04/2016 1036   CHOLHDL 5.1 (H) 09/04/2016 1036   VLDL 57 (H) 09/04/2016 1036   LDLCALC 59 09/04/2016 1036     Assessment & Plan:   1. Uncontrolled type 2 diabetes mellitus with complication, with long-term current use of insulin ([place])  Patient came with sleep currently above target glycemic profile and A1c of 9% unchanged from last visit.    Glucose logs and insulin administration records pertaining to this visit,  to be scanned into patient's records.  Recent labs reviewed. - Patient remains at a high risk for more acute and chronic complications of diabetes which include CAD, CVA, CKD, retinopathy, and neuropathy. These are all discussed in detail with the patient.  - I have re-counseled the patient on diet management and weight loss  by adopting a carbohydrate restricted / protein rich  Diet. - Patient is advised to stick to a routine mealtimes to eat 3 meals  a day and avoid unnecessary snacks ( to snack only to correct hypoglycemia).  -She still admits to dietary indiscretions including consumption of sweetened beverages.  -  Suggestion is made for her to avoid simple carbohydrates  from her diet including Cakes, Sweet Desserts / Pastries, Ice Cream, Soda (diet and regular), Sweet Tea, Candies, Chips, Cookies, Store Bought Juices, Alcohol in Excess of  1-2 drinks a day, Artificial Sweeteners, and "Sugar-free" Products. This will help patient to have stable blood glucose profile and potentially avoid unintended weight gain.  - I have approached patient with the following individualized plan to manage diabetes and patient agrees.   -Based on her presentation with A1c of 9%, her next plan of action is adding prandial insulin.  However, she promises to do better on her diet and exercise and would like to avoid this escalation for now.  -I advised her to continue Soliqua 60/20 units daily at bedtime associated with monitoring of blood glucose daily before breakfast.    -Patient is encouraged to call clinic for blood glucose levels less than 70 or above 300 mg /dl. -I will continue metformin 1000 mg by mouth twice a day. - Patient specific target  for A1c; LDL, HDL, Triglycerides, and  Waist Circumference were discussed in detail.  2) BP/HTN: Her blood pressure  is controlled to target.  She is advised to continue her current blood pressure medications including losartan 25 mg p.o. daily.    3) Lipids/HPL: She is advised to continue Lipitor 80 mg p.o. nightly.  Her recent lipid panel showed controlled LDL at 59.  She will be considered for lower dose of Lipitor after her next visit.   4)  Weight/Diet: CDE consult in progress, exercise, and carbohydrates information provided.  5) Chronic Care/Health Maintenance:  -Patient  on ACEI and Statin medications and encouraged to continue to follow up with Ophthalmology, Podiatrist at least yearly or according to recommendations, and advised to  stay away from smoking. I have recommended yearly flu vaccine and pneumonia vaccination at least every 5 years; moderate intensity exercise for up to 150 minutes weekly; and  sleep for at least 7 hours a day.  I advised patient to maintain close follow up with her PCP for primary care needs.  - Time spent with the patient: 25 min, of which >50% was spent in reviewing her blood glucose logs , discussing her hypo- and hyper-glycemic episodes, reviewing her current and  previous labs and insulin doses and developing a plan to avoid hypo- and hyper-glycemia. Please refer to Patient Instructions for Blood Glucose Monitoring and Insulin/Medications Dosing Guide"  in media tab for additional information. Royetta Crochet Winsor participated in the discussions, expressed understanding, and voiced agreement with the above plans.  All questions were answered to her satisfaction. she is encouraged to contact clinic should she have any questions or concerns prior to her return visit.  Follow up  plan: Return in about 3 months (around 02/11/2018) for Follow up with Pre-visit Labs, Meter, and Logs.  Glade Lloyd, MD Phone: [phone]  Fax: [phone]   -  This note was partially dictated with voice recognition software. Similar sounding words can be transcribed inadequately or may not  be corrected upon review.  11/11/2017, 5:28 PM

## 2017-11-11 NOTE — Patient Instructions (Signed)

## 2017-12-24 ENCOUNTER — Telehealth: Payer: Self-pay | Admitting: Orthopedic Surgery

## 2017-12-24 NOTE — Telephone Encounter (Signed)
Patient brought medical records/notes from urgent care at [facility] and films, for review and advice, regarding appointment-pending.

## 2017-12-31 NOTE — Telephone Encounter (Signed)
Per Dr Ruthe Mannan review, received film and radiology report only, no medical records from doctor.  Called patient,  left voice message for patient to return call.

## 2018-01-05 NOTE — Telephone Encounter (Signed)
Patient returned call, and I relayed per Dr Ruthe Mannan response, that provider's notes/medical records from [facility] are needed. Patient may come here to sign the records request. Appt pending.

## 2018-01-06 NOTE — Telephone Encounter (Signed)
On 01/05/18, Patient signed release here at our site. Faxed 01/05/18.

## 2018-01-07 ENCOUNTER — Ambulatory Visit: Payer: Medicare PPO | Admitting: Orthopaedic Surgery

## 2018-01-12 NOTE — Telephone Encounter (Signed)
01/12/18 Spoke with patient upon Dr Ruthe Mannan review and approval; scheduled appointment; patient aware.

## 2018-01-20 ENCOUNTER — Other Ambulatory Visit (HOSPITAL_COMMUNITY): Payer: Self-pay | Admitting: Neurology

## 2018-01-20 DIAGNOSIS — G35 Multiple sclerosis: Secondary | ICD-10-CM

## 2018-01-20 DIAGNOSIS — E559 Vitamin D deficiency, unspecified: Secondary | ICD-10-CM

## 2018-01-20 DIAGNOSIS — Z5181 Encounter for therapeutic drug level monitoring: Secondary | ICD-10-CM

## 2018-01-26 ENCOUNTER — Ambulatory Visit: Payer: Medicare PPO | Admitting: Orthopedic Surgery

## 2018-01-26 ENCOUNTER — Telehealth: Payer: Self-pay | Admitting: Radiology

## 2018-01-26 ENCOUNTER — Encounter: Payer: Self-pay | Admitting: Orthopedic Surgery

## 2018-01-26 VITALS — BP 129/85 | HR 102 | Ht 65.0 in | Wt 202.0 lb

## 2018-01-26 DIAGNOSIS — M23321 Other meniscus derangements, posterior horn of medial meniscus, right knee: Secondary | ICD-10-CM | POA: Diagnosis not present

## 2018-01-26 NOTE — Progress Notes (Signed)
NEW PATIENT OFFICE VISIT  Chief Complaint  Patient presents with  . Knee Pain    Right knee pain, no injury. She is going to PT for this problem.    62 year old female presents for evaluation of right knee pain  Referral from [facility] in [place]  She says 5 weeks ago she was walking her right knee gave way and since that time she had increasing pain in her right knee primarily on the medial side of the joint with giving way symptoms moderately severe pain decreased range of motion and some swelling.  She was treated with ibuprofen Voltaren gel brace and tramadol muscle relaxer  She is also in physical therapy   Review of Systems  Musculoskeletal: Positive for back pain.  Neurological: Positive for tingling.       History of sciatica right side     Past Medical History:  Diagnosis Date  . Diabetes mellitus without complication ([place])   . Multiple sclerosis ([place])     Past Surgical History:  Procedure Laterality Date  . BREAST BIOPSY Left   . BREAST EXCISIONAL BIOPSY Left    benign  . CESAREAN SECTION    . NASAL SEPTUM SURGERY      Family History  Problem Relation Age of Onset  . Hypertension Mother   . Cancer Mother    Social History   Tobacco Use  . Smoking status: Former Research scientist (life sciences)  . Smokeless tobacco: Never Used  Substance Use Topics  . Alcohol use: No    Alcohol/week: 0.0 standard drinks  . Drug use: No    No Known Allergies  No outpatient medications have been marked as taking for the 01/26/18 encounter (Office Visit) with Carole Civil, MD.    BP 129/85   Pulse (!) 102   Ht 5\' 5"  (1.651 m)   Wt 202 lb (91.6 kg)   BMI 33.61 kg/m   Physical Exam Vitals signs reviewed.  Constitutional:      Appearance: She is well-developed.  Neurological:     Mental Status: She is alert and oriented to person, place, and time.  Psychiatric:        Judgment: Judgment normal.     Ortho Exam  Right and left upper extremity  normal alignment range of motion no instability of the joint strength and muscle tone normal skin is intact pulse and perfusion are normal there is no lymphadenopathy sensation is intact no pathologic reflexes  She has tenderness in her lumbar spine on the right with a negative right straight leg raise  Left lower extremity knee joint no swelling full range of motion ligaments are stable strength is normal skin is intact pulses good temperature is normal lymph nodes are negative sensation is normal pathologic reflexes none balance is excellent  Right knee there is a 5 degrees loss of extension she flexes to 125 degrees she is tender over the medial joint line with small joint effusion knee is stable she has a positive McMurray sign for medial joint pain and medial meniscal tear strength and muscle tone normal no tremor skin is normal pulse and temperature normal with no edema lymph nodes are negative sensation is intact    MEDICAL DECISION SECTION  Xrays were done at X-rays were brought in on the disc I put the pictures in the chart she has arthritic changes of the knee joint mild  My independent reading of xrays:  Slight medial and lateral joint space narrowing distant with mild arthritis  Encounter Diagnosis  Name Primary?  . Derangement of posterior horn of medial meniscus of right knee Yes    PLAN: (Rx., injectx, surgery, frx, mri/ct) Recommend MRI of the knee patient has signs and symptoms of medial meniscal tear, suspect arthroscopic meniscectomy will be required  Follow-up after MRI  Continue ibuprofen Voltaren gel and physical therapy  No orders of the defined types were placed in this encounter.   Arther Abbott, MD  01/26/2018 9:31 AM

## 2018-01-26 NOTE — Patient Instructions (Addendum)
Continue therapy, ibuprofen and Voltaren gel  You will be scheduled for MRI of the right knee   Meniscus Tear  A meniscus tear is a knee injury that happens when a piece of the meniscus is torn. The meniscus is a thick, rubbery, wedge-shaped cartilage in the knee. Two menisci are located in each knee. They sit between the upper bone (femur) and lower bone (tibia) that make up the knee joint. Each meniscus acts as a shock absorber for the knee. A torn meniscus is one of the most common types of knee injuries. This injury can range from mild to severe. Surgery may be needed to repair a severe tear. What are the causes? This condition may be caused by any kneeling, squatting, twisting, or pivoting movement. Sports-related injuries are the most common cause. These often occur from:  Running and stopping suddenly. ? Changing direction. ? Being tackled or knocked off your feet.  Lifting or carrying heavy weights. As people get older, their menisci get thinner and weaker. In these people, tears can happen more easily, such as from climbing stairs. What increases the risk? You are more likely to develop this condition if you:  Play contact sports.  Have a job that requires kneeling or squatting.  Are female.  Are over 20 years old. What are the signs or symptoms? Symptoms of this condition include:  Knee pain, especially at the side of the knee joint. You may feel pain when the injury occurs, or you may only hear a pop and feel pain later.  A feeling that your knee is clicking, catching, locking, or giving way.  Not being able to fully bend or extend your knee.  Bruising or swelling in your knee. How is this diagnosed? This condition may be diagnosed based on your symptoms and a physical exam. You may also have tests, such as:  X-rays.  MRI.  A procedure to look inside your knee with a narrow surgical telescope (arthroscopy). You may be referred to a knee specialist (orthopedic  surgeon). How is this treated? Treatment for this injury depends on the severity of the tear. Treatment for a mild tear may include:  Rest.  Medicine to reduce pain and swelling. This is usually a nonsteroidal anti-inflammatory drug (NSAID), like ibuprofen.  A knee brace, sleeve, or wrap.  Using crutches or a walker to keep weight off your knee and to help you walk.  Exercises to strengthen your knee (physical therapy). You may need surgery if you have a severe tear or if other treatments are not working. Follow these instructions at home: If you have a brace, sleeve, or wrap:  Wear it as told by your health care provider. Remove it only as told by your health care provider.  Loosen the brace, sleeve, or wrap if your toes tingle, become numb, or turn cold and blue.  Keep the brace, sleeve, or wrap clean and dry.  If the brace, sleeve, or wrap is not waterproof: ? Do not let it get wet. ? Cover it with a watertight covering when you take a bath or shower. Managing pain and swelling   Take over-the-counter and prescription medicines only as told by your health care provider.  If directed, put ice on your knee: ? If you have a removable brace, sleeve, or wrap, remove it as told by your health care provider. ? Put ice in a plastic bag. ? Place a towel between your skin and the bag. ? Leave the ice on for 20  minutes, 2-3 times per day.  Move your toes often to avoid stiffness and to lessen swelling.  Raise (elevate) the injured area above the level of your heart while you are sitting or lying down. Activity  Do not use the injured limb to support your body weight until your health care provider says that you can. Use crutches or a walker as told by your health care provider.  Return to your normal activities as told by your health care provider. Ask your health care provider what activities are safe for you.  Perform range-of-motion exercises only as told by your health care  provider.  Begin doing exercises to strengthen your knee and leg muscles only as told by your health care provider. After you recover, your health care provider may recommend these exercises to help prevent another injury. General instructions  Use a knee brace, sleeve, or wrap as told by your health care provider.  Ask your health care provider when it is safe to drive if you have a brace, sleeve, or wrap on your knee.  Do not use any products that contain nicotine or tobacco, such as cigarettes, e-cigarettes, and chewing tobacco. If you need help quitting, ask your health care provider.  Ask your health care provider if the medicine prescribed to you: ? Requires you to avoid driving or using heavy machinery. ? Can cause constipation. You may need to take these actions to prevent or treat constipation:  Drink enough fluid to keep your urine pale yellow.  Take over-the-counter or prescription medicines.  Eat foods that are high in fiber, such as beans, whole grains, and fresh fruits and vegetables.  Limit foods that are high in fat and processed sugars, such as fried or sweet foods.  Keep all follow-up visits as told by your health care provider. This is important. Contact a health care provider if:  You have a fever.  Your knee becomes red, tender, or swollen.  Your pain medicine is not helping.  Your symptoms get worse or do not improve after 2 weeks of home care. Summary  A meniscus tear is a knee injury that happens when a piece of the meniscus is torn.  Treatment for this injury depends on the severity of the tear. You may need surgery if you have a severe tear or if other treatments are not working.  Rest, ice, and raise (elevate) your injured knee as told by your health care provider. This will help lessen pain and swelling.  Contact a health care provider if you have new symptoms, or your symptoms get worse or do not improve after 2 weeks of home care.  Keep all  follow-up visits as told by your health care provider. This is important. This information is not intended to replace advice given to you by your health care provider. Make sure you discuss any questions you have with your health care provider. Document Released: 04/13/2002 Document Revised: 08/05/2017 Document Reviewed: 08/05/2017 Elsevier Interactive Patient Education  2019 [employer].

## 2018-01-26 NOTE — Telephone Encounter (Signed)
Have gotten prior auth. Called to schedule. Melanie Schultz does not have any openings until after Jan 3rd, this is when her new insurance starts, does she want to go to [facility] or wait until Jan 3rd, will have to do [place] with new insurance if she watns in Jan.   Left message for patient to call back and let me know.

## 2018-01-26 NOTE — Addendum Note (Signed)
Addended byCandice Camp on: 01/26/2018 09:40 AM   Modules accepted: Orders

## 2018-01-27 ENCOUNTER — Ambulatory Visit (HOSPITAL_COMMUNITY)
Admission: RE | Admit: 2018-01-27 | Discharge: 2018-01-27 | Disposition: A | Discharge status: Home / Self Care | Payer: Medicare PPO | Source: Ambulatory Visit | Attending: Neurology | Admitting: Neurology

## 2018-01-27 DIAGNOSIS — E559 Vitamin D deficiency, unspecified: Secondary | ICD-10-CM

## 2018-01-27 DIAGNOSIS — Z5181 Encounter for therapeutic drug level monitoring: Secondary | ICD-10-CM | POA: Diagnosis not present

## 2018-01-27 DIAGNOSIS — R9082 White matter disease, unspecified: Secondary | ICD-10-CM | POA: Diagnosis not present

## 2018-01-27 DIAGNOSIS — G35 Multiple sclerosis: Secondary | ICD-10-CM | POA: Insufficient documentation

## 2018-01-29 ENCOUNTER — Telehealth: Payer: Self-pay | Admitting: Orthopedic Surgery

## 2018-01-29 ENCOUNTER — Other Ambulatory Visit: Payer: Self-pay | Admitting: "Endocrinology

## 2018-01-29 NOTE — Telephone Encounter (Signed)
Melanie Schultz came by this afternoon wanting to know when her MRI was scheduled.  I told her that I did not think approval had not been obtained as of yet.  She reminded me that she was seen on Monday,the 23rd.  I reminded her that today was just Thursday, the 26th and with the holidays, it may be a little longer than usual.    She asked if you could check on this and please call her with MRI appointment as soon as possible.  I told her that I would relay the message.

## 2018-01-30 NOTE — Telephone Encounter (Signed)
I left message for her on Monday to see if she wanted to go to [place], apparently she did not get my message  I called AGAIN, left another message for her, to let her know will need to be done after the first of the year, Melanie Schultz does not have openings until then.  Too late now to schedule in [place], they also now do not have availability (was hoping to speak to her on Monday)   Need to know what her insurance will be the first of the year to get the approval.

## 2018-02-02 NOTE — Telephone Encounter (Signed)
Patient returned call; relays the following insurance information, effective 02/04/2018:  [employer]  Member ID# [id number] / Group# [id number]  Provider ph# [phone]

## 2018-02-03 ENCOUNTER — Telehealth: Payer: Self-pay | Admitting: Radiology

## 2018-02-03 NOTE — Telephone Encounter (Signed)
This does not require prior auth, will call to schedule at [facility]

## 2018-02-03 NOTE — Telephone Encounter (Signed)
Left message for patient about MRI scheduled for Jan7th, at 10 am, arrive at [facility] 9:30 for the scan, and to call us back and make a follow up appointment with Dr Aline Brochure

## 2018-02-06 LAB — COMPLETE METABOLIC PANEL WITH GFR
AG Ratio: 1.8 (calc) (ref 1.0–2.5)
ALBUMIN MSPROF: 4.5 g/dL (ref 3.6–5.1)
ALKALINE PHOSPHATASE (APISO): 68 U/L (ref 33–130)
ALT: 41 U/L — AB (ref 6–29)
AST: 32 U/L (ref 10–35)
BUN: 14 mg/dL (ref 7–25)
CO2: 28 mmol/L (ref 20–32)
CREATININE: 0.63 mg/dL (ref 0.50–0.99)
Calcium: 9.5 mg/dL (ref 8.6–10.4)
Chloride: 100 mmol/L (ref 98–110)
GFR, Est African American: 110 mL/min/{1.73_m2} (ref >=60)
GFR, Est Non African American: 95 mL/min/{1.73_m2} (ref >=60)
GLUCOSE: 207 mg/dL — AB (ref 65–139)
Globulin: 2.5 g/dL (calc) (ref 1.9–3.7)
Potassium: 4.4 mmol/L (ref 3.5–5.3)
Sodium: 136 mmol/L (ref 135–146)
TOTAL PROTEIN: 7 g/dL (ref 6.1–8.1)
Total Bilirubin: 0.4 mg/dL (ref 0.2–1.2)

## 2018-02-06 LAB — HEMOGLOBIN A1C
EAG (MMOL/L): 11.4 (calc)
HEMOGLOBIN A1C: 8.8 %{Hb} — AB (ref <5.7)
MEAN PLASMA GLUCOSE: 206 (calc)

## 2018-02-10 ENCOUNTER — Ambulatory Visit (HOSPITAL_COMMUNITY)
Admission: RE | Admit: 2018-02-10 | Discharge: 2018-02-10 | Disposition: A | Discharge status: Home / Self Care | Payer: Medicare Other | Source: Ambulatory Visit | Attending: Orthopedic Surgery | Admitting: Orthopedic Surgery

## 2018-02-10 DIAGNOSIS — M23321 Other meniscus derangements, posterior horn of medial meniscus, right knee: Secondary | ICD-10-CM | POA: Insufficient documentation

## 2018-02-11 ENCOUNTER — Ambulatory Visit: Payer: Medicare Other | Admitting: "Endocrinology

## 2018-02-11 ENCOUNTER — Encounter: Payer: Self-pay | Admitting: "Endocrinology

## 2018-02-11 VITALS — BP 143/85 | HR 89 | Ht 65.0 in | Wt 203.0 lb

## 2018-02-11 DIAGNOSIS — E1165 Type 2 diabetes mellitus with hyperglycemia: Secondary | ICD-10-CM | POA: Diagnosis not present

## 2018-02-11 DIAGNOSIS — E782 Mixed hyperlipidemia: Secondary | ICD-10-CM | POA: Diagnosis not present

## 2018-02-11 DIAGNOSIS — E118 Type 2 diabetes mellitus with unspecified complications: Secondary | ICD-10-CM | POA: Diagnosis not present

## 2018-02-11 DIAGNOSIS — IMO0002 Reserved for concepts with insufficient information to code with codable children: Secondary | ICD-10-CM

## 2018-02-11 DIAGNOSIS — I10 Essential (primary) hypertension: Secondary | ICD-10-CM | POA: Diagnosis not present

## 2018-02-11 DIAGNOSIS — Z794 Long term (current) use of insulin: Secondary | ICD-10-CM

## 2018-02-11 MED ORDER — METFORMIN HCL ER 500 MG PO TB24: 1000.0000 mg | ORAL_TABLET | Freq: Every day | ORAL | 3 refills | Status: DC

## 2018-02-11 NOTE — Patient Instructions (Signed)

## 2018-02-11 NOTE — Progress Notes (Signed)
Endocrinology follow-up note    Subjective:    Patient ID: Melanie Schultz, female    DOB: 05-Apr-1955,    Past Medical History:  Diagnosis Date  . Diabetes mellitus without complication ([place])   . Multiple sclerosis ([place])    Past Surgical History:  Procedure Laterality Date  . BREAST BIOPSY Left   . BREAST EXCISIONAL BIOPSY Left    benign  . CESAREAN SECTION    . NASAL SEPTUM SURGERY     Social History   Socioeconomic History  . Marital status: Married    Spouse name: Not on file  . Number of children: Not on file  . Years of education: Not on file  . Highest education level: Not on file  Occupational History  . Not on file  Social Needs  . Financial resource strain: Not on file  . Food insecurity:    Worry: Not on file    Inability: Not on file  . Transportation needs:    Medical: Not on file    Non-medical: Not on file  Tobacco Use  . Smoking status: Former Research scientist (life sciences)  . Smokeless tobacco: Never Used  Substance and Sexual Activity  . Alcohol use: No    Alcohol/week: 0.0 standard drinks  . Drug use: No  . Sexual activity: Not on file  Lifestyle  . Physical activity:    Days per week: Not on file    Minutes per session: Not on file  . Stress: Not on file  Relationships  . Social connections:    Talks on phone: Not on file    Gets together: Not on file    Attends religious service: Not on file    Active member of club or organization: Not on file    Attends meetings of clubs or organizations: Not on file    Relationship status: Not on file  Other Topics Concern  . Not on file  Social History Narrative  . Not on file   Outpatient Encounter Medications as of 02/11/2018  Medication Sig  . methylphenidate (RITALIN) 10 MG tablet Take 40 mg by mouth daily.  Marland Kitchen atenolol (TENORMIN) 50 MG tablet Take 50 mg by mouth 2 (two) times daily.   Marland Kitchen atorvastatin (LIPITOR) 40 MG tablet Take 1 tablet (40 mg total) by mouth daily.  Marland Kitchen losartan (COZAAR) 25 MG tablet daily.  .  metFORMIN (GLUCOPHAGE-XR) 500 MG 24 hr tablet Take 2 tablets (1,000 mg total) by mouth daily with breakfast.  . omeprazole (PRILOSEC) 20 MG capsule Take 20 mg by mouth daily.  Willeen Niece 100-33 UNT-MCG/ML SOPN INJECT 50 UNITS SUBCUTANEOUSLY ONCE DAILY WITH BREAKFAST (Patient taking differently: Inject 60 Units into the skin daily. )  . Teriflunomide 14 MG TABS Take by mouth.  . Vitamin D, Ergocalciferol, (DRISDOL) 50000 units CAPS capsule Take 50,000 Units by mouth every 7 (seven) days.  . [DISCONTINUED] ACCU-CHEK AVIVA PLUS test strip CHECK BLOOD GLUCOSE TWICE DAILY  . [DISCONTINUED] ACCU-CHEK SOFTCLIX LANCETS lancets USE AS INSTRUCTED FOR ONCE DAILY TESTING  . [DISCONTINUED] Insulin Pen Needle (B-D ULTRAFINE III SHORT PEN) 31G X 8 MM MISC 1 each by Does not apply route 4 (four) times daily.  . [DISCONTINUED] Insulin Pen Needle (RELION PEN NEEDLE 31G/8MM) 31G X 8 MM MISC USE DAILY AS DIRECTED  . [DISCONTINUED] metFORMIN (GLUCOPHAGE-XR) 500 MG 24 hr tablet Take 2 tablets (1,000 mg total) by mouth daily with breakfast.  . [DISCONTINUED] methylphenidate (DAYTRANA) 20 MG/9HR Place 1 patch onto the skin daily. wear patch  for 9 hours only each day   No facility-administered encounter medications on file as of 02/11/2018.    ALLERGIES: Allergies  Allergen Reactions  . Lisinopril    VACCINATION STATUS:  There is no immunization history on file for this patient.  Diabetes  She presents for her follow-up diabetic visit. She has type 2 diabetes mellitus. Onset time: She was diagnosed at approximate age of 58 years. Her disease course has been improving. There are no hypoglycemic associated symptoms. Pertinent negatives for hypoglycemia include no confusion, headaches, pallor or seizures. There are no diabetic associated symptoms. Pertinent negatives for diabetes include no chest pain, no fatigue, no polydipsia, no polyphagia and no polyuria. There are no hypoglycemic complications. Symptoms are improving.  There are no diabetic complications. Risk factors for coronary artery disease include dyslipidemia, hypertension and obesity. Current diabetic treatment includes insulin injections and oral agent (dual therapy). She is compliant with treatment most of the time. Her weight is fluctuating minimally. She is following a generally unhealthy diet. She has had a previous visit with a dietitian. She participates in exercise intermittently. There is no change in her home blood glucose trend. Her breakfast blood glucose range is generally 140-180 mg/dl. Her dinner blood glucose range is generally 180-200 mg/dl. Her overall blood glucose range is 140-180 mg/dl. An ACE inhibitor/angiotensin II receptor blocker is being taken. Eye exam is current.  Hyperlipidemia  This is a chronic problem. The current episode started more than 1 year ago. Exacerbating diseases include diabetes and obesity. Pertinent negatives include no chest pain, myalgias or shortness of breath. Current antihyperlipidemic treatment includes statins. Risk factors for coronary artery disease include diabetes mellitus, dyslipidemia, a sedentary lifestyle and post-menopausal.  Hypertension  This is a chronic problem. The current episode started more than 1 year ago. Pertinent negatives include no chest pain, headaches, palpitations or shortness of breath. Risk factors for coronary artery disease include diabetes mellitus, dyslipidemia, post-menopausal state, sedentary lifestyle and smoking/tobacco exposure. Past treatments include ACE inhibitors.     Review of Systems  Constitutional: Negative for fatigue and unexpected weight change.  HENT: Negative for trouble swallowing and voice change.   Eyes: Negative for visual disturbance.  Respiratory: Negative for cough, shortness of breath and wheezing.   Cardiovascular: Negative for chest pain, palpitations and leg swelling.  Gastrointestinal: Negative for diarrhea, nausea and vomiting.  Endocrine:  Negative for cold intolerance, heat intolerance, polydipsia, polyphagia and polyuria.  Musculoskeletal: Negative for arthralgias and myalgias.  Skin: Negative for color change, pallor, rash and wound.  Neurological: Negative for seizures and headaches.  Psychiatric/Behavioral: Negative for confusion and suicidal ideas.    Objective:    BP (!) 143/85   Pulse 89   Ht 5\' 5"  (1.651 m)   Wt 203 lb (92.1 kg)   BMI 33.78 kg/m   Wt Readings from Last 3 Encounters:  02/11/18 203 lb (92.1 kg)  01/26/18 202 lb (91.6 kg)  11/11/17 207 lb (93.9 kg)    Physical Exam  Constitutional: She is oriented to person, place, and time. She appears well-developed.  HENT:  Head: Normocephalic and atraumatic.  Eyes: EOM are normal.  Neck: Normal range of motion. Neck supple. No tracheal deviation present. No thyromegaly present.  Cardiovascular: Normal rate.  Pulmonary/Chest: Effort normal.  Abdominal: There is no abdominal tenderness. There is no guarding.  Musculoskeletal: Normal range of motion.        General: No edema.  Neurological: She is alert and oriented to person, place, and time. No  cranial nerve deficit. Coordination normal.  Skin: Skin is warm and dry. No rash noted. No erythema. No pallor.  Psychiatric: She has a normal mood and affect. Judgment normal.    Results for orders placed or performed in visit on 11/11/17  Hemoglobin A1c  Result Value Ref Range   Hgb A1c MFr Bld 8.8 (H) <5.7 % of total Hgb   Mean Plasma Glucose 206 (calc)   eAG (mmol/L) 11.4 (calc)  COMPLETE METABOLIC PANEL WITH GFR  Result Value Ref Range   Glucose, Bld 207 (H) 65 - 139 mg/dL   BUN 14 7 - 25 mg/dL   Creat 0.63 0.50 - 0.99 mg/dL   GFR, Est Non African American 95 > OR = 60 mL/min/1.27m2   GFR, Est African American 110 > OR = 60 mL/min/1.63m2   BUN/Creatinine Ratio NOT APPLICABLE 6 - 22 (calc)   Sodium 136 135 - 146 mmol/L   Potassium 4.4 3.5 - 5.3 mmol/L   Chloride 100 98 - 110 mmol/L   CO2 28 20 -  32 mmol/L   Calcium 9.5 8.6 - 10.4 mg/dL   Total Protein 7.0 6.1 - 8.1 g/dL   Albumin 4.5 3.6 - 5.1 g/dL   Globulin 2.5 1.9 - 3.7 g/dL (calc)   AG Ratio 1.8 1.0 - 2.5 (calc)   Total Bilirubin 0.4 0.2 - 1.2 mg/dL   Alkaline phosphatase (APISO) 68 33 - 130 U/L   AST 32 10 - 35 U/L   ALT 41 (H) 6 - 29 U/L   Lipid Panel     Component Value Date/Time   CHOL 144 09/04/2016 1036   TRIG 283 (H) 09/04/2016 1036   HDL 28 (L) 09/04/2016 1036   CHOLHDL 5.1 (H) 09/04/2016 1036   VLDL 57 (H) 09/04/2016 1036   LDLCALC 59 09/04/2016 1036     Assessment & Plan:   1. Uncontrolled type 2 diabetes mellitus with complication, with long-term current use of insulin ([place])  Patient came with slightly better A1c of 8.8%, still above target glycemic profile both fasting and postprandial.    Glucose logs and insulin administration records pertaining to this visit,  to be scanned into patient's records.  Recent labs reviewed. - Patient remains at a high risk for more acute and chronic complications of diabetes which include CAD, CVA, CKD, retinopathy, and neuropathy. These are all discussed in detail with the patient.  - I have re-counseled the patient on diet management and weight loss  by adopting a carbohydrate restricted / protein rich  Diet. - Patient is advised to stick to a routine mealtimes to eat 3 meals  a day and avoid unnecessary snacks ( to snack only to correct hypoglycemia).  -She still admits to dietary indiscretions including consumption of sweetened beverages.  -  Suggestion is made for her to avoid simple carbohydrates  from her diet including Cakes, Sweet Desserts / Pastries, Ice Cream, Soda (diet and regular), Sweet Tea, Candies, Chips, Cookies, Store Bought Juices, Alcohol in Excess of  1-2 drinks a day, Artificial Sweeteners, and "Sugar-free" Products. This will help patient to have stable blood glucose profile and potentially avoid unintended weight gain.   - I have approached  patient with the following individualized plan to manage diabetes and patient agrees.   -She is approached to maximize her lifestyle modification.  Given her A1c of 8.8%, she would not be given prandial insulin for today.   -She is advised to continue Soliqua 60/20 units daily at breakfast, along with her  metformin 1000 mg ER p.o. daily.   -Patient is encouraged to call clinic for blood glucose levels less than 70 or above 300 mg /dl. -She will be considered for MDI if A1c is increasing to above 9% during her next visit. - Patient specific target  for A1c; LDL, HDL, Triglycerides, and  Waist Circumference were discussed in detail.  2) BP/HTN: Her blood pressure is uncontrolled.    She is advised to continue her current blood pressure medications including losartan 25 mg p.o. daily.  She will be considered for higher dose of losartan during her next visit.   3) Lipids/HPL: She is advised to continue Lipitor 80 mg p.o. nightly.  Her recent lipid panel showed controlled LDL at 59.  She will be considered for lower dose of Lipitor after her next visit.   4)  Weight/Diet: CDE consult in progress, exercise, and carbohydrates information provided.  5) Chronic Care/Health Maintenance:  -Patient  on ACEI and Statin medications and encouraged to continue to follow up with Ophthalmology, Podiatrist at least yearly or according to recommendations, and advised to  stay away from smoking. I have recommended yearly flu vaccine and pneumonia vaccination at least every 5 years; moderate intensity exercise for up to 150 minutes weekly; and  sleep for at least 7 hours a day.  I advised patient to maintain close follow up with her PCP for primary care needs.  - Time spent with the patient: 25 min, of which >50% was spent in reviewing her blood glucose logs , discussing her hypo- and hyper-glycemic episodes, reviewing her current and  previous labs and insulin doses and developing a plan to avoid hypo- and  hyper-glycemia. Please refer to Patient Instructions for Blood Glucose Monitoring and Insulin/Medications Dosing Guide"  in media tab for additional information. Royetta Crochet [place] participated in the discussions, expressed understanding, and voiced agreement with the above plans.  All questions were answered to her satisfaction. she is encouraged to contact clinic should she have any questions or concerns prior to her return visit.   Follow up plan: Return in about 4 months (around 06/12/2018) for Meter, and Logs.  Glade Lloyd, MD Phone: [phone]  Fax: [phone]   -  This note was partially dictated with voice recognition software. Similar sounding words can be transcribed inadequately or may not  be corrected upon review.  02/11/2018, 1:31 PM

## 2018-02-20 ENCOUNTER — Other Ambulatory Visit: Payer: Self-pay

## 2018-02-27 ENCOUNTER — Ambulatory Visit: Payer: Medicare Other | Admitting: Orthopedic Surgery

## 2018-02-27 ENCOUNTER — Encounter: Payer: Self-pay | Admitting: Orthopedic Surgery

## 2018-02-27 VITALS — BP 145/91 | HR 92 | Ht 65.0 in | Wt 203.0 lb

## 2018-02-27 DIAGNOSIS — M1711 Unilateral primary osteoarthritis, right knee: Secondary | ICD-10-CM | POA: Diagnosis not present

## 2018-02-27 DIAGNOSIS — M23321 Other meniscus derangements, posterior horn of medial meniscus, right knee: Secondary | ICD-10-CM

## 2018-02-27 DIAGNOSIS — M23300 Other meniscus derangements, unspecified lateral meniscus, right knee: Secondary | ICD-10-CM | POA: Diagnosis not present

## 2018-02-27 NOTE — Progress Notes (Signed)
FOLLOW UP VISIT : MRI RESULTS   Chief Complaint  Patient presents with  . Knee Pain    right      HPI: The patient is here TO DISCUSS THE RESULTS OF MRI  63 year old female presents back after MRI.  She says her knee feels better but she still has to be careful with it and cannot twist on it too quickly  Previously noted  63 year old female presents for evaluation of right knee pain  Referral from [facility] in [place]  She says 5 weeks ago she was walking her right knee gave way and since that time she had increasing pain in her right knee primarily on the medial side of the joint with giving way symptoms moderately severe pain decreased range of motion and some swelling.  She was treated with ibuprofen Voltaren gel brace and tramadol muscle relaxer  She is also in physical therapy   ROS   BP (!) 145/91   Pulse 92   Ht 5\' 5"  (1.651 m)   Wt 203 lb (92.1 kg)   BMI 33.78 kg/m     Medical decision-making section   DATA  MRI REPORT:  IMPRESSION: 1. Radial tear of the posterior horn of the medial meniscus with peripheral meniscal extrusion. 2. Oblique tear of the anterior horn of the lateral meniscus extending to the superior articular surface. 3. Partial-thickness cartilage loss of the patellofemoral compartment. 4. Partial-thickness cartilage loss of the medial femorotibial compartment.     Electronically Signed   By: Kathreen Devoid   On: 02/10/2018 11:00    MY READING: MRI OF THE the medial meniscus is torn.  Lateral meniscus looks abnormal not quite convinced that the tear.  The patellofemoral joint cartilage loss is a very readily apparent as is the medial femoral compartment   Encounter Diagnoses  Name Primary?  . Derangement of posterior horn of medial meniscus of right knee Yes  . Meniscus, lateral, derangement, right   . Primary osteoarthritis of right knee     PLAN: We discussed treatment options.  I gave her the option  of having arthroscopy of the knee if she cannot put out with the knee the way it is or having continued home exercise program and activity modification which she has agreed to  We went over the MRI with a model.

## 2018-04-14 ENCOUNTER — Other Ambulatory Visit: Payer: Self-pay | Admitting: "Endocrinology

## 2018-05-10 ENCOUNTER — Other Ambulatory Visit: Payer: Self-pay | Admitting: "Endocrinology

## 2018-06-09 LAB — COMPLETE METABOLIC PANEL WITH GFR
AG Ratio: 1.8 (calc) (ref 1.0–2.5)
ALT: 26 U/L (ref 6–29)
AST: 27 U/L (ref 10–35)
Albumin: 4.6 g/dL (ref 3.6–5.1)
Alkaline phosphatase (APISO): 66 U/L (ref 37–153)
BUN: 11 mg/dL (ref 7–25)
CHLORIDE: 98 mmol/L (ref 98–110)
CO2: 29 mmol/L (ref 20–32)
Calcium: 9.8 mg/dL (ref 8.6–10.4)
Creat: 0.65 mg/dL (ref 0.50–0.99)
GFR, EST AFRICAN AMERICAN: 109 mL/min/{1.73_m2} (ref >=60)
GFR, EST NON AFRICAN AMERICAN: 94 mL/min/{1.73_m2} (ref >=60)
GLUCOSE: 309 mg/dL — AB (ref 65–139)
Globulin: 2.6 g/dL (calc) (ref 1.9–3.7)
Potassium: 5.2 mmol/L (ref 3.5–5.3)
Sodium: 134 mmol/L — ABNORMAL LOW (ref 135–146)
TOTAL PROTEIN: 7.2 g/dL (ref 6.1–8.1)
Total Bilirubin: 0.3 mg/dL (ref 0.2–1.2)

## 2018-06-09 LAB — HEMOGLOBIN A1C
HEMOGLOBIN A1C: 9.6 %{Hb} — AB (ref <5.7)
Mean Plasma Glucose: 229 (calc)
eAG (mmol/L): 12.7 (calc)

## 2018-06-15 ENCOUNTER — Encounter: Payer: Self-pay | Admitting: "Endocrinology

## 2018-06-15 ENCOUNTER — Other Ambulatory Visit: Payer: Self-pay

## 2018-06-15 ENCOUNTER — Ambulatory Visit (INDEPENDENT_AMBULATORY_CARE_PROVIDER_SITE_OTHER): Payer: Medicare Other | Admitting: "Endocrinology

## 2018-06-15 VITALS — BP 138/81 | HR 92 | Ht 65.0 in | Wt 203.0 lb

## 2018-06-15 DIAGNOSIS — E782 Mixed hyperlipidemia: Secondary | ICD-10-CM

## 2018-06-15 DIAGNOSIS — I10 Essential (primary) hypertension: Secondary | ICD-10-CM

## 2018-06-15 DIAGNOSIS — E1165 Type 2 diabetes mellitus with hyperglycemia: Secondary | ICD-10-CM

## 2018-06-15 DIAGNOSIS — E118 Type 2 diabetes mellitus with unspecified complications: Secondary | ICD-10-CM | POA: Diagnosis not present

## 2018-06-15 DIAGNOSIS — IMO0002 Reserved for concepts with insufficient information to code with codable children: Secondary | ICD-10-CM

## 2018-06-15 DIAGNOSIS — Z794 Long term (current) use of insulin: Secondary | ICD-10-CM

## 2018-06-15 MED ORDER — INSULIN LISPRO (1 UNIT DIAL) 100 UNIT/ML (KWIKPEN): 4.0000 [IU] | PEN_INJECTOR | Freq: Three times a day (TID) | SUBCUTANEOUS | 2 refills | Status: DC

## 2018-06-15 MED ORDER — INSULIN GLARGINE (2 UNIT DIAL) 300 UNIT/ML ~~LOC~~ SOPN: 60.0000 [IU] | PEN_INJECTOR | Freq: Every day | SUBCUTANEOUS | 2 refills | Status: DC

## 2018-06-15 NOTE — Patient Instructions (Signed)

## 2018-06-15 NOTE — Progress Notes (Signed)
Endocrinology follow-up note    Subjective:    Patient ID: Melanie Schultz, female    DOB: [birth year],    Past Medical History:  Diagnosis Date  . Diabetes mellitus without complication ([place])   . Multiple sclerosis ([place])    Past Surgical History:  Procedure Laterality Date  . BREAST BIOPSY Left   . BREAST EXCISIONAL BIOPSY Left    benign  . CESAREAN SECTION    . NASAL SEPTUM SURGERY     Social History   Socioeconomic History  . Marital status: Married    Spouse name: Not on file  . Number of children: Not on file  . Years of education: Not on file  . Highest education level: Not on file  Occupational History  . Not on file  Social Needs  . Financial resource strain: Not on file  . Food insecurity:    Worry: Not on file    Inability: Not on file  . Transportation needs:    Medical: Not on file    Non-medical: Not on file  Tobacco Use  . Smoking status: Former Research scientist (life sciences)  . Smokeless tobacco: Never Used  Substance and Sexual Activity  . Alcohol use: No    Alcohol/week: 0.0 standard drinks  . Drug use: No  . Sexual activity: Not on file  Lifestyle  . Physical activity:    Days per week: Not on file    Minutes per session: Not on file  . Stress: Not on file  Relationships  . Social connections:    Talks on phone: Not on file    Gets together: Not on file    Attends religious service: Not on file    Active member of club or organization: Not on file    Attends meetings of clubs or organizations: Not on file    Relationship status: Not on file  Other Topics Concern  . Not on file  Social History Narrative  . Not on file   Outpatient Encounter Medications as of 06/15/2018  Medication Sig  . Cholecalciferol (VITAMIN D3) 125 MCG (5000 UT) CAPS Take by mouth daily.  Marland Kitchen atenolol (TENORMIN) 50 MG tablet Take 50 mg by mouth 2 (two) times daily.   . Insulin Glargine, 2 Unit Dial, (TOUJEO MAX SOLOSTAR) 300 UNIT/ML SOPN Inject 60 Units into the skin at bedtime. After  you finish with Soliqua  . insulin lispro (HUMALOG KWIKPEN) 100 UNIT/ML KwikPen Inject 0.04 mLs (4 Units total) into the skin 3 (three) times daily.  Marland Kitchen losartan (COZAAR) 25 MG tablet daily.  . metFORMIN (GLUCOPHAGE-XR) 500 MG 24 hr tablet Take 2 tablets (1,000 mg total) by mouth daily with breakfast.  . methylphenidate (RITALIN) 10 MG tablet Take 40 mg by mouth daily.  Marland Kitchen omeprazole (PRILOSEC) 20 MG capsule Take 20 mg by mouth daily.  . rosuvastatin (CRESTOR) 20 MG tablet Take 20 mg by mouth daily.  . Teriflunomide 14 MG TABS Take by mouth.  . [DISCONTINUED] atorvastatin (LIPITOR) 40 MG tablet Take 1 tablet (40 mg total) by mouth daily.  . [DISCONTINUED] SOLIQUA 100-33 UNT-MCG/ML SOPN INJECT 50 UNITS SUBCUTANEOUSLY ONCE DAILY WITH BREAKFAST  . [DISCONTINUED] Vitamin D, Ergocalciferol, (DRISDOL) 50000 units CAPS capsule Take 50,000 Units by mouth every 7 (seven) days.   No facility-administered encounter medications on file as of 06/15/2018.    ALLERGIES: Allergies  Allergen Reactions  . Lisinopril    VACCINATION STATUS:  There is no immunization history on file for this patient.  Diabetes  She presents  for her follow-up diabetic visit. She has type 2 diabetes mellitus. Onset time: She was diagnosed at approximate age of 45 years. Her disease course has been worsening. There are no hypoglycemic associated symptoms. Pertinent negatives for hypoglycemia include no confusion, headaches, pallor or seizures. Associated symptoms include polydipsia and polyuria. Pertinent negatives for diabetes include no chest pain, no fatigue and no polyphagia. There are no hypoglycemic complications. Symptoms are worsening. There are no diabetic complications. Risk factors for coronary artery disease include dyslipidemia, hypertension and obesity. Current diabetic treatment includes insulin injections and oral agent (dual therapy). She is compliant with treatment most of the time. Her weight is fluctuating  minimally. She is following a generally unhealthy diet. She has had a previous visit with a dietitian. She participates in exercise intermittently. There is no change in her home blood glucose trend. Her breakfast blood glucose range is generally 180-200 mg/dl. Her bedtime blood glucose range is generally >200 mg/dl. Her overall blood glucose range is >200 mg/dl. An ACE inhibitor/angiotensin II receptor blocker is being taken. Eye exam is current.  Hyperlipidemia  This is a chronic problem. The current episode started more than 1 year ago. Exacerbating diseases include diabetes and obesity. Pertinent negatives include no chest pain, myalgias or shortness of breath. Current antihyperlipidemic treatment includes statins. Risk factors for coronary artery disease include diabetes mellitus, dyslipidemia, a sedentary lifestyle and post-menopausal.  Hypertension  This is a chronic problem. The current episode started more than 1 year ago. Pertinent negatives include no chest pain, headaches, palpitations or shortness of breath. Risk factors for coronary artery disease include diabetes mellitus, dyslipidemia, post-menopausal state, sedentary lifestyle and smoking/tobacco exposure. Past treatments include ACE inhibitors.     Review of Systems  Constitutional: Negative for fatigue and unexpected weight change.  HENT: Negative for trouble swallowing and voice change.   Eyes: Negative for visual disturbance.  Respiratory: Negative for cough, shortness of breath and wheezing.   Cardiovascular: Negative for chest pain, palpitations and leg swelling.  Gastrointestinal: Negative for diarrhea, nausea and vomiting.  Endocrine: Positive for polydipsia and polyuria. Negative for cold intolerance, heat intolerance and polyphagia.  Musculoskeletal: Negative for arthralgias and myalgias.  Skin: Negative for color change, pallor, rash and wound.  Neurological: Negative for seizures and headaches.  Psychiatric/Behavioral:  Negative for confusion and suicidal ideas.    Objective:    BP 138/81   Pulse 92   Ht 5\' 5"  (1.651 m)   Wt 203 lb (92.1 kg)   BMI 33.78 kg/m   Wt Readings from Last 3 Encounters:  06/15/18 203 lb (92.1 kg)  02/27/18 203 lb (92.1 kg)  02/11/18 203 lb (92.1 kg)    Physical Exam  Constitutional: She is oriented to person, place, and time. She appears well-developed.  HENT:  Head: Normocephalic and atraumatic.  Eyes: EOM are normal.  Neck: Normal range of motion. Neck supple. No tracheal deviation present. No thyromegaly present.  Cardiovascular: Normal rate.  Pulmonary/Chest: Effort normal.  Abdominal: There is no abdominal tenderness. There is no guarding.  Musculoskeletal: Normal range of motion.        General: No edema.  Neurological: She is alert and oriented to person, place, and time. No cranial nerve deficit. Coordination normal.  Skin: Skin is warm and dry. No rash noted. No erythema. No pallor.  Psychiatric: She has a normal mood and affect. Judgment normal.    Results for orders placed or performed in visit on 02/11/18  Hemoglobin A1c  Result Value Ref Range  Hgb A1c MFr Bld 9.6 (H) <5.7 % of total Hgb   Mean Plasma Glucose 229 (calc)   eAG (mmol/L) 12.7 (calc)  COMPLETE METABOLIC PANEL WITH GFR  Result Value Ref Range   Glucose, Bld 309 (H) 65 - 139 mg/dL   BUN 11 7 - 25 mg/dL   Creat 0.65 0.50 - 0.99 mg/dL   GFR, Est Non African American 94 > OR = 60 mL/min/1.82m2   GFR, Est African American 109 > OR = 60 mL/min/1.51m2   BUN/Creatinine Ratio NOT APPLICABLE 6 - 22 (calc)   Sodium 134 (L) 135 - 146 mmol/L   Potassium 5.2 3.5 - 5.3 mmol/L   Chloride 98 98 - 110 mmol/L   CO2 29 20 - 32 mmol/L   Calcium 9.8 8.6 - 10.4 mg/dL   Total Protein 7.2 6.1 - 8.1 g/dL   Albumin 4.6 3.6 - 5.1 g/dL   Globulin 2.6 1.9 - 3.7 g/dL (calc)   AG Ratio 1.8 1.0 - 2.5 (calc)   Total Bilirubin 0.3 0.2 - 1.2 mg/dL   Alkaline phosphatase (APISO) 66 37 - 153 U/L   AST 27 10 - 35  U/L   ALT 26 6 - 29 U/L   Lipid Panel     Component Value Date/Time   CHOL 144 09/04/2016 1036   TRIG 283 (H) 09/04/2016 1036   HDL 28 (L) 09/04/2016 1036   CHOLHDL 5.1 (H) 09/04/2016 1036   VLDL 57 (H) 09/04/2016 1036   LDLCALC 59 09/04/2016 1036     Assessment & Plan:   1. Uncontrolled type 2 diabetes mellitus with complication, with long-term current use of insulin ([place])  She returns with loss of control of glycemic profile and A1c of 9.6% increasing from 8.8%.  She has significant hyperglycemia both postprandial and fasting.     Glucose logs and insulin administration records pertaining to this visit,  to be scanned into patient's records.  Recent labs reviewed. - Patient remains at a high risk for more acute and chronic complications of diabetes which include CAD, CVA, CKD, retinopathy, and neuropathy. These are all discussed in detail with the patient.  - I have re-counseled the patient on diet management and weight loss  by adopting a carbohydrate restricted / protein rich  Diet. - Patient is advised to stick to a routine mealtimes to eat 3 meals  a day and avoid unnecessary snacks ( to snack only to correct hypoglycemia).  - Patient admits there is a room for improvement in her diet and drink choices. -  Suggestion is made for her to avoid simple carbohydrates  from her diet including Cakes, Sweet Desserts / Pastries, Ice Cream, Soda (diet and regular), Sweet Tea, Candies, Chips, Cookies, Store Bought Juices, Alcohol in Excess of  1-2 drinks a day, Artificial Sweeteners, and "Sugar-free" Products. This will help patient to have stable blood glucose profile and potentially avoid unintended weight gain.  - I have approached patient with the following individualized plan to manage diabetes and patient agrees.   -Based on her presentation with significant loss of control of diabetes she is approached for intensive treatment with basal/bolus insulin and patient agrees.   -She  will be allowed to finish her current supply of Soliqua 60 units every morning, will be switched to Toujeo 60 units nightly afterwards.   -I discussed and initiated Humalog 10-16 units 3 times daily AC for pre-meal blood glucose readings above 90 mg..  She is urged to continue to monitor blood glucose strictly  4 times a day- before meals and at bedtime.    -Patient is encouraged to call clinic for blood glucose levels less than 70 or above 300 mg /dl.    -She is advised to continue metformin 1000 mg ER p.o. daily.   - Patient specific target  for A1c; LDL, HDL, Triglycerides, and  Waist Circumference were discussed in detail.  2) BP/HTN: Her blood pressure is controlled to target.    She is advised to continue her current blood pressure medications including losartan 25 mg p.o. daily.  She will be considered for higher dose of losartan during her next visit.   3) Lipids/HPL: She is advised to continue Lipitor 80 mg p.o. nightly.  Her recent lipid panel showed controlled LDL at 59.  She will be considered for lower dose of Lipitor after her next visit.   4)  Weight/Diet: CDE consult in progress, exercise, and carbohydrates information provided.  5) Chronic Care/Health Maintenance:  -Patient  on ACEI and Statin medications and encouraged to continue to follow up with Ophthalmology, Podiatrist at least yearly or according to recommendations, and advised to  stay away from smoking. I have recommended yearly flu vaccine and pneumonia vaccination at least every 5 years; moderate intensity exercise for up to 150 minutes weekly; and  sleep for at least 7 hours a day.  I advised patient to maintain close follow up with her PCP for primary care needs.  - Time spent with the patient: 25 min, of which >50% was spent in reviewing her blood glucose logs , discussing her hypoglycemia and hyperglycemia episodes, reviewing her current and  previous labs / studies and medications  doses and developing a plan to  avoid hypoglycemia and hyperglycemia. Please refer to Patient Instructions for Blood Glucose Monitoring and Insulin/Medications Dosing Guide"  in media tab for additional information. Please  also refer to " Patient Self Inventory" in the Media  tab for reviewed elements of pertinent patient history.  Royetta Crochet Elzey participated in the discussions, expressed understanding, and voiced agreement with the above plans.  All questions were answered to her satisfaction. she is encouraged to contact clinic should she have any questions or concerns prior to her return visit.   Follow up plan: Return in about 10 days (around 06/25/2018) for Follow up with Meter and Logs Only - no Labs.  Glade Lloyd, MD Phone: [phone]  Fax: [phone]   -  This note was partially dictated with voice recognition software. Similar sounding words can be transcribed inadequately or may not  be corrected upon review.  06/15/2018, 1:24 PM

## 2018-06-22 ENCOUNTER — Other Ambulatory Visit: Payer: Self-pay

## 2018-06-30 ENCOUNTER — Other Ambulatory Visit: Payer: Self-pay | Admitting: "Endocrinology

## 2018-06-30 ENCOUNTER — Ambulatory Visit: Payer: Medicare Other | Admitting: "Endocrinology

## 2018-07-01 ENCOUNTER — Other Ambulatory Visit: Payer: Self-pay

## 2018-07-01 MED ORDER — BD PEN NEEDLE SHORT U/F 31G X 8 MM MISC: 1.0000 | Freq: Four times a day (QID) | 5 refills | Status: DC

## 2018-07-08 ENCOUNTER — Encounter: Payer: Self-pay | Admitting: "Endocrinology

## 2018-07-08 ENCOUNTER — Other Ambulatory Visit: Payer: Self-pay

## 2018-07-08 ENCOUNTER — Ambulatory Visit (INDEPENDENT_AMBULATORY_CARE_PROVIDER_SITE_OTHER): Payer: Medicare Other | Admitting: "Endocrinology

## 2018-07-08 DIAGNOSIS — IMO0002 Reserved for concepts with insufficient information to code with codable children: Secondary | ICD-10-CM

## 2018-07-08 DIAGNOSIS — E1165 Type 2 diabetes mellitus with hyperglycemia: Secondary | ICD-10-CM

## 2018-07-08 DIAGNOSIS — E782 Mixed hyperlipidemia: Secondary | ICD-10-CM | POA: Diagnosis not present

## 2018-07-08 DIAGNOSIS — Z794 Long term (current) use of insulin: Secondary | ICD-10-CM

## 2018-07-08 DIAGNOSIS — I10 Essential (primary) hypertension: Secondary | ICD-10-CM | POA: Diagnosis not present

## 2018-07-08 DIAGNOSIS — E118 Type 2 diabetes mellitus with unspecified complications: Secondary | ICD-10-CM

## 2018-07-08 MED ORDER — INSULIN LISPRO (1 UNIT DIAL) 100 UNIT/ML (KWIKPEN): 14.0000 [IU] | PEN_INJECTOR | Freq: Three times a day (TID) | SUBCUTANEOUS | 2 refills | Status: DC

## 2018-07-08 NOTE — Progress Notes (Signed)
07/08/2018                                                    Endocrinology Telehealth Visit Follow up Note -During COVID -19 Pandemic  This visit type was conducted due to national recommendations for restrictions regarding the COVID-19 Pandemic  in an effort to limit this patient's exposure and mitigate transmission of the corona virus.  Due to her co-morbid illnesses, Melanie Schultz is at  moderate to high risk for complications without adequate follow up.  This format is felt to be most appropriate for her at this time.  I connected with this patient on 07/08/2018   by telephone and verified that I am speaking with the correct person using two identifiers. Melanie Schultz, 04-05-1955. she has verbally consented to this visit. All issues noted in this document were discussed and addressed. The format was not optimal for physical exam.     Subjective:    Patient ID: Melanie Schultz, female    DOB: 1956/01/18,    Past Medical History:  Diagnosis Date  . Diabetes mellitus without complication ([place])   . Multiple sclerosis ([place])    Past Surgical History:  Procedure Laterality Date  . BREAST BIOPSY Left   . BREAST EXCISIONAL BIOPSY Left    benign  . CESAREAN SECTION    . NASAL SEPTUM SURGERY     Social History   Socioeconomic History  . Marital status: Married    Spouse name: Not on file  . Number of children: Not on file  . Years of education: Not on file  . Highest education level: Not on file  Occupational History  . Not on file  Social Needs  . Financial resource strain: Not on file  . Food insecurity:    Worry: Not on file    Inability: Not on file  . Transportation needs:    Medical: Not on file    Non-medical: Not on file  Tobacco Use  . Smoking status: Former Research scientist (life sciences)  . Smokeless tobacco: Never Used  Substance and Sexual Activity  . Alcohol use: No    Alcohol/week: 0.0 standard drinks  . Drug use: No  . Sexual activity: Not on file  Lifestyle  . Physical activity:     Days per week: Not on file    Minutes per session: Not on file  . Stress: Not on file  Relationships  . Social connections:    Talks on phone: Not on file    Gets together: Not on file    Attends religious service: Not on file    Active member of club or organization: Not on file    Attends meetings of clubs or organizations: Not on file    Relationship status: Not on file  Other Topics Concern  . Not on file  Social History Narrative  . Not on file   Outpatient Encounter Medications as of 07/08/2018  Medication Sig  . atenolol (TENORMIN) 50 MG tablet Take 50 mg by mouth 2 (two) times daily.   . B-D ULTRAFINE III SHORT PEN 31G X 8 MM MISC 1 each by Other route 4 (four) times daily.  . Cholecalciferol (VITAMIN D3) 125 MCG (5000 UT) CAPS Take by mouth daily.  . Insulin Glargine, 2 Unit Dial, (TOUJEO MAX SOLOSTAR) 300 UNIT/ML SOPN Inject 60 Units  into the skin at bedtime. After you finish with Soliqua  . insulin lispro (HUMALOG KWIKPEN) 100 UNIT/ML KwikPen Inject 0.14-0.2 mLs (14-20 Units total) into the skin 3 (three) times daily before meals.  . Lancets Thin MISC by Does not apply route 4 (four) times daily.  Marland Kitchen losartan (COZAAR) 25 MG tablet daily.  . metFORMIN (GLUCOPHAGE-XR) 500 MG 24 hr tablet TAKE 2 TABLETS BY MOUTH ONCE DAILY WITH BREAKFAST  . methylphenidate (RITALIN) 10 MG tablet Take 40 mg by mouth daily.  Marland Kitchen omeprazole (PRILOSEC) 20 MG capsule Take 20 mg by mouth daily.  . rosuvastatin (CRESTOR) 20 MG tablet Take 20 mg by mouth daily.  . Teriflunomide 14 MG TABS Take by mouth.  . [DISCONTINUED] insulin lispro (HUMALOG KWIKPEN) 100 UNIT/ML KwikPen Inject 0.04 mLs (4 Units total) into the skin 3 (three) times daily.   No facility-administered encounter medications on file as of 07/08/2018.    ALLERGIES: Allergies  Allergen Reactions  . Lisinopril    VACCINATION STATUS:  There is no immunization history on file for this patient.  Diabetes  She presents for her  follow-up diabetic visit. She has type 2 diabetes mellitus. Onset time: She was diagnosed at approximate age of 61 years. Her disease course has been improving. There are no hypoglycemic associated symptoms. Pertinent negatives for hypoglycemia include no confusion, headaches, pallor or seizures. Pertinent negatives for diabetes include no chest pain, no fatigue, no polydipsia, no polyphagia and no polyuria. There are no hypoglycemic complications. Symptoms are improving. There are no diabetic complications. Risk factors for coronary artery disease include dyslipidemia, hypertension and obesity. Current diabetic treatment includes insulin injections and oral agent (dual therapy). She is compliant with treatment most of the time. She is following a generally unhealthy diet. She has had a previous visit with a dietitian. She participates in exercise intermittently. There is no change in her home blood glucose trend. Her breakfast blood glucose range is generally 130-140 mg/dl. Her lunch blood glucose range is generally 140-180 mg/dl. Her dinner blood glucose range is generally 140-180 mg/dl. Her bedtime blood glucose range is generally 140-180 mg/dl. Her overall blood glucose range is 140-180 mg/dl. An ACE inhibitor/angiotensin II receptor blocker is being taken. Eye exam is current.  Hyperlipidemia  This is a chronic problem. The current episode started more than 1 year ago. Exacerbating diseases include diabetes and obesity. Pertinent negatives include no chest pain, myalgias or shortness of breath. Current antihyperlipidemic treatment includes statins. Risk factors for coronary artery disease include diabetes mellitus, dyslipidemia, a sedentary lifestyle and post-menopausal.  Hypertension  This is a chronic problem. The current episode started more than 1 year ago. Pertinent negatives include no chest pain, headaches, palpitations or shortness of breath. Risk factors for coronary artery disease include diabetes  mellitus, dyslipidemia, post-menopausal state, sedentary lifestyle and smoking/tobacco exposure. Past treatments include ACE inhibitors.    Review of Systems  Constitutional: Negative for fatigue and unexpected weight change.  HENT: Negative for trouble swallowing and voice change.   Eyes: Negative for visual disturbance.  Respiratory: Negative for cough, shortness of breath and wheezing.   Cardiovascular: Negative for chest pain, palpitations and leg swelling.  Gastrointestinal: Negative for diarrhea, nausea and vomiting.  Endocrine: Negative for cold intolerance, heat intolerance, polydipsia, polyphagia and polyuria.  Musculoskeletal: Negative for arthralgias and myalgias.  Skin: Negative for color change, pallor, rash and wound.  Neurological: Negative for seizures and headaches.  Psychiatric/Behavioral: Negative for confusion and suicidal ideas.    Objective:  There were no vitals taken for this visit.  Wt Readings from Last 3 Encounters:  06/15/18 203 lb (92.1 kg)  02/27/18 203 lb (92.1 kg)  02/11/18 203 lb (92.1 kg)     Results for orders placed or performed in visit on 02/11/18  Hemoglobin A1c  Result Value Ref Range   Hgb A1c MFr Bld 9.6 (H) <5.7 % of total Hgb   Mean Plasma Glucose 229 (calc)   eAG (mmol/L) 12.7 (calc)  COMPLETE METABOLIC PANEL WITH GFR  Result Value Ref Range   Glucose, Bld 309 (H) 65 - 139 mg/dL   BUN 11 7 - 25 mg/dL   Creat 0.65 0.50 - 0.99 mg/dL   GFR, Est Non African American 94 > OR = 60 mL/min/1.86m2   GFR, Est African American 109 > OR = 60 mL/min/1.83m2   BUN/Creatinine Ratio NOT APPLICABLE 6 - 22 (calc)   Sodium 134 (L) 135 - 146 mmol/L   Potassium 5.2 3.5 - 5.3 mmol/L   Chloride 98 98 - 110 mmol/L   CO2 29 20 - 32 mmol/L   Calcium 9.8 8.6 - 10.4 mg/dL   Total Protein 7.2 6.1 - 8.1 g/dL   Albumin 4.6 3.6 - 5.1 g/dL   Globulin 2.6 1.9 - 3.7 g/dL (calc)   AG Ratio 1.8 1.0 - 2.5 (calc)   Total Bilirubin 0.3 0.2 - 1.2 mg/dL    Alkaline phosphatase (APISO) 66 37 - 153 U/L   AST 27 10 - 35 U/L   ALT 26 6 - 29 U/L   Lipid Panel     Component Value Date/Time   CHOL 144 09/04/2016 1036   TRIG 283 (H) 09/04/2016 1036   HDL 28 (L) 09/04/2016 1036   CHOLHDL 5.1 (H) 09/04/2016 1036   VLDL 57 (H) 09/04/2016 1036   LDLCALC 59 09/04/2016 1036     Assessment & Plan:   1. Uncontrolled type 2 diabetes mellitus with complication, with long-term current use of insulin ([place])  She recently seen with higher A1c of 9.6% and was initiated on basal/bolus insulin for better control of glycemia.     Recent labs reviewed. - Patient remains at a high risk for more acute and chronic complications of diabetes which include CAD, CVA, CKD, retinopathy, and neuropathy. These are all discussed in detail with the patient.  - I have re-counseled the patient on diet management and weight loss  by adopting a carbohydrate restricted / protein rich  Diet. - Patient is advised to stick to a routine mealtimes to eat 3 meals  a day and avoid unnecessary snacks ( to snack only to correct hypoglycemia).  - Patient admits there is a room for improvement in her diet and drink choices. -  Suggestion is made for her to avoid simple carbohydrates  from her diet including Cakes, Sweet Desserts / Pastries, Ice Cream, Soda (diet and regular), Sweet Tea, Candies, Chips, Cookies, Store Bought Juices, Alcohol in Excess of  1-2 drinks a day, Artificial Sweeteners, and "Sugar-free" Products. This will help patient to have stable blood glucose profile and potentially avoid unintended weight gain.   - I have approached patient with the following individualized plan to manage diabetes and patient agrees.   -Based on her presentation with significant improvement in her glycemic profile, she will continue to require intensive treatment with basal/bolus insulin.  -She is advised to continue Toujeo 60 units nightly afterwards.   -She is advised to increase her  Humalog to 14-20  units 3 times  daily AC for pre-meal blood glucose readings above 90 mg. She is urged to continue to monitor blood glucose strictly 4 times a day- before meals and at bedtime.    -Patient is encouraged to call clinic for blood glucose levels less than 70 or above 300 mg /dl.    -She is advised to continue metformin 1000 mg ER p.o. daily.   - Patient specific target  for A1c; LDL, HDL, Triglycerides, and  Waist Circumference were discussed in detail.  2) BP/HTN: she is advised to home monitor blood pressure and report if > 140/90 on 2 separate readings.  She is advised to continue her current blood pressure medications including losartan 25 mg p.o. daily.  She will be considered for higher dose of losartan during her next visit.   3) Lipids/HPL: She is advised to continue Lipitor 80 mg p.o. nightly.  Her recent lipid panel showed controlled LDL at 59.  She will be considered for lower dose of Lipitor after her next visit.   4)  Weight/Diet: CDE consult in progress, exercise, and carbohydrates information provided.  5) Chronic Care/Health Maintenance:  -Patient  on ACEI and Statin medications and encouraged to continue to follow up with Ophthalmology, Podiatrist at least yearly or according to recommendations, and advised to  stay away from smoking. I have recommended yearly flu vaccine and pneumonia vaccination at least every 5 years; moderate intensity exercise for up to 150 minutes weekly; and  sleep for at least 7 hours a day.  I advised patient to maintain close follow up with her PCP for primary care needs.  - Patient Care Time Today:  25 min, of which >50% was spent in reviewing her  current and  previous labs/studies, her blood glucose readings, previous treatments, and medications doses and developing a plan for long-term care based on the latest recommendations for standards of care.  Royetta Crochet Tates participated in the discussions, expressed understanding, and voiced  agreement with the above plans.  All questions were answered to her satisfaction. she is encouraged to contact clinic should she have any questions or concerns prior to her return visit.   Follow up plan: Return in about 3 months (around 10/08/2018) for Follow up with Pre-visit Labs, Meter, and Logs.  Glade Lloyd, MD Phone: [phone]  Fax: [phone]   -  This note was partially dictated with voice recognition software. Similar sounding words can be transcribed inadequately or may not  be corrected upon review.  07/08/2018, 5:24 PM

## 2018-07-19 ENCOUNTER — Other Ambulatory Visit: Payer: Self-pay | Admitting: "Endocrinology

## 2018-07-21 ENCOUNTER — Telehealth: Payer: Self-pay | Admitting: "Endocrinology

## 2018-07-21 ENCOUNTER — Telehealth: Payer: Self-pay

## 2018-07-21 DIAGNOSIS — E1165 Type 2 diabetes mellitus with hyperglycemia: Secondary | ICD-10-CM

## 2018-07-21 DIAGNOSIS — IMO0002 Reserved for concepts with insufficient information to code with codable children: Secondary | ICD-10-CM

## 2018-07-21 MED ORDER — ACCU-CHEK SOFTCLIX LANCETS MISC: 100.0000 | Freq: Four times a day (QID) | 3 refills | Status: DC

## 2018-07-21 NOTE — Telephone Encounter (Signed)
Patient said she needs her directions changed on her lancets. Insurance will only pay if it says 4-5 times daily, not once daily. [facility] [place].

## 2018-07-21 NOTE — Telephone Encounter (Signed)
Done

## 2018-07-21 NOTE — Telephone Encounter (Signed)
Melanie Schultz, CMA  

## 2018-07-23 ENCOUNTER — Telehealth: Payer: Self-pay | Admitting: "Endocrinology

## 2018-07-23 NOTE — Telephone Encounter (Signed)
Patient left a VM for the nurse to call her back

## 2018-07-28 NOTE — Telephone Encounter (Signed)
Tried to call patient left voicemail for a returned call

## 2018-08-06 ENCOUNTER — Telehealth: Payer: Self-pay | Admitting: *Deleted

## 2018-08-06 DIAGNOSIS — Z20822 Contact with and (suspected) exposure to covid-19: Secondary | ICD-10-CM

## 2018-08-06 NOTE — Telephone Encounter (Signed)
Pt referred for COVID-19 testing by Abigail Butts with [facility] Dept.Left message for pt to return call to schedule.

## 2018-08-13 ENCOUNTER — Other Ambulatory Visit: Payer: Medicare Other

## 2018-08-13 DIAGNOSIS — Z20822 Contact with and (suspected) exposure to covid-19: Secondary | ICD-10-CM

## 2018-08-13 NOTE — Addendum Note (Signed)
Addended by: Valli Glance F on: 08/13/2018 11:38 AM   Modules accepted: Orders

## 2018-08-13 NOTE — Telephone Encounter (Signed)
Patient returned call for COVID testing- patient has been scheduled and test ordered.

## 2018-08-17 ENCOUNTER — Other Ambulatory Visit (HOSPITAL_COMMUNITY): Payer: Self-pay | Admitting: Family Medicine

## 2018-08-17 ENCOUNTER — Other Ambulatory Visit: Payer: Self-pay | Admitting: Family Medicine

## 2018-08-17 DIAGNOSIS — R7401 Elevation of levels of liver transaminase levels: Secondary | ICD-10-CM

## 2018-08-17 LAB — NOVEL CORONAVIRUS, NAA: SARS-CoV-2, NAA: NOT DETECTED

## 2018-08-20 ENCOUNTER — Ambulatory Visit (HOSPITAL_COMMUNITY)
Admission: RE | Admit: 2018-08-20 | Discharge: 2018-08-20 | Disposition: A | Discharge status: Home / Self Care | Payer: Medicare Other | Source: Ambulatory Visit | Attending: Family Medicine | Admitting: Family Medicine

## 2018-08-20 ENCOUNTER — Other Ambulatory Visit: Payer: Self-pay

## 2018-08-20 DIAGNOSIS — R7401 Elevation of levels of liver transaminase levels: Secondary | ICD-10-CM

## 2018-08-20 DIAGNOSIS — R740 Nonspecific elevation of levels of transaminase and lactic acid dehydrogenase [LDH]: Secondary | ICD-10-CM | POA: Diagnosis not present

## 2018-08-27 ENCOUNTER — Telehealth: Payer: Self-pay

## 2018-08-27 DIAGNOSIS — IMO0002 Reserved for concepts with insufficient information to code with codable children: Secondary | ICD-10-CM

## 2018-08-27 DIAGNOSIS — E1165 Type 2 diabetes mellitus with hyperglycemia: Secondary | ICD-10-CM

## 2018-08-27 MED ORDER — INSULIN LISPRO (1 UNIT DIAL) 100 UNIT/ML (KWIKPEN): 14.0000 [IU] | PEN_INJECTOR | Freq: Three times a day (TID) | SUBCUTANEOUS | 2 refills | Status: DC

## 2018-08-27 NOTE — Telephone Encounter (Signed)
LeighAnn Ritvik Mczeal, CMA  

## 2018-08-28 DIAGNOSIS — R7989 Other specified abnormal findings of blood chemistry: Secondary | ICD-10-CM | POA: Insufficient documentation

## 2018-09-07 ENCOUNTER — Telehealth: Payer: Self-pay | Admitting: "Endocrinology

## 2018-09-14 MED ORDER — ACCU-CHEK AVIVA PLUS VI STRP: ORAL_STRIP | 3 refills | Status: DC

## 2018-09-14 NOTE — Addendum Note (Signed)
Addended by: Lavell Luster A on: 09/14/2018 08:49 AM   Modules accepted: Orders

## 2018-09-14 NOTE — Telephone Encounter (Signed)
Patient left VM that her medications needed to go to [place], she said they went to [place]

## 2018-09-14 NOTE — Addendum Note (Signed)
Addended by: Lavell Luster A on: 09/14/2018 04:20 PM   Modules accepted: Orders

## 2018-09-14 NOTE — Telephone Encounter (Signed)
Patient said she needs this RX to say she tests 4x daily because she is running out quicker. She said she is not testing 2x daily

## 2018-09-17 ENCOUNTER — Telehealth: Payer: Self-pay | Admitting: "Endocrinology

## 2018-09-17 DIAGNOSIS — E1165 Type 2 diabetes mellitus with hyperglycemia: Secondary | ICD-10-CM

## 2018-09-17 DIAGNOSIS — IMO0002 Reserved for concepts with insufficient information to code with codable children: Secondary | ICD-10-CM

## 2018-09-17 MED ORDER — ACCU-CHEK SOFTCLIX LANCETS MISC: 100.0000 | Freq: Four times a day (QID) | 3 refills | Status: AC

## 2018-09-17 NOTE — Telephone Encounter (Signed)
Patient called and said she needs the Rx for her Lancets to be sent again to [place] [facility] stating she test four times daily/ they advised her they dont have it

## 2018-09-17 NOTE — Telephone Encounter (Signed)
Rx sent 

## 2018-09-18 ENCOUNTER — Other Ambulatory Visit: Payer: Self-pay

## 2018-09-18 DIAGNOSIS — E1165 Type 2 diabetes mellitus with hyperglycemia: Secondary | ICD-10-CM

## 2018-09-18 DIAGNOSIS — IMO0002 Reserved for concepts with insufficient information to code with codable children: Secondary | ICD-10-CM

## 2018-09-18 MED ORDER — ATENOLOL 50 MG PO TABS: 50.0000 mg | ORAL_TABLET | Freq: Two times a day (BID) | ORAL | 0 refills | Status: AC

## 2018-09-18 MED ORDER — TOUJEO MAX SOLOSTAR 300 UNIT/ML ~~LOC~~ SOPN: 60.0000 [IU] | PEN_INJECTOR | Freq: Every day | SUBCUTANEOUS | 0 refills | Status: DC

## 2018-09-18 MED ORDER — INSULIN LISPRO (1 UNIT DIAL) 100 UNIT/ML (KWIKPEN): 14.0000 [IU] | PEN_INJECTOR | Freq: Three times a day (TID) | SUBCUTANEOUS | 1 refills | Status: DC

## 2018-09-21 ENCOUNTER — Other Ambulatory Visit: Payer: Self-pay | Admitting: "Endocrinology

## 2018-09-24 ENCOUNTER — Telehealth: Payer: Self-pay | Admitting: "Endocrinology

## 2018-09-24 MED ORDER — ACCU-CHEK AVIVA PLUS VI STRP: ORAL_STRIP | 0 refills | Status: DC

## 2018-09-24 NOTE — Telephone Encounter (Signed)
Patient called and said that [facility] can not get her test strips to her until Monday & they went ahead and said it was ok for her to get a script called in today at [place] [facility] to get her by until monday

## 2018-09-24 NOTE — Telephone Encounter (Signed)
Rx sent 

## 2018-10-08 ENCOUNTER — Ambulatory Visit: Payer: Medicare Other | Admitting: "Endocrinology

## 2018-10-10 LAB — COMPLETE METABOLIC PANEL WITH GFR
AG Ratio: 1.8 (calc) (ref 1.0–2.5)
ALT: 31 U/L — ABNORMAL HIGH (ref 6–29)
AST: 49 U/L — ABNORMAL HIGH (ref 10–35)
Albumin: 4.5 g/dL (ref 3.6–5.1)
Alkaline phosphatase (APISO): 56 U/L (ref 37–153)
BUN: 11 mg/dL (ref 7–25)
CO2: 27 mmol/L (ref 20–32)
Calcium: 9.7 mg/dL (ref 8.6–10.4)
Chloride: 102 mmol/L (ref 98–110)
Creat: 0.77 mg/dL (ref 0.50–0.99)
GFR, Est African American: 94 mL/min/{1.73_m2} (ref >=60)
GFR, Est Non African American: 81 mL/min/{1.73_m2} (ref >=60)
Globulin: 2.5 g/dL (calc) (ref 1.9–3.7)
Glucose, Bld: 109 mg/dL (ref 65–139)
Potassium: 4.7 mmol/L (ref 3.5–5.3)
Sodium: 137 mmol/L (ref 135–146)
Total Bilirubin: 0.2 mg/dL (ref 0.2–1.2)
Total Protein: 7 g/dL (ref 6.1–8.1)

## 2018-10-10 LAB — HEMOGLOBIN A1C
Hgb A1c MFr Bld: 8.6 % of total Hgb — ABNORMAL HIGH (ref <5.7)
Mean Plasma Glucose: 200 (calc)
eAG (mmol/L): 11.1 (calc)

## 2018-10-16 ENCOUNTER — Other Ambulatory Visit: Payer: Self-pay

## 2018-10-16 ENCOUNTER — Encounter: Payer: Self-pay | Admitting: "Endocrinology

## 2018-10-16 ENCOUNTER — Ambulatory Visit (INDEPENDENT_AMBULATORY_CARE_PROVIDER_SITE_OTHER): Payer: Medicare Other | Admitting: "Endocrinology

## 2018-10-16 DIAGNOSIS — E1165 Type 2 diabetes mellitus with hyperglycemia: Secondary | ICD-10-CM

## 2018-10-16 DIAGNOSIS — E782 Mixed hyperlipidemia: Secondary | ICD-10-CM | POA: Diagnosis not present

## 2018-10-16 DIAGNOSIS — E118 Type 2 diabetes mellitus with unspecified complications: Secondary | ICD-10-CM | POA: Diagnosis not present

## 2018-10-16 DIAGNOSIS — I10 Essential (primary) hypertension: Secondary | ICD-10-CM

## 2018-10-16 DIAGNOSIS — Z794 Long term (current) use of insulin: Secondary | ICD-10-CM

## 2018-10-16 DIAGNOSIS — IMO0002 Reserved for concepts with insufficient information to code with codable children: Secondary | ICD-10-CM

## 2018-10-16 MED ORDER — TOUJEO MAX SOLOSTAR 300 UNIT/ML ~~LOC~~ SOPN: 70.0000 [IU] | PEN_INJECTOR | Freq: Every day | SUBCUTANEOUS | 2 refills | Status: DC

## 2018-10-16 NOTE — Progress Notes (Signed)
10/16/2018                                                    Endocrinology Telehealth Visit Follow up Note -During COVID -19 Pandemic  This visit type was conducted due to national recommendations for restrictions regarding the COVID-19 Pandemic  in an effort to limit this patient's exposure and mitigate transmission of the corona virus.  Due to her co-morbid illnesses, Melanie Schultz is at  moderate to high risk for complications without adequate follow up.  This format is felt to be most appropriate for her at this time.  I connected with this patient on 10/16/2018   by telephone and verified that I am speaking with the correct person using two identifiers. Melanie Schultz, [id number]. she has verbally consented to this visit. All issues noted in this document were discussed and addressed. The format was not optimal for physical exam.     Subjective:    Patient ID: Melanie Schultz, female    DOB: 02/05/1955,    Past Medical History:  Diagnosis Date  . Diabetes mellitus without complication ([place])   . Multiple sclerosis ([facility])    Past Surgical History:  Procedure Laterality Date  . BREAST BIOPSY Left   . BREAST EXCISIONAL BIOPSY Left    benign  . CESAREAN SECTION    . NASAL SEPTUM SURGERY     Social History   Socioeconomic History  . Marital status: Married    Spouse name: Not on file  . Number of children: Not on file  . Years of education: Not on file  . Highest education level: Not on file  Occupational History  . Not on file  Social Needs  . Financial resource strain: Not on file  . Food insecurity    Worry: Not on file    Inability: Not on file  . Transportation needs    Medical: Not on file    Non-medical: Not on file  Tobacco Use  . Smoking status: Former Research scientist (life sciences)  . Smokeless tobacco: Never Used  Substance and Sexual Activity  . Alcohol use: No    Alcohol/week: 0.0 standard drinks  . Drug use: No  . Sexual activity: Not on file  Lifestyle  . Physical activity     Days per week: Not on file    Minutes per session: Not on file  . Stress: Not on file  Relationships  . Social Herbalist on phone: Not on file    Gets together: Not on file    Attends religious service: Not on file    Active member of club or organization: Not on file    Attends meetings of clubs or organizations: Not on file    Relationship status: Not on file  Other Topics Concern  . Not on file  Social History Narrative  . Not on file   Outpatient Encounter Medications as of 10/16/2018  Medication Sig  . Accu-Chek Softclix Lancets lancets 100 each by Other route 4 (four) times daily. Use as instructed  . atenolol (TENORMIN) 50 MG tablet Take 1 tablet (50 mg total) by mouth 2 (two) times daily.  . B-D ULTRAFINE III SHORT PEN 31G X 8 MM MISC 1 each by Other route 4 (four) times daily.  . Cholecalciferol (VITAMIN D3) 125 MCG (5000 UT) CAPS  Take by mouth daily.  Marland Kitchen glucose blood (ACCU-CHEK AVIVA PLUS) test strip TEST FOUR TIMES DAILY. E11.65  . Insulin Glargine, 2 Unit Dial, (TOUJEO MAX SOLOSTAR) 300 UNIT/ML SOPN Inject 70 Units into the skin at bedtime.  . insulin lispro (HUMALOG KWIKPEN) 100 UNIT/ML KwikPen Inject 0.14-0.2 mLs (14-20 Units total) into the skin 3 (three) times daily before meals.  Marland Kitchen losartan (COZAAR) 25 MG tablet daily.  . metFORMIN (GLUCOPHAGE-XR) 500 MG 24 hr tablet TAKE 2 TABLETS BY MOUTH ONCE DAILY WITH BREAKFAST  . methylphenidate (RITALIN) 10 MG tablet Take 40 mg by mouth daily.  Marland Kitchen omeprazole (PRILOSEC) 20 MG capsule Take 20 mg by mouth daily.  . rosuvastatin (CRESTOR) 20 MG tablet Take 20 mg by mouth daily.  . Teriflunomide 14 MG TABS Take by mouth.  . [DISCONTINUED] TOUJEO MAX SOLOSTAR 300 UNIT/ML SOPN INJECT 60 UNITS SUBCUTANEOUSLY AT BEDTIME. AFTER YOU FINISH WITH SOLIQUA   No facility-administered encounter medications on file as of 10/16/2018.    ALLERGIES: Allergies  Allergen Reactions  . Lisinopril    VACCINATION STATUS:  There is  no immunization history on file for this patient.  Diabetes She presents for her follow-up diabetic visit. She has type 2 diabetes mellitus. Onset time: She was diagnosed at approximate age of 63 years. Her disease course has been improving. There are no hypoglycemic associated symptoms. Pertinent negatives for hypoglycemia include no confusion, headaches, pallor or seizures. Pertinent negatives for diabetes include no chest pain, no fatigue, no polydipsia, no polyphagia and no polyuria. There are no hypoglycemic complications. Symptoms are improving. There are no diabetic complications. Risk factors for coronary artery disease include dyslipidemia, hypertension and obesity. Current diabetic treatment includes insulin injections and oral agent (dual therapy). She is compliant with treatment most of the time. Her weight is increasing steadily. She is following a generally unhealthy diet. She has had a previous visit with a dietitian. She participates in exercise intermittently. There is no change in her home blood glucose trend. Her breakfast blood glucose range is generally 140-180 mg/dl. Her lunch blood glucose range is generally 140-180 mg/dl. Her dinner blood glucose range is generally 140-180 mg/dl. Her bedtime blood glucose range is generally 140-180 mg/dl. Her overall blood glucose range is 140-180 mg/dl. An ACE inhibitor/angiotensin II receptor blocker is being taken. Eye exam is current.  Hyperlipidemia This is a chronic problem. The current episode started more than 1 year ago. Exacerbating diseases include diabetes and obesity. Pertinent negatives include no chest pain, myalgias or shortness of breath. Current antihyperlipidemic treatment includes statins. Risk factors for coronary artery disease include diabetes mellitus, dyslipidemia, a sedentary lifestyle and post-menopausal.  Hypertension This is a chronic problem. The current episode started more than 1 year ago. Pertinent negatives include no  chest pain, headaches, palpitations or shortness of breath. Risk factors for coronary artery disease include diabetes mellitus, dyslipidemia, post-menopausal state, sedentary lifestyle and smoking/tobacco exposure. Past treatments include ACE inhibitors.      Objective:    There were no vitals taken for this visit.  Wt Readings from Last 3 Encounters:  06/15/18 203 lb (92.1 kg)  02/27/18 203 lb (92.1 kg)  02/11/18 203 lb (92.1 kg)     Results for orders placed or performed in visit on 08/13/18  Novel Coronavirus, NAA ([facility])  Result Value Ref Range   SARS-CoV-2, NAA Not Detected Not Detected   Lipid Panel     Component Value Date/Time   CHOL 144 09/04/2016 1036   TRIG 283 (H) 09/04/2016  1036   HDL 28 (L) 09/04/2016 1036   CHOLHDL 5.1 (H) 09/04/2016 1036   VLDL 57 (H) 09/04/2016 1036   LDLCALC 59 09/04/2016 1036   Recent Results (from the past 2160 hour(s))  Novel Coronavirus, NAA ([facility])     Status: None   Collection Time: 08/13/18  1:50 PM  Result Value Ref Range   SARS-CoV-2, NAA Not Detected Not Detected    Comment: Testing was performed using the Aptima SARS-CoV-2 assay. This test was developed and its performance characteristics determined by [employer]. This test has not been FDA cleared or approved. This test has been authorized by FDA under an Emergency Use Authorization (EUA). This test is only authorized for the duration of time the declaration that circumstances exist justifying the authorization of the emergency use of in vitro diagnostic tests for detection of SARS-CoV-2 virus and/or diagnosis of COVID-19 infection under section 564(b)(1) of the Act, 21 U.S.C. KA:123727), unless the authorization is terminated or revoked sooner. When diagnostic testing is negative, the possibility of a false negative result should be considered in the context of a patient's recent exposures and the presence of clinical signs and symptoms consistent with  COVID-19. An individual without symptoms of COVID-19 and who is not shedding SARS-CoV-2 virus would expect to have a negativ e (not detected) result in this assay.   Hemoglobin A1c     Status: Abnormal   Collection Time: 10/09/18  3:01 PM  Result Value Ref Range   Hgb A1c MFr Bld 8.6 (H) <5.7 % of total Hgb    Comment: For someone without known diabetes, a hemoglobin A1c value of 6.5% or greater indicates that they may have  diabetes and this should be confirmed with a follow-up  test. . For someone with known diabetes, a value <7% indicates  that their diabetes is well controlled and a value  greater than or equal to 7% indicates suboptimal  control. A1c targets should be individualized based on  duration of diabetes, age, comorbid conditions, and  other considerations. . Currently, no consensus exists regarding use of hemoglobin A1c for diagnosis of diabetes for children. .    Mean Plasma Glucose 200 (calc)   eAG (mmol/L) 11.1 (calc)  COMPLETE METABOLIC PANEL WITH GFR     Status: Abnormal   Collection Time: 10/09/18  3:01 PM  Result Value Ref Range   Glucose, Bld 109 65 - 139 mg/dL    Comment: .        Non-fasting reference interval .    BUN 11 7 - 25 mg/dL   Creat 0.77 0.50 - 0.99 mg/dL    Comment: For patients >63 years of age, the reference limit for Creatinine is approximately 13% higher for people identified as African-American. .    GFR, Est Non African American 81 > OR = 60 mL/min/1.77m2   GFR, Est African American 94 > OR = 60 mL/min/1.59m2   BUN/Creatinine Ratio NOT APPLICABLE 6 - 22 (calc)   Sodium 137 135 - 146 mmol/L   Potassium 4.7 3.5 - 5.3 mmol/L   Chloride 102 98 - 110 mmol/L   CO2 27 20 - 32 mmol/L   Calcium 9.7 8.6 - 10.4 mg/dL   Total Protein 7.0 6.1 - 8.1 g/dL   Albumin 4.5 3.6 - 5.1 g/dL   Globulin 2.5 1.9 - 3.7 g/dL (calc)   AG Ratio 1.8 1.0 - 2.5 (calc)   Total Bilirubin 0.2 0.2 - 1.2 mg/dL   Alkaline phosphatase (APISO) 56 37 -  153 U/L    AST 49 (H) 10 - 35 U/L   ALT 31 (H) 6 - 29 U/L     Assessment & Plan:   1. Uncontrolled type 2 diabetes mellitus with complication, with long-term current use of insulin ([place])  She reports improving and near target glycemic profile, A1c of 8.6% improving from 9.6%.    Recent labs reviewed. - Patient remains at a high risk for more acute and chronic complications of diabetes which include CAD, CVA, CKD, retinopathy, and neuropathy. These are all discussed in detail with the patient.  - I have re-counseled the patient on diet management and weight loss  by adopting a carbohydrate restricted / protein rich  Diet. - Patient is advised to stick to a routine mealtimes to eat 3 meals  a day and avoid unnecessary snacks ( to snack only to correct hypoglycemia).  - she  admits there is a room for improvement in her diet and drink choices. -  Suggestion is made for her to avoid simple carbohydrates  from her diet including Cakes, Sweet Desserts / Pastries, Ice Cream, Soda (diet and regular), Sweet Tea, Candies, Chips, Cookies, Sweet Pastries,  Store Bought Juices, Alcohol in Excess of  1-2 drinks a day, Artificial Sweeteners, Coffee Creamer, and "Sugar-free" Products. This will help patient to have stable blood glucose profile and potentially avoid unintended weight gain.   - I have approached patient with the following individualized plan to manage diabetes and patient agrees.   -Based on her presentation with improved and near target glycemic profile, she will continue to benefit from intensive treatment with basal/bolus insulin.    -She is advised to increase Toujeo to 70 units nightly, advised to continue Humalog 14-20  units 3 times daily AC for pre-meal blood glucose readings above 90 mg. She is urged to continue to monitor blood glucose strictly 4 times a day- before meals and at bedtime.    -Patient is encouraged to call clinic for blood glucose levels less than 70 or above 300 mg /dl.     -She is advised to continue metformin 1000 mg ER p.o. daily.   - Patient specific target  for A1c; LDL, HDL, Triglycerides, and  Waist Circumference were discussed in detail.  2) BP/HTN: she is advised to home monitor blood pressure and report if > 140/90 on 2 separate readings.  She is advised to continue her current blood pressure medications including losartan 25 mg p.o. daily.  She will be considered for higher dose of losartan during her next visit.   3) Lipids/HPL: She is advised to continue Lipitor 80 mg p.o. nightly.  Her recent lipid panel showed controlled LDL at 59.  She will be considered for lower dose of Lipitor after her next visit.   4)  Weight/Diet: CDE consult in progress, exercise, and carbohydrates information provided.  5) Chronic Care/Health Maintenance:  -Patient  on ACEI and Statin medications and encouraged to continue to follow up with Ophthalmology, Podiatrist at least yearly or according to recommendations, and advised to  stay away from smoking. I have recommended yearly flu vaccine and pneumonia vaccination at least every 5 years; moderate intensity exercise for up to 150 minutes weekly; and  sleep for at least 7 hours a day.  I advised patient to maintain close follow up with her PCP for primary care needs.  - Patient Care Time Today:  25 min, of which >50% was spent in  counseling and the rest reviewing her  current  and  previous labs/studies, previous treatments, her blood glucose readings, and medications' doses and developing a plan for long-term care based on the latest recommendations for standards of care.   Royetta Crochet Krabill participated in the discussions, expressed understanding, and voiced agreement with the above plans.  All questions were answered to her satisfaction. she is encouraged to contact clinic should she have any questions or concerns prior to her return visit.    Follow up plan: Return in about 4 months (around 02/15/2019) for Bring Meter  and Logs- A1c in Office, Include 8 log sheets.  Glade Lloyd, MD Phone: [phone]  Fax: [phone]   -  This note was partially dictated with voice recognition software. Similar sounding words can be transcribed inadequately or may not  be corrected upon review.  10/16/2018, 12:27 PM

## 2018-10-28 ENCOUNTER — Other Ambulatory Visit (HOSPITAL_COMMUNITY): Payer: Self-pay | Admitting: Family Medicine

## 2018-10-28 DIAGNOSIS — Z1231 Encounter for screening mammogram for malignant neoplasm of breast: Secondary | ICD-10-CM

## 2018-12-14 ENCOUNTER — Other Ambulatory Visit: Payer: Self-pay | Admitting: "Endocrinology

## 2018-12-14 DIAGNOSIS — E1165 Type 2 diabetes mellitus with hyperglycemia: Secondary | ICD-10-CM

## 2018-12-14 DIAGNOSIS — IMO0002 Reserved for concepts with insufficient information to code with codable children: Secondary | ICD-10-CM

## 2018-12-17 ENCOUNTER — Other Ambulatory Visit: Payer: Self-pay | Admitting: "Endocrinology

## 2019-01-06 ENCOUNTER — Other Ambulatory Visit: Payer: Self-pay | Admitting: "Endocrinology

## 2019-02-18 ENCOUNTER — Other Ambulatory Visit: Payer: Self-pay

## 2019-02-18 ENCOUNTER — Encounter: Payer: Self-pay | Admitting: "Endocrinology

## 2019-02-18 ENCOUNTER — Ambulatory Visit: Payer: Medicare Other | Admitting: "Endocrinology

## 2019-02-18 VITALS — BP 143/86 | HR 77 | Ht 65.0 in | Wt 214.0 lb

## 2019-02-18 DIAGNOSIS — E118 Type 2 diabetes mellitus with unspecified complications: Secondary | ICD-10-CM

## 2019-02-18 DIAGNOSIS — Z794 Long term (current) use of insulin: Secondary | ICD-10-CM

## 2019-02-18 DIAGNOSIS — E1165 Type 2 diabetes mellitus with hyperglycemia: Secondary | ICD-10-CM

## 2019-02-18 DIAGNOSIS — E782 Mixed hyperlipidemia: Secondary | ICD-10-CM | POA: Diagnosis not present

## 2019-02-18 DIAGNOSIS — IMO0002 Reserved for concepts with insufficient information to code with codable children: Secondary | ICD-10-CM

## 2019-02-18 DIAGNOSIS — I10 Essential (primary) hypertension: Secondary | ICD-10-CM | POA: Diagnosis not present

## 2019-02-18 LAB — POCT GLYCOSYLATED HEMOGLOBIN (HGB A1C): Hemoglobin A1C: 9.4 % — AB (ref 4.0–5.6)

## 2019-02-18 MED ORDER — TOUJEO MAX SOLOSTAR 300 UNIT/ML ~~LOC~~ SOPN: 80.0000 [IU] | PEN_INJECTOR | Freq: Every day | SUBCUTANEOUS | 2 refills | Status: DC

## 2019-02-18 MED ORDER — HUMALOG KWIKPEN 100 UNIT/ML ~~LOC~~ SOPN: 20.0000 [IU] | PEN_INJECTOR | Freq: Three times a day (TID) | SUBCUTANEOUS | 2 refills | Status: DC

## 2019-02-18 NOTE — Patient Instructions (Signed)
                                     Advice for Weight Management  -For most of us the best way to lose weight is by diet management. Generally speaking, diet management means consuming less calories intentionally which over time brings about progressive weight loss.  This can be achieved more effectively by restricting carbohydrate consumption to the minimum possible.  So, it is critically important to know your numbers: how much calorie you are consuming and how much calorie you need. More importantly, our carbohydrates sources should be unprocessed or minimally processed complex starch food items.   Sometimes, it is important to balance nutrition by increasing protein intake (animal or plant source), fruits, and vegetables.  -Sticking to a routine mealtime to eat 3 meals a day and avoiding unnecessary snacks is shown to have a big role in weight control. Under normal circumstances, the only time we lose real weight is when we are hungry, so allow hunger to take place- hunger means no food between meal times, only water.  It is not advisable to starve.   -It is better to avoid simple carbohydrates including: Cakes, Sweet Desserts, Ice Cream, Soda (diet and regular), Sweet Tea, Candies, Chips, Cookies, Store Bought Juices, Alcohol in Excess of  1-2 drinks a day, Artificial Sweeteners, Doughnuts, Coffee Creamers, "Sugar-free" Products, etc, etc.  This is not a complete list.....    -Consulting with certified diabetes educators is proven to provide you with the most accurate and current information on diet.  Also, you may be  interested in discussing diet options/exchanges , we can schedule a visit with Penny Crumpton, RDN, CDE for individualized nutrition education.  -Exercise: If you are able: 30 -60 minutes a day ,4 days a week, or 150 minutes a week.  The longer the better.  Combine stretch, strength, and aerobic activities.  If you were told in the past that you  have high risk for cardiovascular diseases, you may seek evaluation by your heart doctor prior to initiating moderate to intense exercise programs.                                  Additional Care Considerations for Diabetes   -Diabetes  is a chronic disease.  The most important care consideration is regular follow-up with your diabetes care provider with the goal being avoiding or delaying its complications and to take advantage of advances in medications and technology.    -Type 2 diabetes is known to coexist with other important comorbidities such as high blood pressure and high cholesterol.  It is critical to control not only the diabetes but also the high blood pressure and high cholesterol to minimize and delay the risk of complications including coronary artery disease, stroke, amputations, blindness, etc.    - Studies showed that people with diabetes will benefit from a class of medications known as ACE inhibitors and statins.  Unless there are specific reasons not to be on these medications, the standard of care is to consider getting one from these groups of medications at an optimal doses.  These medications are generally considered safe and proven to help protect the heart and the kidneys.    - People with diabetes are encouraged to initiate and maintain regular follow-up with eye doctors, foot doctors, dentists ,   and if necessary heart and kidney doctors.     - It is highly recommended that people with diabetes quit smoking or stay away from smoking, and get yearly  flu vaccine and pneumonia vaccine at least every 5 years.  One other important lifestyle recommendation is to ensure adequate sleep - at least 6-7 hours of uninterrupted sleep at night.  -Exercise: If you are able: 30 -60 minutes a day, 4 days a week, or 150 minutes a week.  The longer the better.  Combine stretch, strength, and aerobic activities.  If you were told in the past that you have high risk for cardiovascular  diseases, you may seek evaluation by your heart doctor prior to initiating moderate to intense exercise programs.     COVID-19 Vaccine Information can be found at: [email or web address] For questions related to vaccine distribution or appointments, please email [email or web address] or call [phone]        

## 2019-02-18 NOTE — Progress Notes (Signed)
02/18/2019   Endocrinology follow-up note   Subjective:    Patient ID: Melanie Schultz, female    DOB: 07/03/1955,    Past Medical History:  Diagnosis Date  . Diabetes mellitus without complication ([place])   . Multiple sclerosis ([place])    Past Surgical History:  Procedure Laterality Date  . BREAST BIOPSY Left   . BREAST EXCISIONAL BIOPSY Left    benign  . CESAREAN SECTION    . NASAL SEPTUM SURGERY     Social History   Socioeconomic History  . Marital status: Married    Spouse name: Not on file  . Number of children: Not on file  . Years of education: Not on file  . Highest education level: Not on file  Occupational History  . Not on file  Tobacco Use  . Smoking status: Former Research scientist (life sciences)  . Smokeless tobacco: Never Used  Substance and Sexual Activity  . Alcohol use: No    Alcohol/week: 0.0 standard drinks  . Drug use: No  . Sexual activity: Not on file  Other Topics Concern  . Not on file  Social History Narrative  . Not on file   Social Determinants of Health   Financial Resource Strain:   . Difficulty of Paying Living Expenses: Not on file  Food Insecurity:   . Worried About Charity fundraiser in the Last Year: Not on file  . Ran Out of Food in the Last Year: Not on file  Transportation Needs:   . Lack of Transportation (Medical): Not on file  . Lack of Transportation (Non-Medical): Not on file  Physical Activity:   . Days of Exercise per Week: Not on file  . Minutes of Exercise per Session: Not on file  Stress:   . Feeling of Stress : Not on file  Social Connections:   . Frequency of Communication with Friends and Family: Not on file  . Frequency of Social Gatherings with Friends and Family: Not on file  . Attends Religious Services: Not on file  . Active Member of Clubs or Organizations: Not on file  . Attends Archivist Meetings: Not on file  . Marital Status: Not on file   Outpatient Encounter Medications as of 02/18/2019  Medication Sig   . Accu-Chek Softclix Lancets lancets 100 each by Other route 4 (four) times daily. Use as instructed  . atenolol (TENORMIN) 50 MG tablet Take 1 tablet (50 mg total) by mouth 2 (two) times daily.  . B-D ULTRAFINE III SHORT PEN 31G X 8 MM MISC 1 each by Other route 4 (four) times daily.  . Cholecalciferol (VITAMIN D3) 125 MCG (5000 UT) CAPS Take by mouth daily.  Marland Kitchen glucose blood (ACCU-CHEK AVIVA PLUS) test strip TEST FOUR TIMES DAILY. E11.65  . HUMALOG KWIKPEN 100 UNIT/ML KwikPen Inject 0.2-0.26 mLs (20-26 Units total) into the skin 3 (three) times daily before meals.  . Insulin Glargine, 2 Unit Dial, (TOUJEO MAX SOLOSTAR) 300 UNIT/ML SOPN Inject 80 Units into the skin at bedtime.  Marland Kitchen losartan (COZAAR) 25 MG tablet daily.  . metFORMIN (GLUCOPHAGE-XR) 500 MG 24 hr tablet TAKE 2 TABLETS BY MOUTH ONCE DAILY WITH BREAKFAST  . methylphenidate (RITALIN) 10 MG tablet Take 40 mg by mouth daily.  Marland Kitchen omeprazole (PRILOSEC) 20 MG capsule Take 20 mg by mouth daily.  . rosuvastatin (CRESTOR) 20 MG tablet Take 20 mg by mouth daily.  . Teriflunomide 14 MG TABS Take by mouth.  . [DISCONTINUED] HUMALOG KWIKPEN 100 UNIT/ML KwikPen INJECT  0.14-0.2 MLS (14-20 UNITS TOTAL) INTO THE SKIN 3 TIMES DAILY BEFORE MEALS  . [DISCONTINUED] TOUJEO MAX SOLOSTAR 300 UNIT/ML SOPN INJECT 70 UNITS SUBCUTANEOUSLY AT BEDTIME   No facility-administered encounter medications on file as of 02/18/2019.   ALLERGIES: Allergies  Allergen Reactions  . Lisinopril    VACCINATION STATUS:  There is no immunization history on file for this patient.  Diabetes She presents for her follow-up diabetic visit. She has type 2 diabetes mellitus. Onset time: She was diagnosed at approximate age of 78 years. Her disease course has been improving. There are no hypoglycemic associated symptoms. Pertinent negatives for hypoglycemia include no confusion, headaches, pallor or seizures. Pertinent negatives for diabetes include no chest pain, no fatigue, no  polydipsia, no polyphagia and no polyuria. There are no hypoglycemic complications. Symptoms are improving. There are no diabetic complications. Risk factors for coronary artery disease include dyslipidemia, hypertension and obesity. Current diabetic treatment includes insulin injections and oral agent (dual therapy). She is compliant with treatment most of the time. Her weight is increasing steadily. She is following a generally unhealthy diet. She has had a previous visit with a dietitian. She participates in exercise intermittently. There is no change in her home blood glucose trend. Her breakfast blood glucose range is generally 140-180 mg/dl. Her lunch blood glucose range is generally 140-180 mg/dl. Her dinner blood glucose range is generally 140-180 mg/dl. Her bedtime blood glucose range is generally 140-180 mg/dl. Her overall blood glucose range is 140-180 mg/dl. An ACE inhibitor/angiotensin II receptor blocker is being taken. Eye exam is current.  Hyperlipidemia This is a chronic problem. The current episode started more than 1 year ago. Exacerbating diseases include diabetes and obesity. Pertinent negatives include no chest pain, myalgias or shortness of breath. Current antihyperlipidemic treatment includes statins. Risk factors for coronary artery disease include diabetes mellitus, dyslipidemia, a sedentary lifestyle and post-menopausal.  Hypertension This is a chronic problem. The current episode started more than 1 year ago. Pertinent negatives include no chest pain, headaches, palpitations or shortness of breath. Risk factors for coronary artery disease include diabetes mellitus, dyslipidemia, post-menopausal state, sedentary lifestyle and smoking/tobacco exposure. Past treatments include ACE inhibitors.    Review of systems:  Constitutional: + weight gain, no fatigue, no subjective hyperthermia, no subjective hypothermia Eyes: no blurry vision, no xerophthalmia ENT: no sore throat, no  nodules palpated in throat, no dysphagia/odynophagia, no hoarseness Cardiovascular: no Chest Pain, no Shortness of Breath, no palpitations, no leg swelling Respiratory: no cough, no SOB Gastrointestinal: no Nausea/Vomiting/Diarhhea Musculoskeletal: no muscle/joint aches Skin: no rashes Neurological: no tremors, no numbness, no tingling, no dizziness Psychiatric: no depression, no anxiety   Objective:    BP (!) 143/86   Pulse 77   Ht 5\' 5"  (1.651 m)   Wt 214 lb (97.1 kg)   BMI 35.61 kg/m   Wt Readings from Last 3 Encounters:  02/18/19 214 lb (97.1 kg)  06/15/18 203 lb (92.1 kg)  02/27/18 203 lb (92.1 kg)     Physical Exam- Limited  Constitutional:  Body mass index is 35.61 kg/m. , not in acute distress, normal state of mind Eyes:  EOMI, no exophthalmos Neck: Supple Respiratory: Adequate breathing efforts Musculoskeletal: no gross deformities, strength intact in all four extremities, no gross restriction of joint movements Skin:  no rashes, no hyperemia Neurological: no tremor with outstretched hands.   Results for orders placed or performed in visit on 02/18/19  HgB A1c  Result Value Ref Range   Hemoglobin A1C 9.4 (  A) 4.0 - 5.6 %   HbA1c POC (<> result, manual entry)     HbA1c, POC (prediabetic range)     HbA1c, POC (controlled diabetic range)     Lipid Panel     Component Value Date/Time   CHOL 144 09/04/2016 1036   TRIG 283 (H) 09/04/2016 1036   HDL 28 (L) 09/04/2016 1036   CHOLHDL 5.1 (H) 09/04/2016 1036   VLDL 57 (H) 09/04/2016 1036   LDLCALC 59 09/04/2016 1036   Recent Results (from the past 2160 hour(s))  HgB A1c     Status: Abnormal   Collection Time: 02/18/19 10:36 AM  Result Value Ref Range   Hemoglobin A1C 9.4 (A) 4.0 - 5.6 %   HbA1c POC (<> result, manual entry)     HbA1c, POC (prediabetic range)     HbA1c, POC (controlled diabetic range)       Assessment & Plan:   1. Uncontrolled type 2 diabetes mellitus with complication, with long-term  current use of insulin ([place])  She resents with significantly above target glycemic profile both fasting and postprandial.  Her point-of-care A1c is 9.4% increasing from 8.6%.      Recent labs reviewed. - Patient remains at a high risk for more acute and chronic complications of diabetes which include CAD, CVA, CKD, retinopathy, and neuropathy. These are all discussed in detail with the patient.  - I have re-counseled the patient on diet management and weight loss  by adopting a carbohydrate restricted / protein rich  Diet. - Patient is advised to stick to a routine mealtimes to eat 3 meals  a day and avoid unnecessary snacks ( to snack only to correct hypoglycemia).  - she  admits there is a room for improvement in her diet and drink choices. -  Suggestion is made for her to avoid simple carbohydrates  from her diet including Cakes, Sweet Desserts / Pastries, Ice Cream, Soda (diet and regular), Sweet Tea, Candies, Chips, Cookies, Sweet Pastries,  Store Bought Juices, Alcohol in Excess of  1-2 drinks a day, Artificial Sweeteners, Coffee Creamer, and "Sugar-free" Products. This will help patient to have stable blood glucose profile and potentially avoid unintended weight gain.   - I have approached patient with the following individualized plan to manage diabetes and patient agrees.   -Based on her presentation with improved and near target glycemic profile, she will continue to benefit from intensive treatment with higher dose of basal/bolus insulin.    -She is advised to increase Toujeo to 80 units nightly, increase Humalog to 20-26  units 3 times daily AC for pre-meal blood glucose readings above 90 mg. She is urged to continue to monitor blood glucose strictly 4 times a day- before meals and at bedtime.    -Patient is encouraged to call clinic for blood glucose levels less than 70 or above 300 mg /dl.    -She is advised to continue metformin 1000 mg ER p.o. daily.   - Patient specific  target  for A1c; LDL, HDL, Triglycerides, and  Waist Circumference were discussed in detail.  2) BP/HTN:  -Her blood pressure is not controlled to target.  She is advised to continue her current blood pressure medications including losartan 25 mg p.o. daily.  She will be considered for higher dose of losartan during her next visit.   3) Lipids/HPL: She is advised to continue Lipitor 80 mg p.o. nightly.  Her recent lipid panel showed controlled LDL at 59.  She he is advised to  continue Crestor 20 mg p.o. nightly.   4)  Weight/Diet: CDE consult in progress, exercise, and carbohydrates information provided.  5) Chronic Care/Health Maintenance:  -Patient  on losartan 25 mg p.o. daily, Crestor 20 mg p.o. nightly  and encouraged to continue to follow up with Ophthalmology, Podiatrist at least yearly or according to recommendations, and advised to  stay away from smoking. I have recommended yearly flu vaccine and pneumonia vaccination at least every 5 years; moderate intensity exercise for up to 150 minutes weekly; and  sleep for at least 7 hours a day.  I advised patient to maintain close follow up with her PCP for primary care needs.   - Time spent on this patient care encounter:  35 min, of which > 50% was spent in  counseling and the rest reviewing her blood glucose logs , discussing her hypoglycemia and hyperglycemia episodes, reviewing her current and  previous labs / studies  ( including abstraction from other facilities) and medications  doses and developing a  long term treatment plan and documenting her care.   Please refer to Patient Instructions for Blood Glucose Monitoring and Insulin/Medications Dosing Guide"  in media tab for additional information. Please  also refer to " Patient Self Inventory" in the Media  tab for reviewed elements of pertinent patient history.  Royetta Crochet Tokunaga participated in the discussions, expressed understanding, and voiced agreement with the above plans.  All  questions were answered to her satisfaction. she is encouraged to contact clinic should she have any questions or concerns prior to her return visit.   Follow up plan: Return in about 4 months (around 06/18/2019) for Bring Meter and Logs- A1c in Office, Follow up with Pre-visit Labs.  Glade Lloyd, MD Phone: [phone]  Fax: [phone]   -  This note was partially dictated with voice recognition software. Similar sounding words can be transcribed inadequately or may not  be corrected upon review.  02/18/2019, 11:27 AM

## 2019-03-13 ENCOUNTER — Other Ambulatory Visit: Payer: Self-pay | Admitting: "Endocrinology

## 2019-03-16 ENCOUNTER — Other Ambulatory Visit: Payer: Self-pay

## 2019-03-16 DIAGNOSIS — E1165 Type 2 diabetes mellitus with hyperglycemia: Secondary | ICD-10-CM

## 2019-03-16 DIAGNOSIS — IMO0002 Reserved for concepts with insufficient information to code with codable children: Secondary | ICD-10-CM

## 2019-03-18 ENCOUNTER — Ambulatory Visit: Payer: Medicare Other | Admitting: Nutrition

## 2019-03-22 ENCOUNTER — Other Ambulatory Visit: Payer: Self-pay | Admitting: "Endocrinology

## 2019-04-07 ENCOUNTER — Other Ambulatory Visit: Payer: Self-pay | Admitting: Neurology

## 2019-04-07 ENCOUNTER — Other Ambulatory Visit (HOSPITAL_COMMUNITY): Payer: Self-pay | Admitting: Neurology

## 2019-04-07 DIAGNOSIS — R4189 Other symptoms and signs involving cognitive functions and awareness: Secondary | ICD-10-CM | POA: Insufficient documentation

## 2019-04-07 DIAGNOSIS — G35 Multiple sclerosis: Secondary | ICD-10-CM

## 2019-05-06 ENCOUNTER — Other Ambulatory Visit: Payer: Self-pay

## 2019-05-06 ENCOUNTER — Ambulatory Visit (HOSPITAL_COMMUNITY)
Admission: RE | Admit: 2019-05-06 | Discharge: 2019-05-06 | Disposition: A | Discharge status: Home / Self Care | Payer: Medicare Other | Source: Ambulatory Visit | Attending: Neurology | Admitting: Neurology

## 2019-05-06 DIAGNOSIS — R4189 Other symptoms and signs involving cognitive functions and awareness: Secondary | ICD-10-CM | POA: Insufficient documentation

## 2019-05-06 DIAGNOSIS — G35 Multiple sclerosis: Secondary | ICD-10-CM | POA: Insufficient documentation

## 2019-05-06 LAB — POCT I-STAT CREATININE: Creatinine, Ser: 0.7 mg/dL (ref 0.44–1.00)

## 2019-05-06 MED ORDER — GADOBUTROL 1 MMOL/ML IV SOLN
10.0000 mL | Freq: Once | INTRAVENOUS | Status: AC | PRN
  Administered 2019-05-06: 10:00:00 10 mL via INTRAVENOUS

## 2019-05-20 ENCOUNTER — Other Ambulatory Visit: Payer: Self-pay | Admitting: Family Medicine

## 2019-05-20 DIAGNOSIS — R911 Solitary pulmonary nodule: Secondary | ICD-10-CM

## 2019-05-24 ENCOUNTER — Ambulatory Visit (HOSPITAL_COMMUNITY)
Admission: RE | Admit: 2019-05-24 | Discharge: 2019-05-24 | Disposition: A | Discharge status: Home / Self Care | Payer: Medicare Other | Source: Ambulatory Visit | Attending: Family Medicine | Admitting: Family Medicine

## 2019-05-24 ENCOUNTER — Other Ambulatory Visit: Payer: Self-pay

## 2019-05-24 DIAGNOSIS — R911 Solitary pulmonary nodule: Secondary | ICD-10-CM

## 2019-05-25 ENCOUNTER — Other Ambulatory Visit (HOSPITAL_BASED_OUTPATIENT_CLINIC_OR_DEPARTMENT_OTHER): Payer: Self-pay

## 2019-05-25 DIAGNOSIS — R0683 Snoring: Secondary | ICD-10-CM

## 2019-05-25 DIAGNOSIS — G4730 Sleep apnea, unspecified: Secondary | ICD-10-CM

## 2019-05-25 DIAGNOSIS — R5383 Other fatigue: Secondary | ICD-10-CM

## 2019-05-25 DIAGNOSIS — G4710 Hypersomnia, unspecified: Secondary | ICD-10-CM

## 2019-05-28 ENCOUNTER — Other Ambulatory Visit: Payer: Self-pay

## 2019-05-28 ENCOUNTER — Ambulatory Visit
Admission: EM | Admit: 2019-05-28 | Discharge: 2019-05-28 | Disposition: A | Discharge status: Home / Self Care | Payer: Medicare Other | Attending: Emergency Medicine | Admitting: Emergency Medicine

## 2019-05-28 DIAGNOSIS — K0889 Other specified disorders of teeth and supporting structures: Secondary | ICD-10-CM

## 2019-05-28 DIAGNOSIS — K047 Periapical abscess without sinus: Secondary | ICD-10-CM | POA: Diagnosis not present

## 2019-05-28 MED ORDER — FLUCONAZOLE 150 MG PO TABS: 150.0000 mg | ORAL_TABLET | Freq: Every day | ORAL | 0 refills | Status: DC

## 2019-05-28 MED ORDER — AMOXICILLIN-POT CLAVULANATE 875-125 MG PO TABS: 1.0000 | ORAL_TABLET | Freq: Two times a day (BID) | ORAL | 0 refills | Status: DC

## 2019-05-28 MED ORDER — CHLORHEXIDINE GLUCONATE 0.12 % MT SOLN: 15.0000 mL | Freq: Two times a day (BID) | OROMUCOSAL | 0 refills | Status: DC

## 2019-05-28 NOTE — Discharge Instructions (Signed)
Augmentin prescribed.  Take as directed and to completion Chlorhexidine mouthwash was prescribed Continue to take ibuprofen 800 mg as needed every 6-8 hours Recommend a soft diet  Maintain proper dental hygiene Follow up with dentist as soon as possible for further evaluation and treatment

## 2019-05-28 NOTE — ED Provider Notes (Addendum)
[facility]    CSN: [id number] Arrival date & time: 05/28/19  1023      History   Chief Complaint Chief Complaint  Patient presents with  . Mouth Lesions    HPI Melanie Schultz is a 64 y.o. female.   Who presented to the urgent care with chief complaint of right upper gum abscess and dental pain for the past few days.  Denies a precipitating event or trauma.  Localizes pain to his right upper gum.  Has tried OTC analgesics without relief.  Worse with chewing.  Does see dentist regularly but was unable to see her today.  Report similar symptoms in the past and she was treated with Augmentin.  Denies fever, chills, dysphagia, odynophagia, oral or neck swelling, nausea, vomiting, chest pain, SOB.    The history is provided by the patient. No language interpreter was used.  Mouth Lesions   Past Medical History:  Diagnosis Date  . Diabetes mellitus without complication ([place])   . Multiple sclerosis [facility])     Patient Active Problem List   Diagnosis Date Noted  . Uncontrolled type 2 diabetes mellitus with complication, with long-term current use of insulin ([place]) 11/24/2014  . Essential hypertension, benign 11/24/2014  . Mixed hyperlipidemia 11/24/2014    Past Surgical History:  Procedure Laterality Date  . BREAST BIOPSY Left   . BREAST EXCISIONAL BIOPSY Left    benign  . CESAREAN SECTION    . NASAL SEPTUM SURGERY      OB History   No obstetric history on file.      Home Medications    Prior to Admission medications   Medication Sig Start Date End Date Taking? Authorizing Provider  Accu-Chek Softclix Lancets lancets 100 each by Other route 4 (four) times daily. Use as instructed 09/17/18   Cassandria Anger, MD  amoxicillin-clavulanate (AUGMENTIN) 875-125 MG tablet Take 1 tablet by mouth every 12 (twelve) hours. 05/28/19   Jalyiah Shelley, Darrelyn Hillock, FNP  atenolol (TENORMIN) 50 MG tablet Take 1 tablet (50 mg total) by mouth 2 (two) times daily. 09/18/18   Cassandria Anger, MD  B-D ULTRAFINE III SHORT PEN 31G X 8 MM MISC USE 1 PEN NEEDLE 4 TIMES DAILY 03/15/19   Cassandria Anger, MD  chlorhexidine (PERIDEX) 0.12 % solution Use as directed 15 mLs in the mouth or throat 2 (two) times daily. 05/28/19   Brylin Stanislawski, Darrelyn Hillock, FNP  Cholecalciferol (VITAMIN D3) 125 MCG (5000 UT) CAPS Take by mouth daily.    [provider]  fluconazole (DIFLUCAN) 150 MG tablet Take 1 tablet (150 mg total) by mouth daily. Take the second dose 3 days after the first dose if symptom does not resolve 05/28/19   [place] Zirkelbach, Darrelyn Hillock, FNP  glucose blood (ACCU-CHEK AVIVA PLUS) test strip TEST FOUR TIMES DAILY. E11.65 09/24/18   Cassandria Anger, MD  HUMALOG KWIKPEN 100 UNIT/ML KwikPen Inject 0.2-0.26 mLs (20-26 Units total) into the skin 3 (three) times daily before meals. 02/18/19   Cassandria Anger, MD  Insulin Glargine, 2 Unit Dial, (TOUJEO MAX SOLOSTAR) 300 UNIT/ML SOPN Inject 80 Units into the skin at bedtime. 02/18/19   Cassandria Anger, MD  losartan (COZAAR) 25 MG tablet daily. 07/30/17   [provider]  metFORMIN (GLUCOPHAGE-XR) 500 MG 24 hr tablet TAKE 2 TABLETS BY MOUTH ONCE DAILY WITH BREAKFAST 03/23/19   Cassandria Anger, MD  methylphenidate (RITALIN) 10 MG tablet Take 40 mg by mouth daily. 03/04/15  [provider]  omeprazole (PRILOSEC) 20 MG capsule Take 20 mg by mouth daily.    [provider]  rosuvastatin (CRESTOR) 20 MG tablet Take 20 mg by mouth daily. 05/07/18   [provider]  Teriflunomide 14 MG TABS Take by mouth.    [provider]    Family History Family History  Problem Relation Age of Onset  . Hypertension Mother   . Cancer Mother     Social History Social History   Tobacco Use  . Smoking status: Former Research scientist (life sciences)  . Smokeless tobacco: Never Used  Substance Use Topics  . Alcohol use: No    Alcohol/week: 0.0 standard drinks  . Drug use: No     Allergies    Lisinopril   Review of Systems Review of Systems  Constitutional: Negative.   HENT: Positive for dental problem and mouth sores.        Dental pain  Respiratory: Negative.   Cardiovascular: Negative.      Physical Exam Triage Vital Signs ED Triage Vitals  Enc Vitals Group     BP 05/28/19 1035 (!) 187/109     Pulse Rate 05/28/19 1035 83     Resp 05/28/19 1035 17     Temp 05/28/19 1035 98.7 F (37.1 C)     Temp Source 05/28/19 1035 Oral     SpO2 05/28/19 1035 97 %     Weight --      Height --      Head Circumference --      Peak Flow --      Pain Score 05/28/19 1041 7     Pain Loc --      Pain Edu? --      Excl. in [place]? --    No data found.  Updated Vital Signs BP (!) 187/109 (BP Location: Right Arm)   Pulse 83   Temp 98.7 F (37.1 C) (Oral)   Resp 17   SpO2 97%   Visual Acuity Right Eye Distance:   Left Eye Distance:   Bilateral Distance:    Right Eye Near:   Left Eye Near:    Bilateral Near:     Physical Exam Vitals and nursing note reviewed.  Constitutional:      General: She is not in acute distress.    Appearance: Normal appearance. She is normal weight. She is not ill-appearing, toxic-appearing or diaphoretic.  HENT:     Head: Normocephalic.     Right Ear: Tympanic membrane, ear canal and external ear normal. There is no impacted cerumen.     Left Ear: Tympanic membrane, ear canal and external ear normal. There is no impacted cerumen.     Mouth/Throat:     Lips: Pink.     Mouth: Mucous membranes are moist.     Dentition: Dental tenderness and dental abscesses present.     Tongue: No lesions.     Tonsils: No tonsillar exudate.      Comments: Dental pain, tenderness and abscess present Cardiovascular:     Rate and Rhythm: Normal rate and regular rhythm.     Pulses: Normal pulses.     Heart sounds: Normal heart sounds. No murmur. No friction rub. No gallop.   Pulmonary:     Effort: Pulmonary effort is normal. No respiratory distress.      Breath sounds: Normal breath sounds. No stridor. No wheezing, rhonchi or rales.  Chest:     Chest wall: No tenderness.  Neurological:  Mental Status: She is alert.      UC Treatments / Results  Labs (all labs ordered are listed, but only abnormal results are displayed) Labs Reviewed - No data to display  EKG   Radiology No results found.  Procedures Procedures (including critical care time)  Medications Ordered in UC Medications - No data to display  Initial Impression / Assessment and Plan / UC Course  I have reviewed the triage vital signs and the nursing notes.  Pertinent labs & imaging results that were available during my care of the patient were reviewed by me and considered in my medical decision making (see chart for details).    Patient is stable for discharge.  She was advised to follow-up with dentist.  Augmentin and chlorhexidine mouthwash were prescribed.  Diflucan was prescribed to prevent yeast infection.  Continue to take ibuprofen as needed for pain.   Final Clinical Impressions(s) / UC Diagnoses   Final diagnoses:  Pain, dental  Dental abscess     Discharge Instructions     Augmentin prescribed.  Take as directed and to completion Chlorhexidine mouthwash was prescribed Continue to take ibuprofen 800 mg as needed every 6-8 hours Recommend a soft diet  Maintain proper dental hygiene Follow up with dentist as soon as possible for further evaluation and treatment      ED Prescriptions    Medication Sig Dispense Auth. Provider   amoxicillin-clavulanate (AUGMENTIN) 875-125 MG tablet Take 1 tablet by mouth every 12 (twelve) hours. 14 tablet Aidon Klemens, Darrelyn Hillock, FNP   chlorhexidine (PERIDEX) 0.12 % solution Use as directed 15 mLs in the mouth or throat 2 (two) times daily. 120 mL Alixis Harmon S, FNP   fluconazole (DIFLUCAN) 150 MG tablet Take 1 tablet (150 mg total) by mouth daily. Take the second dose 3 days after the first dose if symptom  does not resolve 2 tablet Amonie Wisser, Darrelyn Hillock, FNP     PDMP not reviewed this encounter.   Emerson Monte, FNP 05/28/19 1102    Emerson Monte, FNP 05/28/19 1105

## 2019-05-28 NOTE — ED Triage Notes (Signed)
Pt has potential abscess under dental bridge on right side

## 2019-06-04 ENCOUNTER — Other Ambulatory Visit: Payer: Self-pay | Admitting: "Endocrinology

## 2019-06-08 ENCOUNTER — Other Ambulatory Visit (HOSPITAL_COMMUNITY): Payer: Self-pay | Admitting: Family Medicine

## 2019-06-08 DIAGNOSIS — Z1231 Encounter for screening mammogram for malignant neoplasm of breast: Secondary | ICD-10-CM

## 2019-06-11 ENCOUNTER — Other Ambulatory Visit: Payer: Self-pay | Admitting: "Endocrinology

## 2019-06-16 ENCOUNTER — Ambulatory Visit: Payer: Medicare Other | Admitting: Orthopedic Surgery

## 2019-06-16 ENCOUNTER — Other Ambulatory Visit: Payer: Self-pay

## 2019-06-16 VITALS — BP 154/86 | HR 76 | Temp 96.9°F | Ht 65.0 in | Wt 211.0 lb

## 2019-06-16 DIAGNOSIS — M65331 Trigger finger, right middle finger: Secondary | ICD-10-CM | POA: Diagnosis not present

## 2019-06-16 NOTE — Patient Instructions (Addendum)
You have received an injection of steroids into the joint. 15% of patients will have increased pain within the 24 hours postinjection.   This is transient and will go away.   We recommend that you use ice packs on the injection site for 20 minutes every 2 hours and extra strength Tylenol 2 tablets every 8 as needed until the pain resolves.  If you continue to have pain after taking the Tylenol and using the ice please call the office for further instructions.  Trigger Finger  Trigger finger, also called stenosing tenosynovitis,  is a condition that causes a finger to get stuck in a bent position. Each finger has a tendon, which is a tough, cord-like tissue that connects muscle to bone, and each tendon passes through a tunnel of tissue called a tendon sheath. To move your finger, your tendon needs to glide freely through the sheath. Trigger finger happens when the tendon or the sheath thickens, making it difficult to move your finger. Trigger finger can affect any finger or a thumb. It may affect more than one finger. Mild cases may clear up with rest and medicine. Severe cases require more treatment. What are the causes? Trigger finger is caused by a thickened finger tendon or tendon sheath. The cause of this thickening is not known. What increases the risk? The following factors may make you more likely to develop this condition:  Doing activities that require a strong grip.  Having rheumatoid arthritis, gout, or diabetes.  Being 40-60 years old.  Being female. What are the signs or symptoms? Symptoms of this condition include:  Pain when bending or straightening your finger.  Tenderness or swelling where your finger attaches to the palm of your hand.  A lump in the palm of your hand or on the inside of your finger.  Hearing a noise like a pop or a snap when you try to straighten your finger.  Feeling a catching or locking sensation when you try to straighten your finger.  Being  unable to straighten your finger. How is this diagnosed? This condition is diagnosed based on your symptoms and a physical exam. How is this treated? This condition may be treated by:  Resting your finger and avoiding activities that make symptoms worse.  Wearing a finger splint to keep your finger extended.  Taking NSAIDs, such as ibuprofen, to relieve pain and swelling.  Doing gentle exercises to stretch the finger as told by your health care provider.  Having medicine that reduces swelling and inflammation (steroids) injected into the tendon sheath. Injections may need to be repeated.  Having surgery to open the tendon sheath. This may be done if other treatments do not work and you cannot straighten your finger. You may need physical therapy after surgery. Follow these instructions at home: If you have a splint:  Wear the splint as told by your health care provider. Remove it only as told by your health care provider.  Loosen it if your fingers tingle, become numb, or turn cold and blue.  Keep it clean.  If the splint is not waterproof: ? Do not let it get wet. ? Cover it with a watertight covering when you take a bath or shower. Managing pain, stiffness, and swelling     If directed, apply heat to the affected area as often as told by your health care provider. Use the heat source that your health care provider recommends, such as a moist heat pack or a heating pad.  Place   a towel between your skin and the heat source.  Leave the heat on for 20-30 minutes.  Remove the heat if your skin turns bright red. This is especially important if you are unable to feel pain, heat, or cold. You may have a greater risk of getting burned. If directed, put ice on the painful area. To do this:  If you have a removable splint, remove it as told by your health care provider.  Put ice in a plastic bag.  Place a towel between your skin and the bag or between your splint and the  bag.  Leave the ice on for 20 minutes, 2-3 times a day.  Activity  Rest your finger as told by your health care provider. Avoid activities that make the pain worse.  Return to your normal activities as told by your health care provider. Ask your health care provider what activities are safe for you.  Do exercises as told by your health care provider.  Ask your health care provider when it is safe to drive if you have a splint on your hand. General instructions  Take over-the-counter and prescription medicines only as told by your health care provider.  Keep all follow-up visits as told by your health care provider. This is important. Contact a health care provider if:  Your symptoms are not improving with home care. Summary  Trigger finger, also called stenosing tenosynovitis, causes your finger to get stuck in a bent position. This can make it difficult and painful to straighten your finger.  This condition develops when a finger tendon or tendon sheath thickens.  Treatment may include resting your finger, wearing a splint, and taking medicines.  In severe cases, surgery to open the tendon sheath may be needed. This information is not intended to replace advice given to you by your health care provider. Make sure you discuss any questions you have with your health care provider. Document Revised: 06/08/2018 Document Reviewed: 06/08/2018 Elsevier Patient Education  2020 Else[employer] Inc.  

## 2019-06-16 NOTE — Progress Notes (Signed)
Chief Complaint  Patient presents with  . Hand Pain    Right middle trigger finger, referred by Salley Slaughter.    65 year old female with diabetes presents with a 18-month history of pain locking triggering right long finger unrelieved by 3 months of splinting.  Patient was referred by [facility]  Past Medical History:  Diagnosis Date  . Diabetes mellitus without complication ([place])   . Multiple sclerosis ([place])     Her self-reported system review reports fatigue memory loss depression weakness tingling dizziness excessive thirst and heartburn all other systems were negative  Past Surgical History:  Procedure Laterality Date  . BREAST BIOPSY Left   . BREAST EXCISIONAL BIOPSY Left    benign  . CESAREAN SECTION    . NASAL SEPTUM SURGERY     BP (!) 154/86   Pulse 76   Temp (!) 96.9 F (36.1 C)   Ht 5\' 5"  (1.651 m)   Wt 211 lb (95.7 kg)   BMI 35.11 kg/m   She is awake alert and oriented x3 mood and affect are normal  There are no gross abnormalities on appearance  She ambulates without any assistive devices  Right hand she has tenderness over the A1 pulley it is held in slight flexion flexor tendons are intact sensation and color are normal  Encounter Diagnosis  Name Primary?  . Trigger finger, right middle finger Yes    Brief discussion was entertained regarding treatment options including injection and surgery.  We advised against surgery and recommended injection which she agreed to  We injected the right long or middle finger  Trigger finger injection  Diagnosis trigger finger right long finger Procedure injection A1 pulley Medications lidocaine 1% 1 mL and Depo-Medrol 40 mg 1 mL Skin prep alcohol and ethyl chloride Verbal consent was obtained Timeout confirmed the injection site  After cleaning the skin with alcohol and anesthetizing the skin with ethyl chloride the A1 pulley was palpated and the injection was performed without  complication   Outside records were reviewed from [facility] patient is noted to be type 2 diabetes with diabetic neuropathy benign hypertension hyperlipidemia GERD multiple sclerosis ADHD fatty liver memory loss.  She is on insulin and Metformin

## 2019-06-17 ENCOUNTER — Ambulatory Visit: Payer: Medicare Other | Admitting: Orthopedic Surgery

## 2019-06-18 LAB — COMPLETE METABOLIC PANEL WITH GFR
AG Ratio: 1.9 (calc) (ref 1.0–2.5)
ALT: 51 U/L — ABNORMAL HIGH (ref 6–29)
AST: 54 U/L — ABNORMAL HIGH (ref 10–35)
Albumin: 4.5 g/dL (ref 3.6–5.1)
Alkaline phosphatase (APISO): 87 U/L (ref 37–153)
BUN: 14 mg/dL (ref 7–25)
CO2: 29 mmol/L (ref 20–32)
Calcium: 9.5 mg/dL (ref 8.6–10.4)
Chloride: 100 mmol/L (ref 98–110)
Creat: 0.66 mg/dL (ref 0.50–0.99)
GFR, Est African American: 107 mL/min/{1.73_m2} (ref >=60)
GFR, Est Non African American: 93 mL/min/{1.73_m2} (ref >=60)
Globulin: 2.4 g/dL (calc) (ref 1.9–3.7)
Glucose, Bld: 192 mg/dL — ABNORMAL HIGH (ref 65–99)
Potassium: 4.8 mmol/L (ref 3.5–5.3)
Sodium: 136 mmol/L (ref 135–146)
Total Bilirubin: 0.4 mg/dL (ref 0.2–1.2)
Total Protein: 6.9 g/dL (ref 6.1–8.1)

## 2019-06-18 LAB — LIPID PANEL
Cholesterol: 154 mg/dL (ref <200)
HDL: 37 mg/dL — ABNORMAL LOW (ref >=50)
LDL Cholesterol (Calc): 90 mg/dL (calc)
Non-HDL Cholesterol (Calc): 117 mg/dL (calc) (ref <130)
Total CHOL/HDL Ratio: 4.2 (calc) (ref <5.0)
Triglycerides: 174 mg/dL — ABNORMAL HIGH (ref <150)

## 2019-06-18 LAB — MICROALBUMIN / CREATININE URINE RATIO
Creatinine, Urine: 110 mg/dL (ref 20–275)
Microalb Creat Ratio: 29 mcg/mg creat (ref <30)
Microalb, Ur: 3.2 mg/dL

## 2019-06-18 LAB — T4, FREE: Free T4: 1.1 ng/dL (ref 0.8–1.8)

## 2019-06-18 LAB — TSH: TSH: 2.98 mIU/L (ref 0.40–4.50)

## 2019-06-23 ENCOUNTER — Other Ambulatory Visit: Payer: Self-pay

## 2019-06-23 ENCOUNTER — Ambulatory Visit (INDEPENDENT_AMBULATORY_CARE_PROVIDER_SITE_OTHER): Payer: Medicare Other | Admitting: "Endocrinology

## 2019-06-23 ENCOUNTER — Encounter: Payer: Self-pay | Admitting: "Endocrinology

## 2019-06-23 VITALS — BP 164/93 | HR 71 | Ht 65.0 in | Wt 212.0 lb

## 2019-06-23 DIAGNOSIS — E1165 Type 2 diabetes mellitus with hyperglycemia: Secondary | ICD-10-CM | POA: Diagnosis not present

## 2019-06-23 DIAGNOSIS — Z794 Long term (current) use of insulin: Secondary | ICD-10-CM

## 2019-06-23 DIAGNOSIS — IMO0002 Reserved for concepts with insufficient information to code with codable children: Secondary | ICD-10-CM

## 2019-06-23 DIAGNOSIS — E782 Mixed hyperlipidemia: Secondary | ICD-10-CM | POA: Diagnosis not present

## 2019-06-23 DIAGNOSIS — E118 Type 2 diabetes mellitus with unspecified complications: Secondary | ICD-10-CM | POA: Diagnosis not present

## 2019-06-23 DIAGNOSIS — I10 Essential (primary) hypertension: Secondary | ICD-10-CM | POA: Diagnosis not present

## 2019-06-23 LAB — POCT GLYCOSYLATED HEMOGLOBIN (HGB A1C): Hemoglobin A1C: 9.2 % — AB (ref 4.0–5.6)

## 2019-06-23 MED ORDER — GLIPIZIDE ER 5 MG PO TB24: 5.0000 mg | ORAL_TABLET | Freq: Every day | ORAL | 3 refills | Status: DC

## 2019-06-23 MED ORDER — TOUJEO MAX SOLOSTAR 300 UNIT/ML ~~LOC~~ SOPN: 90.0000 [IU] | PEN_INJECTOR | Freq: Every day | SUBCUTANEOUS | 2 refills | Status: DC

## 2019-06-23 NOTE — Patient Instructions (Signed)

## 2019-06-23 NOTE — Progress Notes (Signed)
06/23/2019   Endocrinology follow-up note   Subjective:    Patient ID: Melanie Schultz, female    DOB: 22-Feb-1955,    Past Medical History:  Diagnosis Date  . Diabetes mellitus without complication ([place])   . Multiple sclerosis ([place])    Past Surgical History:  Procedure Laterality Date  . BREAST BIOPSY Left   . BREAST EXCISIONAL BIOPSY Left    benign  . CESAREAN SECTION    . NASAL SEPTUM SURGERY     Social History   Socioeconomic History  . Marital status: Divorced    Spouse name: Not on file  . Number of children: Not on file  . Years of education: Not on file  . Highest education level: Not on file  Occupational History  . Not on file  Tobacco Use  . Smoking status: Former Research scientist (life sciences)  . Smokeless tobacco: Never Used  Substance and Sexual Activity  . Alcohol use: No    Alcohol/week: 0.0 standard drinks  . Drug use: No  . Sexual activity: Not on file  Other Topics Concern  . Not on file  Social History Narrative  . Not on file   Social Determinants of Health   Financial Resource Strain:   . Difficulty of Paying Living Expenses:   Food Insecurity:   . Worried About Charity fundraiser in the Last Year:   . Arboriculturist in the Last Year:   Transportation Needs:   . Film/video editor (Medical):   Marland Kitchen Lack of Transportation (Non-Medical):   Physical Activity:   . Days of Exercise per Week:   . Minutes of Exercise per Session:   Stress:   . Feeling of Stress :   Social Connections:   . Frequency of Communication with Friends and Family:   . Frequency of Social Gatherings with Friends and Family:   . Attends Religious Services:   . Active Member of Clubs or Organizations:   . Attends Archivist Meetings:   Marland Kitchen Marital Status:    Outpatient Encounter Medications as of 06/23/2019  Medication Sig  . Accu-Chek Softclix Lancets lancets 100 each by Other route 4 (four) times daily. Use as instructed  . atenolol (TENORMIN) 50 MG tablet Take 1 tablet  (50 mg total) by mouth 2 (two) times daily.  . B-D ULTRAFINE III SHORT PEN 31G X 8 MM MISC USE 1 PEN NEEDLE 4 TIMES DAILY  . Cholecalciferol (VITAMIN D3) 125 MCG (5000 UT) CAPS Take by mouth daily.  Marland Kitchen glipiZIDE (GLUCOTROL XL) 5 MG 24 hr tablet Take 1 tablet (5 mg total) by mouth daily with breakfast.  . glucose blood (ACCU-CHEK AVIVA PLUS) test strip TEST FOUR TIMES DAILY. E11.65  . HUMALOG KWIKPEN 100 UNIT/ML KwikPen Inject 0.2-0.26 mLs (20-26 Units total) into the skin 3 (three) times daily before meals.  . insulin glargine, 2 Unit Dial, (TOUJEO MAX SOLOSTAR) 300 UNIT/ML Solostar Pen Inject 90 Units into the skin at bedtime.  Marland Kitchen losartan (COZAAR) 25 MG tablet daily.  . metFORMIN (GLUCOPHAGE-XR) 500 MG 24 hr tablet TAKE 2 TABLETS BY MOUTH ONCE DAILY WITH BREAKFAST  . methylphenidate (RITALIN) 10 MG tablet Take 20 mg by mouth daily. Take 2 tablets four times daily  . omeprazole (PRILOSEC) 20 MG capsule Take 20 mg by mouth daily.  . rosuvastatin (CRESTOR) 20 MG tablet Take 20 mg by mouth daily.  . Teriflunomide 14 MG TABS Take by mouth.  . [DISCONTINUED] amoxicillin-clavulanate (AUGMENTIN) 875-125 MG tablet Take 1 tablet  by mouth every 12 (twelve) hours.  . [DISCONTINUED] chlorhexidine (PERIDEX) 0.12 % solution Use as directed 15 mLs in the mouth or throat 2 (two) times daily.  . [DISCONTINUED] fluconazole (DIFLUCAN) 150 MG tablet Take 1 tablet (150 mg total) by mouth daily. Take the second dose 3 days after the first dose if symptom does not resolve  . [DISCONTINUED] Insulin Glargine, 2 Unit Dial, (TOUJEO MAX SOLOSTAR) 300 UNIT/ML SOPN Inject 80 Units into the skin at bedtime.   No facility-administered encounter medications on file as of 06/23/2019.   ALLERGIES: Allergies  Allergen Reactions  . Lisinopril    VACCINATION STATUS:  There is no immunization history on file for this patient.  Diabetes She presents for her follow-up diabetic visit. She has type 2 diabetes mellitus. Onset time:  She was diagnosed at approximate age of 64 years. Her disease course has been worsening. There are no hypoglycemic associated symptoms. Pertinent negatives for hypoglycemia include no confusion, headaches, pallor or seizures. Pertinent negatives for diabetes include no chest pain, no fatigue, no polydipsia, no polyphagia and no polyuria. There are no hypoglycemic complications. Symptoms are improving. There are no diabetic complications. Risk factors for coronary artery disease include dyslipidemia, hypertension and obesity. Current diabetic treatment includes insulin injections and oral agent (dual therapy). She is compliant with treatment most of the time. Her weight is increasing steadily. She is following a generally unhealthy diet. She has had a previous visit with a dietitian. She participates in exercise intermittently. Her home blood glucose trend is fluctuating minimally. Her breakfast blood glucose range is generally 180-200 mg/dl. Her lunch blood glucose range is generally 180-200 mg/dl. Her dinner blood glucose range is generally 180-200 mg/dl. Her bedtime blood glucose range is generally 180-200 mg/dl. Her overall blood glucose range is 180-200 mg/dl. (She presents with significantly above target glycemic profile, A1c at point-of-care was 9.2%.  She did not document hypoglycemia.) An ACE inhibitor/angiotensin II receptor blocker is being taken. Eye exam is current.  Hyperlipidemia This is a chronic problem. The current episode started more than 1 year ago. Exacerbating diseases include diabetes and obesity. Pertinent negatives include no chest pain, myalgias or shortness of breath. Current antihyperlipidemic treatment includes statins. Risk factors for coronary artery disease include diabetes mellitus, dyslipidemia, a sedentary lifestyle and post-menopausal.  Hypertension This is a chronic problem. The current episode started more than 1 year ago. Pertinent negatives include no chest pain,  headaches, palpitations or shortness of breath. Risk factors for coronary artery disease include diabetes mellitus, dyslipidemia, post-menopausal state, sedentary lifestyle and smoking/tobacco exposure. Past treatments include ACE inhibitors.     Review of systems  Constitutional: + Minimally fluctuating body weight,  current  Body mass index is 35.28 kg/m. , no fatigue, no subjective hyperthermia, no subjective hypothermia Eyes: no blurry vision, no xerophthalmia ENT: no sore throat, no nodules palpated in throat, no dysphagia/odynophagia, no hoarseness Cardiovascular: no Chest Pain, no Shortness of Breath, no palpitations, no leg swelling Respiratory: no cough, no shortness of breath Gastrointestinal: no Nausea/Vomiting/Diarhhea Musculoskeletal: no muscle/joint aches Skin: no rashes, no hyperemia Neurological: no tremors, no numbness, no tingling, no dizziness Psychiatric: no depression, no anxiety    Objective:    BP (!) 164/93   Pulse 71   Ht 5\' 5"  (1.651 m)   Wt 212 lb (96.2 kg)   BMI 35.28 kg/m   Wt Readings from Last 3 Encounters:  06/23/19 212 lb (96.2 kg)  06/16/19 211 lb (95.7 kg)  02/18/19 214 lb (97.1 kg)  Physical Exam- Limited  Constitutional:  Body mass index is 35.28 kg/m. , not in acute distress, normal state of mind Eyes:  EOMI, no exophthalmos Neck: Supple Thyroid: No gross goiter Respiratory: Adequate breathing efforts Musculoskeletal: no gross deformities, strength intact in all four extremities, no gross restriction of joint movements Skin:  no rashes, no hyperemia Neurological: no tremor with outstretched hands,    Results for orders placed or performed in visit on 06/23/19  HgB A1c  Result Value Ref Range   Hemoglobin A1C 9.2 (A) 4.0 - 5.6 %   HbA1c POC (<> result, manual entry)     HbA1c, POC (prediabetic range)     HbA1c, POC (controlled diabetic range)     Lipid Panel     Component Value Date/Time   CHOL 154 06/17/2019 0818    TRIG 174 (H) 06/17/2019 0818   HDL 37 (L) 06/17/2019 0818   CHOLHDL 4.2 06/17/2019 0818   VLDL 57 (H) 09/04/2016 1036   LDLCALC 90 06/17/2019 0818   Recent Results (from the past 2160 hour(s))  I-STAT creatinine     Status: None   Collection Time: 05/06/19 10:24 AM  Result Value Ref Range   Creatinine, Ser 0.70 0.44 - 1.00 mg/dL  COMPLETE METABOLIC PANEL WITH GFR     Status: Abnormal   Collection Time: 06/17/19  8:18 AM  Result Value Ref Range   Glucose, Bld 192 (H) 65 - 99 mg/dL    Comment: .            Fasting reference interval . For someone without known diabetes, a glucose value >125 mg/dL indicates that they may have diabetes and this should be confirmed with a follow-up test. .    BUN 14 7 - 25 mg/dL   Creat 0.66 0.50 - 0.99 mg/dL    Comment: For patients >5 years of age, the reference limit for Creatinine is approximately 13% higher for people identified as African-American. .    GFR, Est Non African American 93 > OR = 60 mL/min/1.23m2   GFR, Est African American 107 > OR = 60 mL/min/1.21m2   BUN/Creatinine Ratio NOT APPLICABLE 6 - 22 (calc)   Sodium 136 135 - 146 mmol/L   Potassium 4.8 3.5 - 5.3 mmol/L   Chloride 100 98 - 110 mmol/L   CO2 29 20 - 32 mmol/L   Calcium 9.5 8.6 - 10.4 mg/dL   Total Protein 6.9 6.1 - 8.1 g/dL   Albumin 4.5 3.6 - 5.1 g/dL   Globulin 2.4 1.9 - 3.7 g/dL (calc)   AG Ratio 1.9 1.0 - 2.5 (calc)   Total Bilirubin 0.4 0.2 - 1.2 mg/dL   Alkaline phosphatase (APISO) 87 37 - 153 U/L   AST 54 (H) 10 - 35 U/L   ALT 51 (H) 6 - 29 U/L  Microalbumin/Creatinine Ratio, Urine     Status: None   Collection Time: 06/17/19  8:18 AM  Result Value Ref Range   Creatinine, Urine 110 20 - 275 mg/dL   Microalb, Ur 3.2 mg/dL    Comment: Reference Range Not established    Microalb Creat Ratio 29 <30 mcg/mg creat    Comment: . The ADA defines abnormalities in albumin excretion as follows: Marland Kitchen Category         Result (mcg/mg creatinine) . Normal                     <30 Microalbuminuria         30-299  Clinical albuminuria   > OR = 300 . The ADA recommends that at least two of three specimens collected within a 3-6 month period be abnormal before considering a patient to be within a diagnostic category.   Lipid Panel     Status: Abnormal   Collection Time: 06/17/19  8:18 AM  Result Value Ref Range   Cholesterol 154 <200 mg/dL   HDL 37 (L) > OR = 50 mg/dL   Triglycerides 174 (H) <150 mg/dL   LDL Cholesterol (Calc) 90 mg/dL (calc)    Comment: Reference range: <100 . Desirable range <100 mg/dL for primary prevention;   <70 mg/dL for patients with CHD or diabetic patients  with > or = 2 CHD risk factors. Marland Kitchen LDL-C is now calculated using the Martin-Hopkins  calculation, which is a validated novel method providing  better accuracy than the Friedewald equation in the  estimation of LDL-C.  Cresenciano Genre et al. Annamaria Helling. [id number]): 2061-2068  (http://education.[facility].com/faq/FAQ164)    Total CHOL/HDL Ratio 4.2 <5.0 (calc)   Non-HDL Cholesterol (Calc) 117 <130 mg/dL (calc)    Comment: For patients with diabetes plus 1 major ASCVD risk  factor, treating to a non-HDL-C goal of <100 mg/dL  (LDL-C of <70 mg/dL) is considered a therapeutic  option.   TSH SOLSTAS     Status: None   Collection Time: 06/17/19  8:18 AM  Result Value Ref Range   TSH 2.98 0.40 - 4.50 mIU/L  T4, free SOLSTAS     Status: None   Collection Time: 06/17/19  8:18 AM  Result Value Ref Range   Free T4 1.1 0.8 - 1.8 ng/dL  HgB A1c     Status: Abnormal   Collection Time: 06/23/19 10:04 AM  Result Value Ref Range   Hemoglobin A1C 9.2 (A) 4.0 - 5.6 %   HbA1c POC (<> result, manual entry)     HbA1c, POC (prediabetic range)     HbA1c, POC (controlled diabetic range)       Assessment & Plan:   1. Uncontrolled type 2 diabetes mellitus with complication, with long-term current use of insulin ([place])  She presents with significantly above target glycemic  profile was fasting and postprandial.  Her point-of-care A1c is 9.2%, unchanged from her last visit A1c of 9.4%.     Recent labs reviewed. - Patient remains at a high risk for more acute and chronic complications of diabetes which include CAD, CVA, CKD, retinopathy, and neuropathy. These are all discussed in detail with the patient.  - I have re-counseled the patient on diet management and weight loss  by adopting a carbohydrate restricted / protein rich  Diet. - Patient is advised to stick to a routine mealtimes to eat 3 meals  a day and avoid unnecessary snacks ( to snack only to correct hypoglycemia).  - she  admits there is a room for improvement in her diet and drink choices. -  Suggestion is made for her to avoid simple carbohydrates  from her diet including Cakes, Sweet Desserts / Pastries, Ice Cream, Soda (diet and regular), Sweet Tea, Candies, Chips, Cookies, Sweet Pastries,  Store Bought Juices, Alcohol in Excess of  1-2 drinks a day, Artificial Sweeteners, Coffee Creamer, and "Sugar-free" Products. This will help patient to have stable blood glucose profile and potentially avoid unintended weight gain.  - I have approached patient with the following individualized plan to manage diabetes and patient agrees.   -Based on her presentation with improved and near target glycemic profile, she  will continue to benefit from intensive treatment with higher dose of basal/bolus insulin.    -She is advised to increase Toujeo to 90 units nightly, continue Humalog  20-26  units 3 times daily AC for pre-meal blood glucose readings above 90 mg. She is urged to continue to monitor blood glucose strictly 4 times a day- before meals and at bedtime.    -Patient is encouraged to call clinic for blood glucose levels less than 70 or above 300 mg /dl.    -She is advised to continue metformin 1000 mg ER p.o. daily.  She will benefit from low-dose glipizide treatment.  I discussed and added glipizide 5 mg XL at  breakfast.  Side effects and precautions discussed with her.  - Patient specific target  for A1c; LDL, HDL, Triglycerides, and  Waist Circumference were discussed in detail.  2) BP/HTN:  Her blood pressure is not controlled to target.  She is advised to continue her current blood pressure medications including losartan 25 mg p.o. daily.  She will be considered for higher dose of losartan during her next visit.   3) Lipids/HPL: She is advised to continue Lipitor 80 mg p.o. nightly.  Her recent lipid panel showed controlled LDL at 59.  She he is advised to continue Crestor 20 mg p.o. nightly.  Side effects and precautions discussed with her.    4)  Weight/Diet: Her BMI 35.2-she is a candidate for modest weight loss.  CDE consult in progress, exercise, and carbohydrates information provided.  5) Chronic Care/Health Maintenance:  -Patient  on losartan 25 mg p.o. daily, Crestor 20 mg p.o. nightly  and encouraged to continue to follow up with Ophthalmology, Podiatrist at least yearly or according to recommendations, and advised to  stay away from smoking. I have recommended yearly flu vaccine and pneumonia vaccination at least every 5 years; moderate intensity exercise for up to 150 minutes weekly; and  sleep for at least 7 hours a day.  -She has mild to moderate dyspnea on exertion, may benefit from stress test via cardiology evaluation.  This will be facilitated by her PCP during her next visit in 1 week.  I advised patient to maintain close follow up with her PCP for primary care needs.  - Time spent on this patient care encounter:  35 min, of which > 50% was spent in  counseling and the rest reviewing her blood glucose logs , discussing her hypoglycemia and hyperglycemia episodes, reviewing her current and  previous labs / studies  ( including abstraction from other facilities) and medications  doses and developing a  long term treatment plan and documenting her care.   Please refer to Patient  Instructions for Blood Glucose Monitoring and Insulin/Medications Dosing Guide"  in media tab for additional information. Please  also refer to " Patient Self Inventory" in the Media  tab for reviewed elements of pertinent patient history.  Royetta Crochet Poznanski participated in the discussions, expressed understanding, and voiced agreement with the above plans.  All questions were answered to her satisfaction. she is encouraged to contact clinic should she have any questions or concerns prior to her return visit.    Follow up plan: Return in about 3 months (around 09/23/2019) for Bring Meter and Logs- A1c in Office.  Glade Lloyd, MD Phone: [phone]  Fax: [phone]   -  This note was partially dictated with voice recognition software. Similar sounding words can be transcribed inadequately or may not  be corrected upon review.  06/23/2019, 3:16  PM

## 2019-06-25 ENCOUNTER — Other Ambulatory Visit: Payer: Self-pay

## 2019-06-25 ENCOUNTER — Ambulatory Visit: Payer: Medicare Other | Attending: Family Medicine | Admitting: Neurology

## 2019-06-25 DIAGNOSIS — Z794 Long term (current) use of insulin: Secondary | ICD-10-CM | POA: Diagnosis not present

## 2019-06-25 DIAGNOSIS — G4710 Hypersomnia, unspecified: Secondary | ICD-10-CM | POA: Diagnosis not present

## 2019-06-25 DIAGNOSIS — Z79899 Other long term (current) drug therapy: Secondary | ICD-10-CM | POA: Insufficient documentation

## 2019-06-25 DIAGNOSIS — R5383 Other fatigue: Secondary | ICD-10-CM

## 2019-06-25 DIAGNOSIS — G4730 Sleep apnea, unspecified: Secondary | ICD-10-CM

## 2019-06-25 DIAGNOSIS — R0683 Snoring: Secondary | ICD-10-CM | POA: Diagnosis present

## 2019-06-25 DIAGNOSIS — G4733 Obstructive sleep apnea (adult) (pediatric): Secondary | ICD-10-CM | POA: Insufficient documentation

## 2019-06-30 ENCOUNTER — Other Ambulatory Visit: Payer: Self-pay

## 2019-06-30 ENCOUNTER — Ambulatory Visit (HOSPITAL_COMMUNITY)
Admission: RE | Admit: 2019-06-30 | Discharge: 2019-06-30 | Disposition: A | Discharge status: Home / Self Care | Payer: Medicare Other | Source: Ambulatory Visit | Attending: Family Medicine | Admitting: Family Medicine

## 2019-06-30 DIAGNOSIS — Z1231 Encounter for screening mammogram for malignant neoplasm of breast: Secondary | ICD-10-CM | POA: Diagnosis not present

## 2019-07-09 ENCOUNTER — Ambulatory Visit (INDEPENDENT_AMBULATORY_CARE_PROVIDER_SITE_OTHER): Payer: Medicare Other | Admitting: Orthopedic Surgery

## 2019-07-09 ENCOUNTER — Encounter: Payer: Self-pay | Admitting: Orthopedic Surgery

## 2019-07-09 ENCOUNTER — Other Ambulatory Visit: Payer: Self-pay

## 2019-07-09 VITALS — BP 147/91 | HR 76 | Ht 65.0 in | Wt 213.0 lb

## 2019-07-09 DIAGNOSIS — M65331 Trigger finger, right middle finger: Secondary | ICD-10-CM

## 2019-07-09 NOTE — Progress Notes (Signed)
Chief Complaint  Patient presents with  . Hand Pain    R/ middle finger doing much better   Encounter Diagnosis  Name Primary?  . Trigger finger, right middle finger Yes     64 year old female with diabetes 53-month history of triggering of the right long finger status post injection.  The injection worked well patient has no pain in the palm occasional catching no locking  Exam shows no tenderness over the A1 pulley full range of motion of the hand with no locking catching or triggering  Recommend routine follow-up as needed if the hand catches or locks or becomes sore

## 2019-07-13 NOTE — Procedures (Signed)
   [place] A. Merlene Laughter, MD     [email or web address]             HOME SLEEP TEST LOCATION: [facility]    Patient Name: Melanie Schultz, Melanie Schultz Date: 06/25/2019 Gender: Female D.O.B: [birth year] Age (years): 89 Referring Provider: [place] MD Height (inches): 65 Interpreting Physician: Phillips Odor MD, ABSM Weight (lbs): 212 RPSGT: Rosebud Poles BMI: 35 MRN: [id number] Neck Size: CLINICAL INFORMATION Sleep Study Type: HST     Indication for sleep study: N/A     Epworth Sleepiness Score: NA  SLEEP STUDY TECHNIQUE A multi-channel overnight portable sleep study was performed. The channels recorded were: nasal airflow, thoracic respiratory movement, and oxygen saturation with a pulse oximetry. Snoring was also monitored.  MEDICATIONS Patient self administered medications include: N/A.  Current Outpatient Medications:  .  Accu-Chek Softclix Lancets lancets, 100 each by Other route 4 (four) times daily. Use as instructed, Disp: 200 each, Rfl: 3 .  atenolol (TENORMIN) 50 MG tablet, Take 1 tablet (50 mg total) by mouth 2 (two) times daily., Disp: 180 tablet, Rfl: 0 .  B-D ULTRAFINE III SHORT PEN 31G X 8 MM MISC, USE 1 PEN NEEDLE 4 TIMES DAILY, Disp: 150 each, Rfl: 1 .  Cholecalciferol (VITAMIN D3) 125 MCG (5000 UT) CAPS, Take by mouth daily., Disp: , Rfl:  .  glipiZIDE (GLUCOTROL XL) 5 MG 24 hr tablet, Take 1 tablet (5 mg total) by mouth daily with breakfast., Disp: 30 tablet, Rfl: 3 .  glucose blood (ACCU-CHEK AVIVA PLUS) test strip, TEST FOUR TIMES DAILY. E11.65, Disp: 100 strip, Rfl: 0 .  HUMALOG KWIKPEN 100 UNIT/ML KwikPen, Inject 0.2-0.26 mLs (20-26 Units total) into the skin 3 (three) times daily before meals., Disp: 10 pen, Rfl: 2 .  hydrochlorothiazide (HYDRODIURIL) 12.5 MG tablet, Take 12.5 mg by mouth daily., Disp: , Rfl:  .  insulin glargine, 2 Unit Dial, (TOUJEO MAX SOLOSTAR) 300 UNIT/ML Solostar Pen, Inject 90 Units into the skin at  bedtime., Disp: 6 pen, Rfl: 2 .  losartan (COZAAR) 25 MG tablet, daily., Disp: , Rfl: 1 .  metFORMIN (GLUCOPHAGE-XR) 500 MG 24 hr tablet, TAKE 2 TABLETS BY MOUTH ONCE DAILY WITH BREAKFAST, Disp: 180 tablet, Rfl: 0 .  methylphenidate (RITALIN) 10 MG tablet, Take 20 mg by mouth daily. Take 2 tablets four times daily, Disp: , Rfl:  .  omeprazole (PRILOSEC) 20 MG capsule, Take 20 mg by mouth daily., Disp: , Rfl:  .  rosuvastatin (CRESTOR) 20 MG tablet, Take 20 mg by mouth daily., Disp: , Rfl:  .  Teriflunomide 14 MG TABS, Take by mouth., Disp: , Rfl:    SLEEP ARCHITECTURE Patient was studied for 519.9 minutes. The sleep efficiency was 91.2 % and the patient was supine for 21.3%. The arousal index was 0.0 per hour.  RESPIRATORY PARAMETERS The overall AHI was 24.1 per hour, with a central apnea index of 2.1 per hour.  The oxygen nadir was 86% during sleep.     CARDIAC DATA Mean heart rate during sleep was 80.9 bpm.  IMPRESSIONS Moderate obstructive sleep apnea occurred during this study (AHI = 24.1/h).  AutoPap 8-12 is recommended.   Delano Metz, MD Diplomate, American Board of Sleep Medicine.  ELECTRONICALLY SIGNED ON:  07/13/2019, 10:38 AM [facility] PH: [phone]   FX: [phone] [place]

## 2019-07-26 ENCOUNTER — Other Ambulatory Visit: Payer: Self-pay | Admitting: "Endocrinology

## 2019-07-26 DIAGNOSIS — IMO0002 Reserved for concepts with insufficient information to code with codable children: Secondary | ICD-10-CM

## 2019-08-13 ENCOUNTER — Other Ambulatory Visit: Payer: Self-pay | Admitting: "Endocrinology

## 2019-09-06 DIAGNOSIS — R5383 Other fatigue: Secondary | ICD-10-CM | POA: Insufficient documentation

## 2019-09-07 ENCOUNTER — Other Ambulatory Visit: Payer: Self-pay | Admitting: "Endocrinology

## 2019-09-08 ENCOUNTER — Other Ambulatory Visit: Payer: Self-pay | Admitting: "Endocrinology

## 2019-09-08 DIAGNOSIS — IMO0002 Reserved for concepts with insufficient information to code with codable children: Secondary | ICD-10-CM

## 2019-09-15 ENCOUNTER — Other Ambulatory Visit: Payer: Self-pay | Admitting: "Endocrinology

## 2019-09-18 ENCOUNTER — Other Ambulatory Visit: Payer: Self-pay | Admitting: "Endocrinology

## 2019-09-23 ENCOUNTER — Telehealth: Payer: Self-pay | Admitting: "Endocrinology

## 2019-09-23 DIAGNOSIS — IMO0002 Reserved for concepts with insufficient information to code with codable children: Secondary | ICD-10-CM

## 2019-09-23 DIAGNOSIS — E1165 Type 2 diabetes mellitus with hyperglycemia: Secondary | ICD-10-CM

## 2019-09-23 MED ORDER — GLIPIZIDE ER 5 MG PO TB24: 5.0000 mg | ORAL_TABLET | Freq: Every day | ORAL | 0 refills | Status: DC

## 2019-09-23 NOTE — Telephone Encounter (Signed)
Pt said she is having a hard time getting her glipiZIDE (GLUCOTROL XL) 5 MG 24 hr tablet refilled. Please advise.

## 2019-09-23 NOTE — Telephone Encounter (Signed)
Sent Rx refill in. Pt made aware.

## 2019-09-27 ENCOUNTER — Ambulatory Visit: Payer: Medicare Other | Admitting: Nurse Practitioner

## 2019-09-27 ENCOUNTER — Encounter: Payer: Self-pay | Admitting: Nurse Practitioner

## 2019-09-27 VITALS — BP 112/72 | HR 78 | Ht 65.0 in | Wt 216.0 lb

## 2019-09-27 DIAGNOSIS — Z794 Long term (current) use of insulin: Secondary | ICD-10-CM

## 2019-09-27 DIAGNOSIS — E118 Type 2 diabetes mellitus with unspecified complications: Secondary | ICD-10-CM

## 2019-09-27 DIAGNOSIS — E1165 Type 2 diabetes mellitus with hyperglycemia: Secondary | ICD-10-CM | POA: Diagnosis not present

## 2019-09-27 DIAGNOSIS — E782 Mixed hyperlipidemia: Secondary | ICD-10-CM

## 2019-09-27 DIAGNOSIS — I10 Essential (primary) hypertension: Secondary | ICD-10-CM

## 2019-09-27 DIAGNOSIS — IMO0002 Reserved for concepts with insufficient information to code with codable children: Secondary | ICD-10-CM

## 2019-09-27 LAB — POCT GLYCOSYLATED HEMOGLOBIN (HGB A1C): Hemoglobin A1C: 8.3 % — AB (ref 4.0–5.6)

## 2019-09-27 MED ORDER — TOUJEO MAX SOLOSTAR 300 UNIT/ML ~~LOC~~ SOPN: 100.0000 [IU] | PEN_INJECTOR | Freq: Every day | SUBCUTANEOUS | 3 refills | Status: DC

## 2019-09-27 NOTE — Patient Instructions (Signed)

## 2019-09-27 NOTE — Progress Notes (Signed)
09/27/2019   Endocrinology follow-up note   Subjective:    Patient ID: Melanie Schultz, female    DOB: 01-May-1955,  Melanie Schultz presents today for follow up of uncontrolled type 2 diabetes, hypertension, and hyperlipidemia.  Past Medical History:  Diagnosis Date   Diabetes mellitus without complication ([place])    Multiple sclerosis ([place])    Past Surgical History:  Procedure Laterality Date   BREAST BIOPSY Left    BREAST EXCISIONAL BIOPSY Left    benign   CESAREAN SECTION     NASAL SEPTUM SURGERY     Social History   Socioeconomic History   Marital status: Divorced    Spouse name: Not on file   Number of children: Not on file   Years of education: Not on file   Highest education level: Not on file  Occupational History   Not on file  Tobacco Use   Smoking status: Former Smoker   Smokeless tobacco: Never Used  Scientific laboratory technician Use: Never used  Substance and Sexual Activity   Alcohol use: No    Alcohol/week: 0.0 standard drinks   Drug use: No   Sexual activity: Not on file  Other Topics Concern   Not on file  Social History Narrative   Not on file   Social Determinants of Health   Financial Resource Strain:    Difficulty of Paying Living Expenses: Not on file  Food Insecurity:    Worried About Charity fundraiser in the Last Year: Not on file   [employer] of Food in the Last Year: Not on file  Transportation Needs:    Lack of Transportation (Medical): Not on file   Lack of Transportation (Non-Medical): Not on file  Physical Activity:    Days of Exercise per Week: Not on file   Minutes of Exercise per Session: Not on file  Stress:    Feeling of Stress : Not on file  Social Connections:    Frequency of Communication with Friends and Family: Not on file   Frequency of Social Gatherings with Friends and Family: Not on file   Attends Religious Services: Not on file   Active Member of Clubs or Organizations: Not on file    Attends Club or Organization Meetings: Not on file   Marital Status: Not on file   Outpatient Encounter Medications as of 09/27/2019  Medication Sig   Accu-Chek Softclix Lancets lancets 100 each by Other route 4 (four) times daily. Use as instructed   ARIPiprazole (ABILIFY) 2 MG tablet Take 2 mg by mouth daily.   atenolol (TENORMIN) 50 MG tablet Take 1 tablet (50 mg total) by mouth 2 (two) times daily.   B-D ULTRAFINE III SHORT PEN 31G X 8 MM MISC USE 1  NEEDLE 4 TIMES DAILY   Cholecalciferol (VITAMIN D3) 125 MCG (5000 UT) CAPS Take by mouth daily.   diclofenac Sodium (VOLTAREN) 1 % GEL SMARTSIG:2 Gram(s) Topical Every 6-8 Hours   FLUoxetine (PROZAC) 40 MG capsule Take 40 mg by mouth at bedtime.   folic acid-vitamin b complex-vitamin c-selenium-zinc (DIALYVITE) 3 MG TABS tablet Take by mouth.   glipiZIDE (GLUCOTROL XL) 5 MG 24 hr tablet Take 1 tablet (5 mg total) by mouth daily with breakfast.   glucose blood (ACCU-CHEK AVIVA PLUS) test strip TEST 4 TIMES DAILY   HUMALOG KWIKPEN 100 UNIT/ML KwikPen INJECT 20 TO 26 UNITS SUBCUTANEOUSLY THREE TIMES DAILY BEFORE MEAL(S)   hydrochlorothiazide (HYDRODIURIL) 12.5 MG tablet Take 12.5 mg  by mouth daily.   ibuprofen (ADVIL) 600 MG tablet Take 600 mg by mouth 4 (four) times daily as needed.   insulin glargine, 2 Unit Dial, (TOUJEO MAX SOLOSTAR) 300 UNIT/ML Solostar Pen Inject 90 Units into the skin at bedtime.   Insulin Glargine-Lixisenatide (SOLIQUA) 100-33 UNT-MCG/ML SOPN    losartan (COZAAR) 50 MG tablet Take 50 mg by mouth daily.   metFORMIN (GLUCOPHAGE-XR) 500 MG 24 hr tablet TAKE 2 TABLETS BY MOUTH ONCE DAILY WITH BREAKFAST   methylphenidate (RITALIN) 10 MG tablet Take 20 mg by mouth daily. Take 2 tablets four times daily   omeprazole (PRILOSEC) 20 MG capsule Take 20 mg by mouth daily.   rosuvastatin (CRESTOR) 20 MG tablet Take 20 mg by mouth daily.   Teriflunomide 14 MG TABS Take by mouth.   [DISCONTINUED] losartan  (COZAAR) 25 MG tablet daily.   No facility-administered encounter medications on file as of 09/27/2019.   ALLERGIES: Allergies  Allergen Reactions   Lisinopril    VACCINATION STATUS:  There is no immunization history on file for this patient.  Diabetes She presents for her follow-up diabetic visit. She has type 2 diabetes mellitus. Onset time: She was diagnosed at approximate age of 7 years. Her disease course has been improving. There are no hypoglycemic associated symptoms. Pertinent negatives for hypoglycemia include no confusion, headaches, pallor or seizures. Pertinent negatives for diabetes include no chest pain, no fatigue, no polydipsia, no polyphagia and no polyuria. There are no hypoglycemic complications. Symptoms are improving. There are no diabetic complications. Risk factors for coronary artery disease include dyslipidemia, hypertension, obesity, sedentary lifestyle and diabetes mellitus. Current diabetic treatment includes insulin injections and oral agent (dual therapy). She is compliant with treatment most of the time. Her weight is increasing steadily. She is following a generally unhealthy diet. When asked about meal planning, she reported none. She has had a previous visit with a dietitian. She participates in exercise intermittently. Her home blood glucose trend is fluctuating minimally. Her breakfast blood glucose range is generally 140-180 mg/dl. Her lunch blood glucose range is generally 180-200 mg/dl. Her dinner blood glucose range is generally 180-200 mg/dl. Her bedtime blood glucose range is generally >200 mg/dl. (She presents today with her meter and logs showing improved and near target fasting and postprandial glycemic profile.  Her point-of-care A1c today is 8.3%, improving from last visit at 9.2%.  She denies any hypoglycemia.) An ACE inhibitor/angiotensin II receptor blocker is being taken. She does not see a podiatrist.Eye exam is current.  Hyperlipidemia This is a  chronic problem. The current episode started more than 1 year ago. The problem is uncontrolled. Recent lipid tests were reviewed and are variable. Exacerbating diseases include diabetes and obesity. Factors aggravating her hyperlipidemia include fatty foods and beta blockers. Pertinent negatives include no chest pain, myalgias or shortness of breath. Current antihyperlipidemic treatment includes statins. The current treatment provides mild improvement of lipids. Compliance problems include adherence to diet.  Risk factors for coronary artery disease include diabetes mellitus, dyslipidemia, a sedentary lifestyle, post-menopausal and obesity.  Hypertension This is a chronic problem. The current episode started more than 1 year ago. The problem has been gradually improving since onset. The problem is controlled. Pertinent negatives include no chest pain, headaches, palpitations or shortness of breath. There are no associated agents to hypertension. Risk factors for coronary artery disease include diabetes mellitus, dyslipidemia, post-menopausal state, sedentary lifestyle and smoking/tobacco exposure. Past treatments include ACE inhibitors, angiotensin blockers, beta blockers and diuretics. The current treatment  provides mild improvement. Compliance problems include diet.      Review of systems  Constitutional: + Minimally fluctuating body weight,  current  Body mass index is 35.94 kg/m. , no fatigue, no subjective hyperthermia, no subjective hypothermia Eyes: no blurry vision, no xerophthalmia ENT: no sore throat, no nodules palpated in throat, no dysphagia/odynophagia, no hoarseness Cardiovascular: no Chest Pain, no Shortness of Breath, no palpitations, no leg swelling Respiratory: no cough, no shortness of breath Gastrointestinal: no Nausea/Vomiting/Diarhhea Musculoskeletal: no muscle/joint aches Skin: no rashes, no hyperemia Neurological: no tremors, no numbness, no tingling, no  dizziness Psychiatric: no depression, no anxiety    Objective:    BP 112/72 (BP Location: Left Arm, Patient Position: Sitting)    Pulse 78    Ht 5\' 5"  (1.651 m)    Wt 216 lb (98 kg)    BMI 35.94 kg/m   Wt Readings from Last 3 Encounters:  09/27/19 216 lb (98 kg)  07/09/19 213 lb (96.6 kg)  06/23/19 212 lb (96.2 kg)    BP Readings from Last 3 Encounters:  09/27/19 112/72  07/09/19 (!) 147/91  06/23/19 (!) 164/93      Physical Exam- Limited  Constitutional:  Body mass index is 35.94 kg/m. , not in acute distress, normal state of mind Eyes:  EOMI, no exophthalmos Neck: Supple Thyroid: No gross goiter Cardiovascular: RRR, no murmers, rubs, or gallops, no edema Respiratory: Adequate breathing efforts, no crackles, rales, rhonchi, or wheezing Musculoskeletal: no gross deformities, strength intact in all four extremities, no gross restriction of joint movements Skin:  no rashes, no hyperemia Neurological: no tremor with outstretched hands   Foot exam:   No rashes, ulcers, cuts, calluses, onychodystrophy.   Good pulses bilat.  Good sensation to 10 g monofilament bilat.    Results for orders placed or performed in visit on 06/23/19  HgB A1c  Result Value Ref Range   Hemoglobin A1C 9.2 (A) 4.0 - 5.6 %   HbA1c POC (<> result, manual entry)     HbA1c, POC (prediabetic range)     HbA1c, POC (controlled diabetic range)     Lipid Panel     Component Value Date/Time   CHOL 154 06/17/2019 0818   TRIG 174 (H) 06/17/2019 0818   HDL 37 (L) 06/17/2019 0818   CHOLHDL 4.2 06/17/2019 0818   VLDL 57 (H) 09/04/2016 1036   LDLCALC 90 06/17/2019 0818   No results found for this or any previous visit (from the past 2160 hour(s)).   Assessment & Plan:   1. Uncontrolled type 2 diabetes mellitus with complication, with long-term current use of insulin ([place])  She presents today with her meter and logs showing improved and near target fasting and postprandial glycemic profile.   Her point-of-care A1c today is 8.3%, improving from last visit at 9.2%.  She denies any hypoglycemia.   Recent labs reviewed. - Patient remains at a high risk for more acute and chronic complications of diabetes which include CAD, CVA, CKD, retinopathy, and neuropathy. These are all discussed in detail with the patient.  - The patient admits there is a room for improvement in their diet and drink choices. -  Suggestion is made for the patient to avoid simple carbohydrates from their diet including Cakes, Sweet Desserts / Pastries, Ice Cream, Soda (diet and regular), Sweet Tea, Candies, Chips, Cookies, Sweet Pastries,  Store Bought Juices, Alcohol in Excess of  1-2 drinks a day, Artificial Sweeteners, Coffee Creamer, and "Sugar-free" Products. This will help patient  to have stable blood glucose profile and potentially avoid unintended weight gain.   - I encouraged the patient to switch to  unprocessed or minimally processed complex starch and increased protein intake (animal or plant source), fruits, and vegetables.   - Patient is advised to stick to a routine mealtimes to eat 3 meals  a day and avoid unnecessary snacks ( to snack only to correct hypoglycemia).   - I have approached patient with the following individualized plan to manage diabetes and patient agrees.   -Based on her presentation with improved and near target glycemic profile, she will continue to benefit from intensive treatment with higher dose of basal/bolus insulin.    -She will tolerate an increase of her Toujeo to 100 units SQ nightly.  She is to continue Humalog 20-26 units 3 times daily with meals if blood glucose readings are above 90 and she is eating.  Specific instructions on how to titrate insulin dose up based off of blood sugar given to patient in writing.  She is advised to continue Metformin 1000 mg ER p.o. daily with breakfast and continue glipizide 5 mg XL daily with breakfast.  -She is advised to monitor blood  glucose at least 4 times a day, before meals and at bedtime and report blood glucose readings less than 70 or greater than 200 for 3 tests in a row.  - Patient specific target  for A1c; LDL, HDL, Triglycerides, and  Waist Circumference were discussed in detail.  2) BP/HTN:  Her blood pressure is controlled to target.  She is advised to continue atenolol 50 mg p.o. twice daily, hydrochlorothiazide 12.5 mg p.o. daily, and losartan 50 mg p.o. daily.  3) Lipids/HPL: Her previous lipid profile shows controlled LDL at 90, and elevated triglycerides at 174 on 06/2019.  She is advised to continue Crestor 20 mg p.o. daily at bedtime.  4)  Weight/Diet:  Her Body mass index is 35.94 kg/m. she is a candidate for modest weight loss.  CDE consult in progress, exercise, and carbohydrates information provided.  5) Chronic Care/Health Maintenance:  -Patient  on ACE and statin medications and is encouraged to continue to follow up with Ophthalmology, Podiatrist at least yearly or according to recommendations, and advised to  stay away from smoking. I have recommended yearly flu vaccine and pneumonia vaccination at least every 5 years; moderate intensity exercise for up to 150 minutes weekly; and  sleep for at least 7 hours a day.  I advised patient to maintain close follow up with her PCP for primary care needs.  - Time spent on this patient care encounter:  35 min, of which > 50% was spent in  counseling and the rest reviewing her blood glucose logs , discussing her hypoglycemia and hyperglycemia episodes, reviewing her current and  previous labs / studies  ( including abstraction from other facilities) and medications  doses and developing a  long term treatment plan and documenting her care.   Please refer to Patient Instructions for Blood Glucose Monitoring and Insulin/Medications Dosing Guide"  in media tab for additional information. Please  also refer to " Patient Self Inventory" in the Media  tab for  reviewed elements of pertinent patient history.  Royetta Crochet [place] participated in the discussions, expressed understanding, and voiced agreement with the above plans.  All questions were answered to her satisfaction. she is encouraged to contact clinic should she have any questions or concerns prior to her return visit.     Follow up  plan: Return in about 4 months (around 01/27/2020) for Diabetes follow up, Previsit labs, Bring glucometer and logs, Virtual visit ok.  Rayetta Pigg, FNP-BC [facility] Phone: [phone] Fax: [phone]   -  This note was partially dictated with voice recognition software. Similar sounding words can be transcribed inadequately or may not  be corrected upon review.  09/27/2019, 9:22 AM

## 2019-10-12 ENCOUNTER — Ambulatory Visit: Payer: Medicare Other | Admitting: Cardiology

## 2019-10-12 ENCOUNTER — Encounter: Payer: Self-pay | Admitting: Cardiology

## 2019-10-12 ENCOUNTER — Encounter: Payer: Self-pay | Admitting: *Deleted

## 2019-10-12 VITALS — BP 138/80 | HR 87 | Ht 65.0 in | Wt 215.6 lb

## 2019-10-12 DIAGNOSIS — R0602 Shortness of breath: Secondary | ICD-10-CM

## 2019-10-12 DIAGNOSIS — R0789 Other chest pain: Secondary | ICD-10-CM

## 2019-10-12 MED ORDER — PANTOPRAZOLE SODIUM 40 MG PO TBEC: 40.0000 mg | DELAYED_RELEASE_TABLET | Freq: Every day | ORAL | 1 refills | Status: DC

## 2019-10-12 NOTE — Patient Instructions (Addendum)
Your physician recommends that you schedule a follow-up appointment in: [place], NP  Your physician has recommended you make the following change in your medication:   STOP OMEPRAZOLE   START PROTONIX 40 MG DAILY  Thank you for choosing [facility]!!

## 2019-10-12 NOTE — Progress Notes (Signed)
Clinical Summary Ms. Gaumond is a 64 y.o.female seen today as a new patient for the following medical problems.   1. SOB - 03/2015 echo [facility]: LVEF >55%, grade I DDx,  no significant valve disease - 03/2015 stress test, dont seen in care everywhere - mainly occurs with laying flat - recent BNP 19  2. Chest pain - some recent chest pains. Often after a meal, better with position.  - sharp/dull pains left sided. 5-6/10 in severity. No other associated symptoms. Constant for a hours a time. Leaning in recliner made worst, better with laying on right side -not better with baking soda with water. - takes omeprazole daily.    3. SOB with laying flat BNP 19 - prior normal echo    4. HTN - compliant with meds    5. Multiple sclerosis   Past Medical History:  Diagnosis Date   Diabetes mellitus without complication (HCC)    Multiple sclerosis (HCC)      Allergies  Allergen Reactions   Lisinopril      Current Outpatient Medications  Medication Sig Dispense Refill   Accu-Chek Softclix Lancets lancets 100 each by Other route 4 (four) times daily. Use as instructed 200 each 3   ARIPiprazole (ABILIFY) 2 MG tablet Take 2 mg by mouth daily.     atenolol (TENORMIN) 50 MG tablet Take 1 tablet (50 mg total) by mouth 2 (two) times daily. 180 tablet 0   B-D ULTRAFINE III SHORT PEN 31G X 8 MM MISC USE 1  NEEDLE 4 TIMES DAILY 150 each 3   Cholecalciferol (VITAMIN D3) 125 MCG (5000 UT) CAPS Take by mouth daily.     diclofenac Sodium (VOLTAREN) 1 % GEL SMARTSIG:2 Gram(s) Topical Every 6-8 Hours     FLUoxetine (PROZAC) 40 MG capsule Take 40 mg by mouth at bedtime.     folic acid-vitamin b complex-vitamin c-selenium-zinc (DIALYVITE) 3 MG TABS tablet Take by mouth.     glipiZIDE (GLUCOTROL XL) 5 MG 24 hr tablet Take 1 tablet (5 mg total) by mouth daily with breakfast. 90 tablet 0   glucose blood (ACCU-CHEK AVIVA PLUS) test strip TEST 4 TIMES DAILY 400 strip 3   HUMALOG  KWIKPEN 100 UNIT/ML KwikPen INJECT 20 TO 26 UNITS SUBCUTANEOUSLY THREE TIMES DAILY BEFORE MEAL(S) 30 mL 0   hydrochlorothiazide (HYDRODIURIL) 12.5 MG tablet Take 12.5 mg by mouth daily.     ibuprofen (ADVIL) 600 MG tablet Take 600 mg by mouth 4 (four) times daily as needed.     insulin glargine, 2 Unit Dial, (TOUJEO MAX SOLOSTAR) 300 UNIT/ML Solostar Pen Inject 100 Units into the skin at bedtime. 45 mL 3   Insulin Glargine-Lixisenatide (SOLIQUA) 100-33 UNT-MCG/ML SOPN      losartan (COZAAR) 50 MG tablet Take 50 mg by mouth daily.     metFORMIN (GLUCOPHAGE-XR) 500 MG 24 hr tablet TAKE 2 TABLETS BY MOUTH ONCE DAILY WITH BREAKFAST 180 tablet 0   methylphenidate (RITALIN) 10 MG tablet Take 20 mg by mouth daily. Take 2 tablets four times daily     omeprazole (PRILOSEC) 20 MG capsule Take 20 mg by mouth daily.     rosuvastatin (CRESTOR) 20 MG tablet Take 20 mg by mouth daily.     Teriflunomide 14 MG TABS Take by mouth.     No current facility-administered medications for this visit.     Past Surgical History:  Procedure Laterality Date   BREAST BIOPSY Left    BREAST EXCISIONAL BIOPSY Left  benign   CESAREAN SECTION     NASAL SEPTUM SURGERY       Allergies  Allergen Reactions   Lisinopril       Family History  Problem Relation Age of Onset   Hypertension Mother    Cancer Mother      Social History Ms. Enns reports that she has quit smoking. She has never used smokeless tobacco. Ms. Alcaraz reports no history of alcohol use.   Review of Systems CONSTITUTIONAL: No weight loss, fever, chills, weakness or fatigue.  HEENT: Eyes: No visual loss, blurred vision, double vision or yellow sclerae.No hearing loss, sneezing, congestion, runny nose or sore throat.  SKIN: No rash or itching.  CARDIOVASCULAR: per hpi RESPIRATORY: per hpi GASTROINTESTINAL: No anorexia, nausea, vomiting or diarrhea. No abdominal pain or blood.  GENITOURINARY: No burning on urination,  no polyuria NEUROLOGICAL: No headache, dizziness, syncope, paralysis, ataxia, numbness or tingling in the extremities. No change in bowel or bladder control.  MUSCULOSKELETAL: No muscle, back pain, joint pain or stiffness.  LYMPHATICS: No enlarged nodes. No history of splenectomy.  PSYCHIATRIC: No history of depression or anxiety.  ENDOCRINOLOGIC: No reports of sweating, cold or heat intolerance. No polyuria or polydipsia.  Marland Kitchen   Physical Examination Today's Vitals   10/12/19 1511  BP: 138/80  Pulse: 87  SpO2: 94%  Weight: 215 lb 9.6 oz (97.8 kg)  Height: 5\' 5"  (1.651 m)   Body mass index is 35.88 kg/m.  Gen: resting comfortably, no acute distress HEENT: no scleral icterus, pupils equal round and reactive, no palptable cervical adenopathy,  CV: RRR, no m/r/g, no jvd Resp: Clear to auscultation bilaterally GI: abdomen is soft, non-tender, non-distended, normal bowel sounds, no hepatosplenomegaly MSK: extremities are warm, no edema.  Skin: warm, no rash Neuro:  no focal deficits Psych: appropriate affect   Diagnostic Studies  03/2015 echo  ECHOCARDIOGRAPHIC DESCRIPTIONS   AORTIC ROOT  Size:Normal  Dissection:INDETERM FOR DISSECTION   AORTIC VALVE  Leaflets:Tricuspid Morphology:Normal  Mobility:Fully mobile   LEFT VENTRICLE  Size:NormalAnterior:Normal  Contraction:Normal Lateral:Normal  Closest EF:>55% (Estimated)Septal:Normal   LV Masses:No Masses Apical:Normal   HMC:[id number]:HUTMLY  Posterior:Normal  Dias.FxClass:(Grade 1) relaxation abnormal, E/A reversal   MITRAL VALVE  Leaflets:NormalMobility:Fully mobile  Morphology:Normal   LEFT ATRIUM  Size:Normal LA Masses:No masses   IA Septum:Normal IAS   MAIN PA  Size:Normal   PULMONIC VALVE    Morphology:NormalMobility:Fully mobile   RIGHT VENTRICLE   RV Masses:No Masses Size:Normal   Free Wall:Normal Contraction:Normal   TRICUSPID VALVE  Leaflets:NormalMobility:Fully mobile  Morphology:Normal   RIGHT ATRIUM  Size:NormalRA Other:None   RA Mass:No masses   PERICARDIUM   Fluid:No effusion   INFERIOR VENACAVA  Size:Normal Normal respiratory collapse   ____________________________________________________________________   DOPPLER ECHO and OTHER SPECIAL PROCEDURES   Aortic:No AR No AS  143.0 cm/sec peak vel 8.2 mmHg peak grad  4.0 mmHg mean grad2.3 cm^2 by DOPPLER    Mitral:TRIVIAL MRNo MS  2.6 cm^2 by DOPPLER  MV Inflow E Vel=37.4 cm/sec MV Annulus E'Vel=3.8 cm/sec  E/E'Ratio=9.8   Tricuspid:TRIVIAL TRNo TS  175.0 cm/sec peak TR vel17.3 mmHg peak RV pressure   Pulmonary:TRIVIAL PRNo PS     ___________________________________________________________________________________________  INTERPRETATION  NORMAL LEFT VENTRICULAR SYSTOLIC FUNCTION  NORMAL RIGHT VENTRICULAR SYSTOLIC FUNCTION  TRIVIAL REGURGITATION NOTED (See above)  NO VALVULAR STENOSIS   Mitral: TRIVIAL MR  Tricuspid: TRIVIAL TR  Closest EF: >55% (Estimated)       Assessment and Plan  1. Chest pain - atypical symptoms,  more suggestive of GI etiology - will stop omeprazole, change to protonix 4mg  daily and monitor symptoms  2. SOB - mainly with laying down - negative cardiac workup in 2017, we will request the stress results as not able to see in care everywhere - normal LVEF in 2017, BNP 19 by recent labs, euvolemic on exam. No evidence of CHF - no further cardiac workup at  this time.      Arnoldo Lenis, M.D.

## 2019-11-02 ENCOUNTER — Other Ambulatory Visit: Payer: Self-pay | Admitting: "Endocrinology

## 2019-11-02 DIAGNOSIS — IMO0002 Reserved for concepts with insufficient information to code with codable children: Secondary | ICD-10-CM

## 2019-11-22 ENCOUNTER — Other Ambulatory Visit (HOSPITAL_COMMUNITY): Payer: Self-pay | Admitting: Family Medicine

## 2019-11-22 DIAGNOSIS — K769 Liver disease, unspecified: Secondary | ICD-10-CM

## 2019-11-22 DIAGNOSIS — R7989 Other specified abnormal findings of blood chemistry: Secondary | ICD-10-CM

## 2019-11-22 DIAGNOSIS — K760 Fatty (change of) liver, not elsewhere classified: Secondary | ICD-10-CM

## 2019-11-25 DIAGNOSIS — G4730 Sleep apnea, unspecified: Secondary | ICD-10-CM | POA: Insufficient documentation

## 2019-11-30 ENCOUNTER — Ambulatory Visit (HOSPITAL_COMMUNITY)
Admission: RE | Admit: 2019-11-30 | Discharge: 2019-11-30 | Disposition: A | Discharge status: Home / Self Care | Payer: Medicare Other | Source: Ambulatory Visit | Attending: Family Medicine | Admitting: Family Medicine

## 2019-11-30 ENCOUNTER — Other Ambulatory Visit: Payer: Self-pay

## 2019-11-30 DIAGNOSIS — R7989 Other specified abnormal findings of blood chemistry: Secondary | ICD-10-CM | POA: Insufficient documentation

## 2019-11-30 DIAGNOSIS — K760 Fatty (change of) liver, not elsewhere classified: Secondary | ICD-10-CM

## 2019-11-30 DIAGNOSIS — K769 Liver disease, unspecified: Secondary | ICD-10-CM

## 2019-12-16 ENCOUNTER — Ambulatory Visit: Payer: Medicare Other | Admitting: Cardiology

## 2019-12-20 ENCOUNTER — Other Ambulatory Visit: Payer: Self-pay | Admitting: Nurse Practitioner

## 2019-12-20 ENCOUNTER — Other Ambulatory Visit: Payer: Self-pay | Admitting: "Endocrinology

## 2019-12-20 DIAGNOSIS — IMO0002 Reserved for concepts with insufficient information to code with codable children: Secondary | ICD-10-CM

## 2019-12-20 DIAGNOSIS — E1165 Type 2 diabetes mellitus with hyperglycemia: Secondary | ICD-10-CM

## 2020-01-04 ENCOUNTER — Other Ambulatory Visit: Payer: Self-pay | Admitting: "Endocrinology

## 2020-01-17 ENCOUNTER — Other Ambulatory Visit: Payer: Self-pay | Admitting: "Endocrinology

## 2020-01-17 DIAGNOSIS — IMO0002 Reserved for concepts with insufficient information to code with codable children: Secondary | ICD-10-CM

## 2020-01-25 ENCOUNTER — Other Ambulatory Visit: Payer: Self-pay | Admitting: Nurse Practitioner

## 2020-01-26 LAB — COMPLETE METABOLIC PANEL WITH GFR
AG Ratio: 1.9 (calc) (ref 1.0–2.5)
ALT: 53 U/L — ABNORMAL HIGH (ref 6–29)
AST: 59 U/L — ABNORMAL HIGH (ref 10–35)
Albumin: 4.4 g/dL (ref 3.6–5.1)
Alkaline phosphatase (APISO): 76 U/L (ref 37–153)
BUN: 14 mg/dL (ref 7–25)
CO2: 28 mmol/L (ref 20–32)
Calcium: 9.3 mg/dL (ref 8.6–10.4)
Chloride: 99 mmol/L (ref 98–110)
Creat: 0.59 mg/dL (ref 0.50–0.99)
GFR, Est African American: 111 mL/min/{1.73_m2} (ref >=60)
GFR, Est Non African American: 96 mL/min/{1.73_m2} (ref >=60)
Globulin: 2.3 g/dL (calc) (ref 1.9–3.7)
Glucose, Bld: 258 mg/dL — ABNORMAL HIGH (ref 65–139)
Potassium: 4.6 mmol/L (ref 3.5–5.3)
Sodium: 134 mmol/L — ABNORMAL LOW (ref 135–146)
Total Bilirubin: 0.3 mg/dL (ref 0.2–1.2)
Total Protein: 6.7 g/dL (ref 6.1–8.1)

## 2020-01-26 LAB — HEMOGLOBIN A1C W/OUT EAG: Hgb A1c MFr Bld: 9.6 % of total Hgb — ABNORMAL HIGH (ref <5.7)

## 2020-01-31 ENCOUNTER — Encounter: Payer: Self-pay | Admitting: Nurse Practitioner

## 2020-01-31 ENCOUNTER — Other Ambulatory Visit: Payer: Self-pay

## 2020-01-31 ENCOUNTER — Telehealth (INDEPENDENT_AMBULATORY_CARE_PROVIDER_SITE_OTHER): Payer: Medicare Other | Admitting: Nurse Practitioner

## 2020-01-31 DIAGNOSIS — I10 Essential (primary) hypertension: Secondary | ICD-10-CM | POA: Diagnosis not present

## 2020-01-31 DIAGNOSIS — R911 Solitary pulmonary nodule: Secondary | ICD-10-CM | POA: Insufficient documentation

## 2020-01-31 DIAGNOSIS — E1142 Type 2 diabetes mellitus with diabetic polyneuropathy: Secondary | ICD-10-CM | POA: Insufficient documentation

## 2020-01-31 DIAGNOSIS — E1165 Type 2 diabetes mellitus with hyperglycemia: Secondary | ICD-10-CM | POA: Diagnosis not present

## 2020-01-31 DIAGNOSIS — B977 Papillomavirus as the cause of diseases classified elsewhere: Secondary | ICD-10-CM | POA: Insufficient documentation

## 2020-01-31 DIAGNOSIS — M5430 Sciatica, unspecified side: Secondary | ICD-10-CM | POA: Insufficient documentation

## 2020-01-31 DIAGNOSIS — E559 Vitamin D deficiency, unspecified: Secondary | ICD-10-CM | POA: Insufficient documentation

## 2020-01-31 DIAGNOSIS — E118 Type 2 diabetes mellitus with unspecified complications: Secondary | ICD-10-CM | POA: Diagnosis not present

## 2020-01-31 DIAGNOSIS — K219 Gastro-esophageal reflux disease without esophagitis: Secondary | ICD-10-CM | POA: Insufficient documentation

## 2020-01-31 DIAGNOSIS — F909 Attention-deficit hyperactivity disorder, unspecified type: Secondary | ICD-10-CM | POA: Insufficient documentation

## 2020-01-31 DIAGNOSIS — M65332 Trigger finger, left middle finger: Secondary | ICD-10-CM | POA: Insufficient documentation

## 2020-01-31 DIAGNOSIS — IMO0002 Reserved for concepts with insufficient information to code with codable children: Secondary | ICD-10-CM

## 2020-01-31 DIAGNOSIS — R0601 Orthopnea: Secondary | ICD-10-CM | POA: Insufficient documentation

## 2020-01-31 DIAGNOSIS — R61 Generalized hyperhidrosis: Secondary | ICD-10-CM | POA: Insufficient documentation

## 2020-01-31 DIAGNOSIS — M6530 Trigger finger, unspecified finger: Secondary | ICD-10-CM | POA: Insufficient documentation

## 2020-01-31 DIAGNOSIS — F39 Unspecified mood [affective] disorder: Secondary | ICD-10-CM | POA: Insufficient documentation

## 2020-01-31 DIAGNOSIS — L0293 Carbuncle, unspecified: Secondary | ICD-10-CM | POA: Insufficient documentation

## 2020-01-31 DIAGNOSIS — R413 Other amnesia: Secondary | ICD-10-CM | POA: Insufficient documentation

## 2020-01-31 DIAGNOSIS — E782 Mixed hyperlipidemia: Secondary | ICD-10-CM | POA: Diagnosis not present

## 2020-01-31 DIAGNOSIS — G35 Multiple sclerosis: Secondary | ICD-10-CM | POA: Insufficient documentation

## 2020-01-31 DIAGNOSIS — M23329 Other meniscus derangements, posterior horn of medial meniscus, unspecified knee: Secondary | ICD-10-CM | POA: Insufficient documentation

## 2020-01-31 DIAGNOSIS — A630 Anogenital (venereal) warts: Secondary | ICD-10-CM | POA: Insufficient documentation

## 2020-01-31 DIAGNOSIS — Z794 Long term (current) use of insulin: Secondary | ICD-10-CM | POA: Insufficient documentation

## 2020-01-31 DIAGNOSIS — R928 Other abnormal and inconclusive findings on diagnostic imaging of breast: Secondary | ICD-10-CM | POA: Insufficient documentation

## 2020-01-31 DIAGNOSIS — M940 Chondrocostal junction syndrome [Tietze]: Secondary | ICD-10-CM | POA: Insufficient documentation

## 2020-01-31 DIAGNOSIS — M545 Low back pain, unspecified: Secondary | ICD-10-CM | POA: Insufficient documentation

## 2020-01-31 DIAGNOSIS — N319 Neuromuscular dysfunction of bladder, unspecified: Secondary | ICD-10-CM | POA: Insufficient documentation

## 2020-01-31 DIAGNOSIS — Z6833 Body mass index (BMI) 33.0-33.9, adult: Secondary | ICD-10-CM | POA: Insufficient documentation

## 2020-01-31 DIAGNOSIS — K760 Fatty (change of) liver, not elsewhere classified: Secondary | ICD-10-CM | POA: Insufficient documentation

## 2020-01-31 MED ORDER — GLIPIZIDE ER 5 MG PO TB24: 5.0000 mg | ORAL_TABLET | Freq: Every day | ORAL | 0 refills | Status: DC

## 2020-01-31 MED ORDER — TOUJEO MAX SOLOSTAR 300 UNIT/ML ~~LOC~~ SOPN: 110.0000 [IU] | PEN_INJECTOR | Freq: Every day | SUBCUTANEOUS | 3 refills | Status: DC

## 2020-01-31 NOTE — Patient Instructions (Signed)

## 2020-01-31 NOTE — Progress Notes (Signed)
01/31/2020   Endocrinology follow-up note    TELEHEALTH VISIT: The patient is being engaged in telehealth visit due to COVID-19.  This type of visit limits physical examination significantly, and thus is not preferable over face-to-face encounters.  I connected with  Melanie Schultz on 01/31/20 by a video enabled telemedicine application and verified that I am speaking with the correct person using two identifiers.   I discussed the limitations of evaluation and management by telemedicine. The patient expressed understanding and agreed to proceed.   The participants involved in this visit include: Dani Gobble, NP located at [facility] and Melanie Schultz  located at their personal residence listed.    Subjective:    Patient ID: Melanie Schultz, female    DOB: 1955/05/03,  Melanie Schultz presents today for follow up of uncontrolled type 2 diabetes, hypertension, and hyperlipidemia.  Past Medical History:  Diagnosis Date  . Diabetes mellitus without complication (HCC)   . Multiple sclerosis (HCC)    Past Surgical History:  Procedure Laterality Date  . BREAST BIOPSY Left   . BREAST EXCISIONAL BIOPSY Left    benign  . CESAREAN SECTION    . NASAL SEPTUM SURGERY     Social History   Socioeconomic History  . Marital status: Divorced    Spouse name: Not on file  . Number of children: Not on file  . Years of education: Not on file  . Highest education level: Not on file  Occupational History  . Not on file  Tobacco Use  . Smoking status: Former Smoker    Quit date: 02/04/1978    Years since quitting: 42.0  . Smokeless tobacco: Never Used  Vaping Use  . Vaping Use: Never used  Substance and Sexual Activity  . Alcohol use: No    Alcohol/week: 0.0 standard drinks  . Drug use: No  . Sexual activity: Not on file  Other Topics Concern  . Not on file  Social History Narrative  . Not on file   Social Determinants of Health   Financial Resource  Strain: Not on file  Food Insecurity: Not on file  Transportation Needs: Not on file  Physical Activity: Not on file  Stress: Not on file  Social Connections: Not on file   Outpatient Encounter Medications as of 01/31/2020  Medication Sig  . Accu-Chek Softclix Lancets lancets 100 each by Other route 4 (four) times daily. Use as instructed  . ARIPiprazole (ABILIFY) 2 MG tablet Take 2 mg by mouth daily.  Marland Kitchen atenolol (TENORMIN) 50 MG tablet Take 1 tablet (50 mg total) by mouth 2 (two) times daily.  . B-D ULTRAFINE III SHORT PEN 31G X 8 MM MISC USE 4 TIMES DAILY  . Cholecalciferol (VITAMIN D3) 125 MCG (5000 UT) CAPS Take by mouth daily.  . diclofenac Sodium (VOLTAREN) 1 % GEL SMARTSIG:2 Gram(s) Topical Every 6-8 Hours  . FLUoxetine (PROZAC) 40 MG capsule Take 40 mg by mouth at bedtime.  . folic acid-vitamin b complex-vitamin c-selenium-zinc (DIALYVITE) 3 MG TABS tablet Take by mouth.  Marland Kitchen glucose blood (ACCU-CHEK AVIVA PLUS) test strip TEST 4 TIMES DAILY  . HUMALOG KWIKPEN 100 UNIT/ML KwikPen INJECT 20 TO 26 UNITS SUBCUTANEOUSLY THREE TIMES DAILY BEFORE MEAL(S)  . hydrochlorothiazide (HYDRODIURIL) 12.5 MG tablet Take 12.5 mg by mouth daily.  Marland Kitchen ibuprofen (ADVIL) 600 MG tablet Take 600 mg by mouth 4 (four) times daily as needed.  Marland Kitchen losartan (COZAAR) 50 MG tablet Take 50 mg by mouth  daily.  . metFORMIN (GLUCOPHAGE-XR) 500 MG 24 hr tablet TAKE 2 TABLETS BY MOUTH ONCE DAILY WITH BREAKFAST  . methylphenidate (RITALIN) 10 MG tablet Take 20 mg by mouth daily. Take 2 tablets four times daily  . pantoprazole (PROTONIX) 40 MG tablet Take 1 tablet (40 mg total) by mouth daily.  . rosuvastatin (CRESTOR) 20 MG tablet Take 20 mg by mouth daily.  . Teriflunomide 14 MG TABS Take by mouth.  . [DISCONTINUED] glipiZIDE (GLUCOTROL XL) 5 MG 24 hr tablet Take 1 tablet by mouth once daily with breakfast  . [DISCONTINUED] insulin glargine, 2 Unit Dial, (TOUJEO MAX SOLOSTAR) 300 UNIT/ML Solostar Pen Inject 100 Units into  the skin at bedtime.  Marland Kitchen glipiZIDE (GLUCOTROL XL) 5 MG 24 hr tablet Take 1 tablet (5 mg total) by mouth daily with breakfast.  . insulin glargine, 2 Unit Dial, (TOUJEO MAX SOLOSTAR) 300 UNIT/ML Solostar Pen Inject 110 Units into the skin at bedtime.  . [DISCONTINUED] B-D ULTRAFINE III SHORT PEN 31G X 8 MM MISC USE 1  NEEDLE 4 TIMES DAILY  . [DISCONTINUED] glipiZIDE (GLUCOTROL XL) 5 MG 24 hr tablet Take 1 tablet (5 mg total) by mouth daily with breakfast.  . [DISCONTINUED] HUMALOG KWIKPEN 100 UNIT/ML KwikPen INJECT 20 TO 26 UNITS SUBCUTANEOUSLY THREE TIMES DAILY BEFORE MEAL(S)  . [DISCONTINUED] insulin glargine, 2 Unit Dial, (TOUJEO MAX SOLOSTAR) 300 UNIT/ML Solostar Pen Inject 100 Units into the skin at bedtime.  . [DISCONTINUED] Insulin Glargine-Lixisenatide (SOLIQUA) 100-33 UNT-MCG/ML SOPN   . [DISCONTINUED] omeprazole (PRILOSEC) 20 MG capsule Take 20 mg by mouth daily.   No facility-administered encounter medications on file as of 01/31/2020.   ALLERGIES: Allergies  Allergen Reactions  . Lisinopril    VACCINATION STATUS:  There is no immunization history on file for this patient.  Diabetes She presents for her follow-up diabetic visit. She has type 2 diabetes mellitus. Onset time: She was diagnosed at approximate age of 48 years. Her disease course has been worsening. There are no hypoglycemic associated symptoms. Pertinent negatives for hypoglycemia include no confusion, headaches, pallor or seizures. Associated symptoms include foot paresthesias. Pertinent negatives for diabetes include no chest pain, no fatigue, no polydipsia, no polyphagia and no polyuria. There are no hypoglycemic complications. Symptoms are worsening. Diabetic complications include peripheral neuropathy. Risk factors for coronary artery disease include dyslipidemia, hypertension, obesity, sedentary lifestyle and diabetes mellitus. Current diabetic treatment includes insulin injections and oral agent (dual therapy). She  is compliant with treatment most of the time. Her weight is fluctuating minimally. She is following a generally unhealthy diet. When asked about meal planning, she reported none. She has had a previous visit with a dietitian. She participates in exercise intermittently. Her home blood glucose trend is increasing steadily. Her breakfast blood glucose range is generally 180-200 mg/dl. Her lunch blood glucose range is generally 180-200 mg/dl. Her dinner blood glucose range is generally >200 mg/dl. Her bedtime blood glucose range is generally >200 mg/dl. Her overall blood glucose range is >200 mg/dl. (She presents for her virtual visit today with her logs showing persistently high glycemic profile overall.  Her previsit A1c was 9.6%, increasing from last visit of 8.3%.  She denies any hypoglycemia.  She reports her diet has not been going well because of the holiday season.  Her last weeks worth of readings are as follows: 12/20: 202, 166, 367, 249 12/21: 142, 123, 230, 282 12/22: 168, 162, 354, 385 12/23: 141, 121, 235, 263 12/24: 185, 185, 417, 424 12/25: 177, 205,  no check, 381 12/26: 227, 154, 245, 251 12/27: 225, 211) An ACE inhibitor/angiotensin II receptor blocker is being taken. She does not see a podiatrist.Eye exam is current.  Hyperlipidemia This is a chronic problem. The current episode started more than 1 year ago. The problem is uncontrolled. Recent lipid tests were reviewed and are variable. Exacerbating diseases include diabetes and obesity. Factors aggravating her hyperlipidemia include fatty foods and beta blockers. Pertinent negatives include no chest pain, myalgias or shortness of breath. Current antihyperlipidemic treatment includes statins. The current treatment provides mild improvement of lipids. Compliance problems include adherence to diet.  Risk factors for coronary artery disease include diabetes mellitus, dyslipidemia, a sedentary lifestyle, post-menopausal, obesity and  hypertension.  Hypertension This is a chronic problem. The current episode started more than 1 year ago. The problem has been gradually improving since onset. The problem is controlled. Pertinent negatives include no chest pain, headaches, palpitations or shortness of breath. There are no associated agents to hypertension. Risk factors for coronary artery disease include diabetes mellitus, dyslipidemia, post-menopausal state, sedentary lifestyle and smoking/tobacco exposure. Past treatments include ACE inhibitors, angiotensin blockers, beta blockers and diuretics. The current treatment provides mild improvement. Compliance problems include diet.      Review of systems  Constitutional: + Minimally fluctuating body weight,  current There is no height or weight on file to calculate BMI. , no fatigue, no subjective hyperthermia, no subjective hypothermia Eyes: no blurry vision, no xerophthalmia ENT: no sore throat, no nodules palpated in throat, no dysphagia/odynophagia, no hoarseness Cardiovascular: no chest pain, no shortness of breath, no palpitations, no leg swelling Respiratory: no cough, no shortness of breath Gastrointestinal: no nausea/vomiting/diarrhea Musculoskeletal: no muscle/joint aches, + muscle weakness (history of MS) Skin: no rashes, no hyperemia Neurological: no tremors, no numbness, no tingling, + dizziness when changing positions, complains of electrical shooting pain in feet and legs intermittently Psychiatric: no depression, no anxiety    Objective:    There were no vitals taken for this visit.  Wt Readings from Last 3 Encounters:  10/12/19 215 lb 9.6 oz (97.8 kg)  09/27/19 216 lb (98 kg)  07/09/19 213 lb (96.6 kg)    BP Readings from Last 3 Encounters:  10/12/19 138/80  09/27/19 112/72  07/09/19 (!) 147/91     Physical Exam- Telehealth- significantly limited due to nature of visit  Constitutional: There is no height or weight on file to calculate BMI. , not in  acute distress, normal state of mind Respiratory: Adequate breathing efforts    Results for orders placed or performed in visit on 01/25/20  COMPLETE METABOLIC PANEL WITH GFR  Result Value Ref Range   Glucose, Bld 258 (H) 65 - 139 mg/dL   BUN 14 7 - 25 mg/dL   Creat 0.59 0.50 - 0.99 mg/dL   GFR, Est Non African American 96 > OR = 60 mL/min/1.55m2   GFR, Est African American 111 > OR = 60 mL/min/1.32m2   BUN/Creatinine Ratio NOT APPLICABLE 6 - 22 (calc)   Sodium 134 (L) 135 - 146 mmol/L   Potassium 4.6 3.5 - 5.3 mmol/L   Chloride 99 98 - 110 mmol/L   CO2 28 20 - 32 mmol/L   Calcium 9.3 8.6 - 10.4 mg/dL   Total Protein 6.7 6.1 - 8.1 g/dL   Albumin 4.4 3.6 - 5.1 g/dL   Globulin 2.3 1.9 - 3.7 g/dL (calc)   AG Ratio 1.9 1.0 - 2.5 (calc)   Total Bilirubin 0.3 0.2 - 1.2 mg/dL  Alkaline phosphatase (APISO) 76 37 - 153 U/L   AST 59 (H) 10 - 35 U/L   ALT 53 (H) 6 - 29 U/L  Hemoglobin A1C w/out eAG  Result Value Ref Range   Hgb A1c MFr Bld 9.6 (H) <5.7 % of total Hgb   Lipid Panel     Component Value Date/Time   CHOL 154 06/17/2019 0818   TRIG 174 (H) 06/17/2019 0818   HDL 37 (L) 06/17/2019 0818   CHOLHDL 4.2 06/17/2019 0818   VLDL 57 (H) 09/04/2016 1036   LDLCALC 90 06/17/2019 0818   Recent Results (from the past 2160 hour(s))  COMPLETE METABOLIC PANEL WITH GFR     Status: Abnormal   Collection Time: 01/25/20  8:28 AM  Result Value Ref Range   Glucose, Bld 258 (H) 65 - 139 mg/dL    Comment: .        Non-fasting reference interval .    BUN 14 7 - 25 mg/dL   Creat 6.56 8.12 - 7.51 mg/dL    Comment: For patients >39 years of age, the reference limit for Creatinine is approximately 13% higher for people identified as African-American. .    GFR, Est Non African American 96 > OR = 60 mL/min/1.3m2   GFR, Est African American 111 > OR = 60 mL/min/1.72m2   BUN/Creatinine Ratio NOT APPLICABLE 6 - 22 (calc)   Sodium 134 (L) 135 - 146 mmol/L   Potassium 4.6 3.5 - 5.3 mmol/L    Chloride 99 98 - 110 mmol/L   CO2 28 20 - 32 mmol/L   Calcium 9.3 8.6 - 10.4 mg/dL   Total Protein 6.7 6.1 - 8.1 g/dL   Albumin 4.4 3.6 - 5.1 g/dL   Globulin 2.3 1.9 - 3.7 g/dL (calc)   AG Ratio 1.9 1.0 - 2.5 (calc)   Total Bilirubin 0.3 0.2 - 1.2 mg/dL   Alkaline phosphatase (APISO) 76 37 - 153 U/L   AST 59 (H) 10 - 35 U/L   ALT 53 (H) 6 - 29 U/L  Hemoglobin A1C w/out eAG     Status: Abnormal   Collection Time: 01/25/20  8:28 AM  Result Value Ref Range   Hgb A1c MFr Bld 9.6 (H) <5.7 % of total Hgb    Comment: For someone without known diabetes, a hemoglobin A1c value of 6.5% or greater indicates that they may have  diabetes and this should be confirmed with a follow-up  test. . For someone with known diabetes, a value <7% indicates  that their diabetes is well controlled and a value  greater than or equal to 7% indicates suboptimal  control. A1c targets should be individualized based on  duration of diabetes, age, comorbid conditions, and  other considerations. . Currently, no consensus exists regarding use of hemoglobin A1c for diagnosis of diabetes for children. .      Assessment & Plan:   1. Uncontrolled type 2 diabetes mellitus with complication, with long-term current use of insulin (HCC)  She presents for her virtual visit today with her logs showing persistently high glycemic profile overall.  Her previsit A1c was 9.6%, increasing from last visit of 8.3%.  She denies any hypoglycemia.  She reports her diet has not been going well because of the holiday season.  Her last weeks worth of readings are as follows: 12/20: 202, 166, 367, 249 12/21: 142, 123, 230, 282 12/22: 168, 162, 354, 385 12/23: 141, 121, 235, 263 12/24: 185, 185, 417, 424 12/25: 177, 205,  no check, 381 12/26: 227, 154, 245, 251 12/27: 225, 211   Recent labs reviewed.  - Patient remains at a high risk for more acute and chronic complications of diabetes which include CAD, CVA, CKD,  retinopathy, and neuropathy. These are all discussed in detail with the patient.  - Nutritional counseling repeated at each appointment due to patients tendency to fall back in to old habits.  - The patient admits there is a room for improvement in their diet and drink choices. -  Suggestion is made for the patient to avoid simple carbohydrates from their diet including Cakes, Sweet Desserts / Pastries, Ice Cream, Soda (diet and regular), Sweet Tea, Candies, Chips, Cookies, Sweet Pastries,  Store Bought Juices, Alcohol in Excess of  1-2 drinks a day, Artificial Sweeteners, Coffee Creamer, and "Sugar-free" Products. This will help patient to have stable blood glucose profile and potentially avoid unintended weight gain.   - I encouraged the patient to switch to  unprocessed or minimally processed complex starch and increased protein intake (animal or plant source), fruits, and vegetables.   - Patient is advised to stick to a routine mealtimes to eat 3 meals  a day and avoid unnecessary snacks ( to snack only to correct hypoglycemia).  - I have approached patient with the following individualized plan to manage diabetes and patient agrees.  -Based on her persistent hyperglycemia, she will tolerate increase in her Toujeo to 110 units SQ daily at bedtime.  She is advised to continue her Humalog 20-26 units TID with meals if glucose is above 90 and she is eating.  Specific instructions on how to titrate insulin dose based on glucose readings will be sent to patient in writing.  She is advised to continue Metformin 1000 mg ER daily with breakfast and continue Glipizide 5 mg XL daily with breakfast.  -She is advised to monitor blood glucose at least 4 times a day, before meals and at bedtime and report blood glucose readings less than 70 or greater than 200 for 3 tests in a row.  - Patient specific target  for A1c; LDL, HDL, Triglycerides, and  Waist Circumference were discussed in detail.  2) BP/HTN:   Her blood pressure is controlled to target according to previous visit readings (she does not have means to monitor BP at home).  She is advised to continue Atenolol 50 mg p.o. twice daily, Hydrochlorothiazide 12.5 mg p.o. daily, and Losartan 50 mg p.o. daily.  3) Lipids/HPL: Her most recent lipid panel from 06/17/19 show controlled LDL at 90 and elevated triglycerides of 174.  She is advised to continue Crestor 20 mg po daily at bedtime.  Side effects and precautions discussed with her.  4)  Weight/Diet:  Her There is no height or weight on file to calculate BMI. she is a candidate for modest weight loss.  CDE consult in progress, exercise, and carbohydrates information provided.  5) Chronic Care/Health Maintenance: -Patient is on ACE and Statin medications and is encouraged to continue to follow up with Ophthalmology, Podiatrist at least yearly or according to recommendations, and advised to  stay away from smoking. I have recommended yearly flu vaccine and pneumonia vaccination at least every 5 years; moderate intensity exercise for up to 150 minutes weekly; and  sleep for at least 7 hours a day.  I advised patient to maintain close follow up with her PCP for primary care needs.   I spent 30 minutes dedicated to the care of this patient on the date  of this encounter to include pre-visit review of records, face-to-face time with the patient, and post visit ordering of  testing.   Please refer to Patient Instructions for Blood Glucose Monitoring and Insulin/Medications Dosing Guide"  in media tab for additional information. Please  also refer to " Patient Self Inventory" in the Media  tab for reviewed elements of pertinent patient history.  Melanie Schultz participated in the discussions, expressed understanding, and voiced agreement with the above plans.  All questions were answered to her satisfaction. she is encouraged to contact clinic should she have any questions or concerns prior to her  return visit.     Follow up plan: Return in about 3 months (around 04/30/2020) for Diabetes follow up with A1c in office, No previsit labs, ABI next visit, Bring glucometer and logs.  Ronny Bacon, [facility] [address] [place], [place] [address] Phone: [phone] Fax: [phone]  01/31/2020, 2:16 PM

## 2020-02-03 ENCOUNTER — Other Ambulatory Visit: Payer: Self-pay | Admitting: Nurse Practitioner

## 2020-02-03 DIAGNOSIS — IMO0002 Reserved for concepts with insufficient information to code with codable children: Secondary | ICD-10-CM

## 2020-02-07 ENCOUNTER — Other Ambulatory Visit: Payer: Self-pay

## 2020-02-07 DIAGNOSIS — IMO0002 Reserved for concepts with insufficient information to code with codable children: Secondary | ICD-10-CM

## 2020-02-07 DIAGNOSIS — E1165 Type 2 diabetes mellitus with hyperglycemia: Secondary | ICD-10-CM

## 2020-02-07 MED ORDER — TOUJEO MAX SOLOSTAR 300 UNIT/ML ~~LOC~~ SOPN: 110.0000 [IU] | PEN_INJECTOR | Freq: Every day | SUBCUTANEOUS | 3 refills | Status: DC

## 2020-02-23 ENCOUNTER — Other Ambulatory Visit: Payer: Self-pay | Admitting: Nurse Practitioner

## 2020-03-14 ENCOUNTER — Other Ambulatory Visit: Payer: Self-pay | Admitting: "Endocrinology

## 2020-03-20 ENCOUNTER — Encounter: Payer: Self-pay | Admitting: Orthopedic Surgery

## 2020-03-20 ENCOUNTER — Ambulatory Visit (INDEPENDENT_AMBULATORY_CARE_PROVIDER_SITE_OTHER): Payer: Medicare HMO | Admitting: Orthopedic Surgery

## 2020-03-20 ENCOUNTER — Other Ambulatory Visit: Payer: Self-pay

## 2020-03-20 VITALS — BP 144/91 | HR 94 | Ht 65.0 in | Wt 215.0 lb

## 2020-03-20 DIAGNOSIS — M65331 Trigger finger, right middle finger: Secondary | ICD-10-CM | POA: Diagnosis not present

## 2020-03-20 NOTE — Progress Notes (Signed)
Chief Complaint  Patient presents with  . Hand Pain    Patient reports right hand x 2 weeks,     65 year old female history of right long finger trigger phenomena status post injection a year ago injection lasted 1 year.  2 weeks ago pain came back  Physical Exam Constitutional:      General: She is not in acute distress.    Appearance: She is well-developed.     Comments: Well developed, well nourished Normal grooming and hygiene     Cardiovascular:     Comments: No peripheral edema Musculoskeletal:     Comments: Tenderness over the A1 pulley right long finger clicking and popping noted does not stick.    Skin:    General: Skin is warm and dry.  Neurological:     Mental Status: She is alert and oriented to person, place, and time.     Sensory: No sensory deficit.     Coordination: Coordination normal.     Gait: Gait normal.     Deep Tendon Reflexes: Reflexes are normal and symmetric.  Psychiatric:        Mood and Affect: Mood normal.        Behavior: Behavior normal.        Thought Content: Thought content normal.        Judgment: Judgment normal.     Comments: Affect normal      Trigger finger injection  Diagnosis tenosynovitis  right long finger procedure injection A1 pulley Medications lidocaine 1% 1 mL and Celestone Skin prep alcohol and ethyl chloride Verbal consent was obtained Timeout confirmed the injection site  After cleaning the skin with alcohol and anesthetizing the skin with ethyl chloride the A1 pulley was palpated and the injection was performed without complication  Encounter Diagnosis  Name Primary?  . Trigger finger, right middle finger Yes   Return as needed   (Chronic problem w exacerbation + injection )

## 2020-03-27 ENCOUNTER — Other Ambulatory Visit: Payer: Self-pay | Admitting: Nurse Practitioner

## 2020-03-27 DIAGNOSIS — IMO0002 Reserved for concepts with insufficient information to code with codable children: Secondary | ICD-10-CM

## 2020-03-27 DIAGNOSIS — E1165 Type 2 diabetes mellitus with hyperglycemia: Secondary | ICD-10-CM

## 2020-03-28 ENCOUNTER — Other Ambulatory Visit: Payer: Self-pay

## 2020-03-28 DIAGNOSIS — E1165 Type 2 diabetes mellitus with hyperglycemia: Secondary | ICD-10-CM

## 2020-03-28 DIAGNOSIS — IMO0002 Reserved for concepts with insufficient information to code with codable children: Secondary | ICD-10-CM

## 2020-03-28 MED ORDER — NOVOLOG FLEXPEN 100 UNIT/ML ~~LOC~~ SOPN: 20.0000 [IU] | PEN_INJECTOR | Freq: Three times a day (TID) | SUBCUTANEOUS | 0 refills | Status: DC

## 2020-04-10 ENCOUNTER — Other Ambulatory Visit: Payer: Self-pay | Admitting: "Endocrinology

## 2020-04-10 ENCOUNTER — Other Ambulatory Visit: Payer: Self-pay | Admitting: Nurse Practitioner

## 2020-04-10 DIAGNOSIS — IMO0002 Reserved for concepts with insufficient information to code with codable children: Secondary | ICD-10-CM

## 2020-04-10 DIAGNOSIS — E1165 Type 2 diabetes mellitus with hyperglycemia: Secondary | ICD-10-CM

## 2020-04-27 ENCOUNTER — Other Ambulatory Visit: Payer: Self-pay | Admitting: Nurse Practitioner

## 2020-04-27 DIAGNOSIS — E1165 Type 2 diabetes mellitus with hyperglycemia: Secondary | ICD-10-CM

## 2020-04-27 DIAGNOSIS — IMO0002 Reserved for concepts with insufficient information to code with codable children: Secondary | ICD-10-CM

## 2020-05-02 ENCOUNTER — Ambulatory Visit: Payer: Self-pay | Admitting: Nurse Practitioner

## 2020-05-02 NOTE — Patient Instructions (Incomplete)

## 2020-05-04 ENCOUNTER — Ambulatory Visit: Payer: Medicare HMO | Admitting: Nurse Practitioner

## 2020-05-04 ENCOUNTER — Other Ambulatory Visit: Payer: Self-pay

## 2020-05-04 ENCOUNTER — Encounter: Payer: Self-pay | Admitting: Nurse Practitioner

## 2020-05-04 VITALS — BP 170/96 | HR 82 | Ht 65.0 in | Wt 219.0 lb

## 2020-05-04 DIAGNOSIS — I10 Essential (primary) hypertension: Secondary | ICD-10-CM | POA: Diagnosis not present

## 2020-05-04 DIAGNOSIS — Z794 Long term (current) use of insulin: Secondary | ICD-10-CM | POA: Diagnosis not present

## 2020-05-04 DIAGNOSIS — E559 Vitamin D deficiency, unspecified: Secondary | ICD-10-CM

## 2020-05-04 DIAGNOSIS — E118 Type 2 diabetes mellitus with unspecified complications: Secondary | ICD-10-CM | POA: Diagnosis not present

## 2020-05-04 DIAGNOSIS — E782 Mixed hyperlipidemia: Secondary | ICD-10-CM | POA: Diagnosis not present

## 2020-05-04 DIAGNOSIS — E1165 Type 2 diabetes mellitus with hyperglycemia: Secondary | ICD-10-CM

## 2020-05-04 DIAGNOSIS — IMO0002 Reserved for concepts with insufficient information to code with codable children: Secondary | ICD-10-CM

## 2020-05-04 LAB — POCT GLYCOSYLATED HEMOGLOBIN (HGB A1C): Hemoglobin A1C: 8.9 % — AB (ref 4.0–5.6)

## 2020-05-04 NOTE — Progress Notes (Signed)
05/04/2020   Endocrinology follow-up note    Subjective:    Patient ID: Melanie Schultz, female    DOB: [birth year],  Melanie Schultz presents today for follow up of uncontrolled type 2 diabetes, hypertension, and hyperlipidemia.  Past Medical History:  Diagnosis Date  . Diabetes mellitus without complication ([place])   . Multiple sclerosis ([place])    Past Surgical History:  Procedure Laterality Date  . BREAST BIOPSY Left   . BREAST EXCISIONAL BIOPSY Left    benign  . CESAREAN SECTION    . NASAL SEPTUM SURGERY     Social History   Socioeconomic History  . Marital status: Divorced    Spouse name: Not on file  . Number of children: Not on file  . Years of education: Not on file  . Highest education level: Not on file  Occupational History  . Not on file  Tobacco Use  . Smoking status: Former Smoker    Quit date: 02/04/1978    Years since quitting: 42.2  . Smokeless tobacco: Never Used  Vaping Use  . Vaping Use: Never used  Substance and Sexual Activity  . Alcohol use: No    Alcohol/week: 0.0 standard drinks  . Drug use: No  . Sexual activity: Not on file  Other Topics Concern  . Not on file  Social History Narrative  . Not on file   Social Determinants of Health   Financial Resource Strain: Not on file  Food Insecurity: Not on file  Transportation Needs: Not on file  Physical Activity: Not on file  Stress: Not on file  Social Connections: Not on file   Outpatient Encounter Medications as of 05/04/2020  Medication Sig  . omeprazole (PRILOSEC) 10 MG capsule Take 10 mg by mouth daily.  . Probiotic Product (PROBIOTIC PO) Take by mouth daily.  Marland Kitchen UNABLE TO FIND Med Name: Tumeric, Liver essentials, Evening primrose, iron, Artichoke extract, Milk thistle                      Takes these once daily.  . Accu-Chek Softclix Lancets lancets 100 each by Other route 4 (four) times daily. Use as instructed  . ARIPiprazole (ABILIFY) 2 MG tablet Take 2 mg by mouth daily.  Marland Kitchen  atenolol (TENORMIN) 50 MG tablet Take 1 tablet (50 mg total) by mouth 2 (two) times daily.  . B-D ULTRAFINE III SHORT PEN 31G X 8 MM MISC USE 1 PEN NEEDLE AS DIRECTED 4 TIMES DAILY  . Cholecalciferol (VITAMIN D3) 125 MCG (5000 UT) CAPS Take by mouth daily.  Marland Kitchen FLUoxetine (PROZAC) 40 MG capsule Take 40 mg by mouth at bedtime.  . folic acid-vitamin b complex-vitamin c-selenium-zinc (DIALYVITE) 3 MG TABS tablet Take by mouth.  Marland Kitchen glipiZIDE (GLUCOTROL XL) 5 MG 24 hr tablet Take 1 tablet by mouth once daily with breakfast  . glucose blood (ACCU-CHEK AVIVA PLUS) test strip TEST 4 TIMES DAILY  . hydrochlorothiazide (HYDRODIURIL) 12.5 MG tablet Take 12.5 mg by mouth daily.  . insulin aspart (NOVOLOG FLEXPEN) 100 UNIT/ML FlexPen Inject 20-26 Units into the skin 3 (three) times daily with meals.  . insulin glargine, 2 Unit Dial, (TOUJEO MAX SOLOSTAR) 300 UNIT/ML Solostar Pen Inject 110 Units into the skin at bedtime.  Marland Kitchen losartan (COZAAR) 50 MG tablet Take 50 mg by mouth daily.  . metFORMIN (GLUCOPHAGE-XR) 500 MG 24 hr tablet TAKE 2 TABLETS BY MOUTH ONCE DAILY WITH BREAKFAST  . methylphenidate (RITALIN) 10 MG tablet Take 20 mg  by mouth daily. Take 2 tablets four times daily  . rosuvastatin (CRESTOR) 20 MG tablet Take 20 mg by mouth daily.  . Teriflunomide 14 MG TABS Take by mouth.  . [DISCONTINUED] diclofenac Sodium (VOLTAREN) 1 % GEL SMARTSIG:2 Gram(s) Topical Every 6-8 Hours  . [DISCONTINUED] ibuprofen (ADVIL) 600 MG tablet Take 600 mg by mouth 4 (four) times daily as needed.  . [DISCONTINUED] pantoprazole (PROTONIX) 40 MG tablet Take 1 tablet (40 mg total) by mouth daily.   No facility-administered encounter medications on file as of 05/04/2020.   ALLERGIES: Allergies  Allergen Reactions  . Lisinopril    VACCINATION STATUS:  There is no immunization history on file for this patient.  Diabetes She presents for her follow-up diabetic visit. She has type 2 diabetes mellitus. Onset time: She was  diagnosed at approximate age of 7 years. Her disease course has been improving. There are no hypoglycemic associated symptoms. Pertinent negatives for hypoglycemia include no confusion, headaches, pallor or seizures. Associated symptoms include foot paresthesias. Pertinent negatives for diabetes include no chest pain, no fatigue, no polydipsia, no polyphagia and no polyuria. There are no hypoglycemic complications. Symptoms are improving. Diabetic complications include peripheral neuropathy. Risk factors for coronary artery disease include dyslipidemia, hypertension, obesity, sedentary lifestyle and diabetes mellitus. Current diabetic treatment includes oral agent (dual therapy) and intensive insulin program. She is compliant with treatment most of the time. Her weight is fluctuating minimally. She is following a generally unhealthy diet. When asked about meal planning, she reported none. She has had a previous visit with a dietitian. She participates in exercise intermittently. Her home blood glucose trend is decreasing steadily. Her breakfast blood glucose range is generally 140-180 mg/dl. Her lunch blood glucose range is generally 180-200 mg/dl. Her dinner blood glucose range is generally 180-200 mg/dl. Her bedtime blood glucose range is generally >200 mg/dl. Her overall blood glucose range is 180-200 mg/dl. (She presents today with her meter and logs showing improved, yet still above target fasting and postprandial glycemic profile.  Her POCT A1c today is 8.9%, improving from last visit of 9.6%.  She recently started to lower her amount of carbs at each meal and her glucose readings have improved further.  She denies any episodes of hypoglycemia.) An ACE inhibitor/angiotensin II receptor blocker is being taken. She does not see a podiatrist.Eye exam is current.  Hyperlipidemia This is a chronic problem. The current episode started more than 1 year ago. The problem is uncontrolled. Recent lipid tests were  reviewed and are variable. Exacerbating diseases include diabetes and obesity. Factors aggravating her hyperlipidemia include fatty foods, beta blockers and thiazides. Pertinent negatives include no chest pain, myalgias or shortness of breath. Current antihyperlipidemic treatment includes statins. The current treatment provides mild improvement of lipids. Compliance problems include adherence to diet.  Risk factors for coronary artery disease include diabetes mellitus, dyslipidemia, a sedentary lifestyle, post-menopausal, obesity and hypertension.  Hypertension This is a chronic problem. The current episode started more than 1 year ago. The problem has been waxing and waning since onset. The problem is uncontrolled. Pertinent negatives include no chest pain, headaches, palpitations or shortness of breath. Agents associated with hypertension include amphetamines. Risk factors for coronary artery disease include diabetes mellitus, dyslipidemia, post-menopausal state, sedentary lifestyle and smoking/tobacco exposure. Past treatments include ACE inhibitors, angiotensin blockers, beta blockers and diuretics. The current treatment provides mild improvement. Compliance problems include diet and exercise.      Review of systems  Constitutional: + Minimally fluctuating body weight,  current Body mass index is 36.44 kg/m. , no fatigue, no subjective hyperthermia, no subjective hypothermia Eyes: no blurry vision, no xerophthalmia ENT: no sore throat, no nodules palpated in throat, no dysphagia/odynophagia, no hoarseness Cardiovascular: no chest pain, no shortness of breath, no palpitations, no leg swelling Respiratory: no cough, no shortness of breath Gastrointestinal: no nausea/vomiting/diarrhea Musculoskeletal: no muscle/joint aches, + muscle weakness (history of MS) Skin: no rashes, no hyperemia Neurological: no tremors, + numbness/tingling to BLE, no dizziness Psychiatric: no depression, no  anxiety    Objective:    BP (!) 170/96   Pulse 82   Ht 5\' 5"  (1.651 m)   Wt 219 lb (99.3 kg)   BMI 36.44 kg/m   Wt Readings from Last 3 Encounters:  05/04/20 219 lb (99.3 kg)  03/20/20 215 lb (97.5 kg)  10/12/19 215 lb 9.6 oz (97.8 kg)    BP Readings from Last 3 Encounters:  05/04/20 (!) 170/96  03/20/20 (!) 144/91  10/12/19 138/80      Physical Exam- Limited  Constitutional:  Body mass index is 36.44 kg/m. , not in acute distress, normal state of mind Eyes:  EOMI, no exophthalmos Neck: Supple Cardiovascular: RRR, no murmers, rubs, or gallops, no edema Respiratory: Adequate breathing efforts, no crackles, rales, rhonchi, or wheezing Musculoskeletal: no gross deformities, strength intact in all four extremities, no gross restriction of joint movements Skin:  no rashes, no hyperemia Neurological: no tremor with outstretched hands  POCT ABI Results 05/04/20   Right ABI:  1.06      Left ABI:  1.14  Right leg systolic / diastolic: 161/09 mmHg Left leg systolic / diastolic: 604/54 mmHg  Arm systolic / diastolic: 098/11 mmHG  Detailed report will be scanned into patient chart.    Results for orders placed or performed in visit on 05/04/20  HgB A1c  Result Value Ref Range   Hemoglobin A1C 8.9 (A) 4.0 - 5.6 %   HbA1c POC (<> result, manual entry)     HbA1c, POC (prediabetic range)     HbA1c, POC (controlled diabetic range)     Lipid Panel     Component Value Date/Time   CHOL 154 06/17/2019 0818   TRIG 174 (H) 06/17/2019 0818   HDL 37 (L) 06/17/2019 0818   CHOLHDL 4.2 06/17/2019 0818   VLDL 57 (H) 09/04/2016 1036   LDLCALC 90 06/17/2019 0818   Recent Results (from the past 2160 hour(s))  HgB A1c     Status: Abnormal   Collection Time: 05/04/20 11:06 AM  Result Value Ref Range   Hemoglobin A1C 8.9 (A) 4.0 - 5.6 %   HbA1c POC (<> result, manual entry)     HbA1c, POC (prediabetic range)     HbA1c, POC (controlled diabetic range)       Assessment &  Plan:   1) Uncontrolled type 2 diabetes mellitus with complication, with long-term current use of insulin ([place])  She presents today with her meter and logs showing improved, yet still above target fasting and postprandial glycemic profile.  Her POCT A1c today is 8.9%, improving from last visit of 9.6%.  She recently started to lower her amount of carbs at each meal and her glucose readings have improved further.  She denies any episodes of hypoglycemia.   Recent labs reviewed.  - Patient remains at a high risk for more acute and chronic complications of diabetes which include CAD, CVA, CKD, retinopathy, and neuropathy. These are all discussed in detail with the patient.  -  Nutritional counseling repeated at each appointment due to patients tendency to fall back in to old habits.  - The patient admits there is a room for improvement in their diet and drink choices. -  Suggestion is made for the patient to avoid simple carbohydrates from their diet including Cakes, Sweet Desserts / Pastries, Ice Cream, Soda (diet and regular), Sweet Tea, Candies, Chips, Cookies, Sweet Pastries,  Store Bought Juices, Alcohol in Excess of  1-2 drinks a day, Artificial Sweeteners, Coffee Creamer, and "Sugar-free" Products. This will help patient to have stable blood glucose profile and potentially avoid unintended weight gain.   - I encouraged the patient to switch to  unprocessed or minimally processed complex starch and increased protein intake (animal or plant source), fruits, and vegetables.   - Patient is advised to stick to a routine mealtimes to eat 3 meals  a day and avoid unnecessary snacks ( to snack only to correct hypoglycemia).  - I have approached patient with the following individualized plan to manage diabetes and patient agrees.  -Given her recent diet change in improvement in her numbers, she is advised to continue same medication regimen for now.  She can continue Toujeo 110 units SQ nightly,  continue Novolog 20-26 units TID with meals if glucose is above 90 and she is eating.  Specific instructions on how to titrate insulin dose based on glucose readings given to patient in writing.  She can also continue Metformin 1000 mg ER daily with breakfast and Glipizide 5 mg XL daily with breakfast.   -She is encouraged to continue to monitor blood glucose at least 4 times a day, before meals and at bedtime and report blood glucose readings less than 70 or greater than 200 for 3 tests in a row.  She would greatly benefit from CGM device.  Sample Dexcom provided from office today and Rx will be sent to [facility].  - Patient specific target  for A1c; LDL, HDL, Triglycerides, and  Waist Circumference were discussed in detail.  2) BP/HTN:  Her blood pressure is not controlled to target.  She is advised to continue Atenolol 50 mg p.o. twice daily, Hydrochlorothiazide 12.5 mg p.o. daily, and Losartan 50 mg p.o. daily.  She says she had a particularly stressful morning.  Will consider med changes at next visit if still elevated.  3) Lipids/HPL: Her most recent lipid panel from 06/17/19 show controlled LDL at 90 and elevated triglycerides of 174.  She is advised to continue Crestor 20 mg po daily at bedtime.  Side effects and precautions discussed with her.  Will recheck lipid panel prior to next visit.  4)  Weight/Diet:  Her Body mass index is 36.44 kg/m. -clearly complicating her diabetes management.  She is a candidate for modest weight loss.  CDE consult in progress, exercise, and carbohydrates information provided.  5) Chronic Care/Health Maintenance: -Patient is on ACE and Statin medications and is encouraged to continue to follow up with Ophthalmology, Podiatrist at least yearly or according to recommendations, and advised to  stay away from smoking. I have recommended yearly flu vaccine and pneumonia vaccination at least every 5 years; moderate intensity exercise for up to 150 minutes weekly; and   sleep for at least 7 hours a day.  I advised patient to maintain close follow up with her PCP for primary care needs.   - Time spent on this patient care encounter:  40 min, of which > 50% was spent in  counseling and the rest reviewing  her blood glucose logs , discussing her hypoglycemia and hyperglycemia episodes, reviewing her current and  previous labs / studies  ( including abstraction from other facilities) and medications  doses and developing a  long term treatment plan and documenting her care.   Please refer to Patient Instructions for Blood Glucose Monitoring and Insulin/Medications Dosing Guide"  in media tab for additional information. Please  also refer to " Patient Self Inventory" in the Media  tab for reviewed elements of pertinent patient history.  Royetta Crochet Gluth participated in the discussions, expressed understanding, and voiced agreement with the above plans.  All questions were answered to her satisfaction. she is encouraged to contact clinic should she have any questions or concerns prior to her return visit.     Follow up plan: Return in about 3 months (around 08/03/2020) for Diabetes follow up- A1c and urine micro in office, Previsit labs, Bring glucometer and logs.  Rayetta Pigg, [facility] [address] [place], [place] [address] Phone: [phone] Fax: [phone]  05/04/2020, 11:34 AM

## 2020-05-04 NOTE — Patient Instructions (Signed)

## 2020-05-08 ENCOUNTER — Other Ambulatory Visit (HOSPITAL_COMMUNITY): Payer: Self-pay | Admitting: Family Medicine

## 2020-05-08 DIAGNOSIS — R911 Solitary pulmonary nodule: Secondary | ICD-10-CM

## 2020-05-11 ENCOUNTER — Other Ambulatory Visit: Payer: Self-pay | Admitting: Nurse Practitioner

## 2020-05-11 DIAGNOSIS — E1165 Type 2 diabetes mellitus with hyperglycemia: Secondary | ICD-10-CM

## 2020-05-11 DIAGNOSIS — IMO0002 Reserved for concepts with insufficient information to code with codable children: Secondary | ICD-10-CM

## 2020-05-15 ENCOUNTER — Telehealth: Payer: Self-pay | Admitting: Nurse Practitioner

## 2020-05-15 NOTE — Telephone Encounter (Signed)
Pt is calling and requesting a call back because she is having trouble reading her dexcom  [phone]

## 2020-05-15 NOTE — Telephone Encounter (Signed)
Called pt, she stated she was able to install her Dexcom G6 app to her phone and applied her sensor and transmitter.

## 2020-05-16 ENCOUNTER — Encounter (HOSPITAL_COMMUNITY): Payer: Self-pay

## 2020-05-16 ENCOUNTER — Ambulatory Visit (HOSPITAL_COMMUNITY): Payer: Medicare HMO

## 2020-05-18 ENCOUNTER — Telehealth: Payer: Self-pay | Admitting: Nurse Practitioner

## 2020-05-18 NOTE — Telephone Encounter (Signed)
Pt states she got a sample from [place] for the dexcom and that the company was supposed to reach out to [place] for some supplies for it, but pt states she does not want to use the dexcom anymore.

## 2020-05-18 NOTE — Telephone Encounter (Signed)
NOTED

## 2020-05-29 ENCOUNTER — Other Ambulatory Visit: Payer: Self-pay | Admitting: "Endocrinology

## 2020-06-13 ENCOUNTER — Other Ambulatory Visit: Payer: Self-pay | Admitting: Nurse Practitioner

## 2020-07-19 ENCOUNTER — Other Ambulatory Visit: Payer: Self-pay | Admitting: "Endocrinology

## 2020-08-02 LAB — LIPID PANEL
Chol/HDL Ratio: 4.7 ratio — ABNORMAL HIGH (ref 0.0–4.4)
Cholesterol, Total: 156 mg/dL (ref 100–199)
HDL: 33 mg/dL — ABNORMAL LOW (ref >39)
LDL Chol Calc (NIH): 90 mg/dL (ref 0–99)
Triglycerides: 193 mg/dL — ABNORMAL HIGH (ref 0–149)
VLDL Cholesterol Cal: 33 mg/dL (ref 5–40)

## 2020-08-02 LAB — COMPREHENSIVE METABOLIC PANEL
ALT: 38 IU/L — ABNORMAL HIGH (ref 0–32)
AST: 44 IU/L — ABNORMAL HIGH (ref 0–40)
Albumin/Globulin Ratio: 1.9 (ref 1.2–2.2)
Albumin: 4.3 g/dL (ref 3.8–4.8)
Alkaline Phosphatase: 77 IU/L (ref 44–121)
BUN/Creatinine Ratio: 14 (ref 12–28)
BUN: 11 mg/dL (ref 8–27)
Bilirubin Total: 0.3 mg/dL (ref 0.0–1.2)
CO2: 23 mmol/L (ref 20–29)
Calcium: 9.4 mg/dL (ref 8.7–10.3)
Chloride: 97 mmol/L (ref 96–106)
Creatinine, Ser: 0.79 mg/dL (ref 0.57–1.00)
Globulin, Total: 2.3 g/dL (ref 1.5–4.5)
Glucose: 74 mg/dL (ref 65–99)
Potassium: 4.3 mmol/L (ref 3.5–5.2)
Sodium: 136 mmol/L (ref 134–144)
Total Protein: 6.6 g/dL (ref 6.0–8.5)
eGFR: 82 mL/min/{1.73_m2} (ref >59)

## 2020-08-02 LAB — T4, FREE: Free T4: 1.14 ng/dL (ref 0.82–1.77)

## 2020-08-02 LAB — TSH: TSH: 3.79 u[IU]/mL (ref 0.450–4.500)

## 2020-08-02 LAB — VITAMIN D 25 HYDROXY (VIT D DEFICIENCY, FRACTURES): Vit D, 25-Hydroxy: 50.7 ng/mL (ref 30.0–100.0)

## 2020-08-06 IMAGING — MG DIGITAL SCREENING BILAT W/ TOMO W/ CAD
6 of 10 series · 6 of 30 positions shown · non-contrast
Comparison: Previous exam(s).

CLINICAL DATA: Screening.

EXAM:
DIGITAL SCREENING BILATERAL MAMMOGRAM WITH TOMO AND CAD

[L CC synth-2D (1 of 2)]
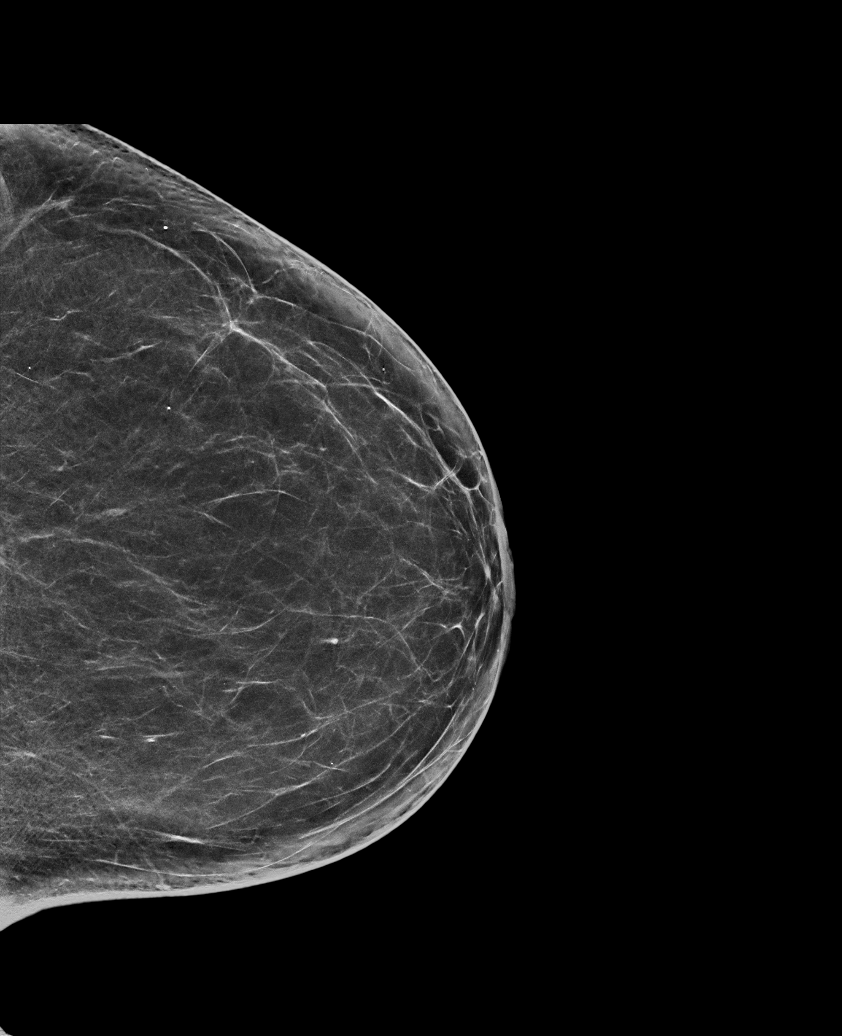

[L CC synth-2D (2 of 2)]
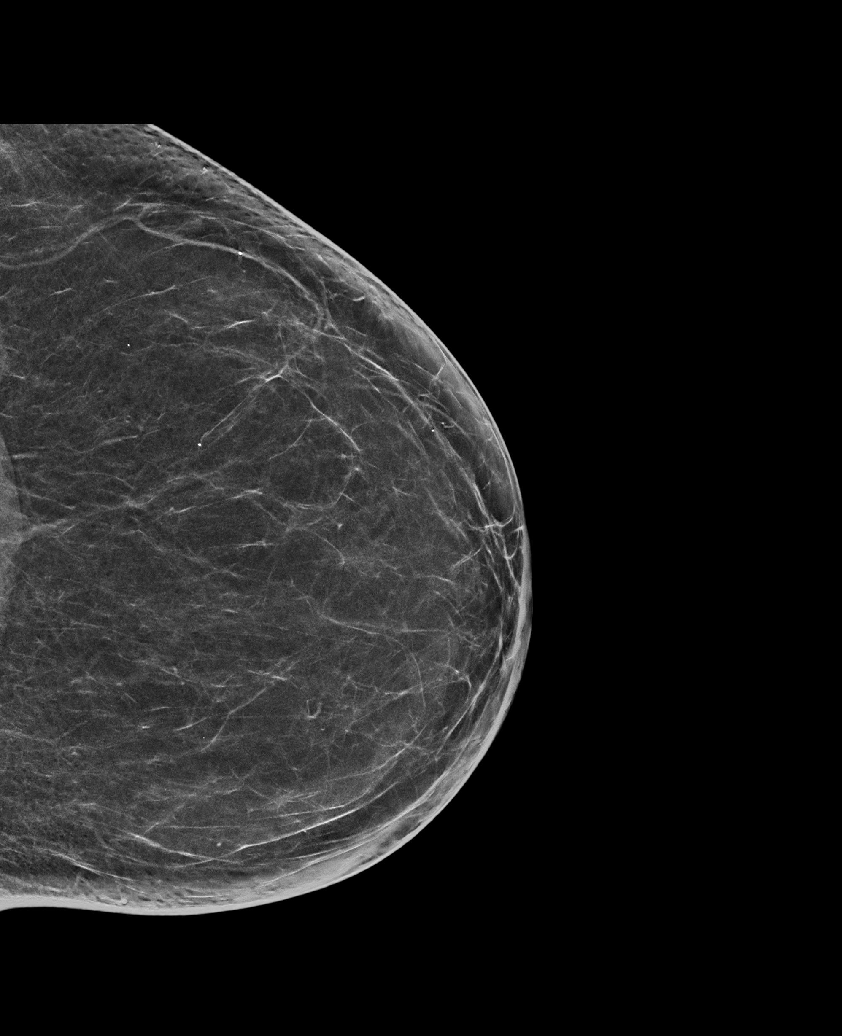

[L MLO synth-2D]
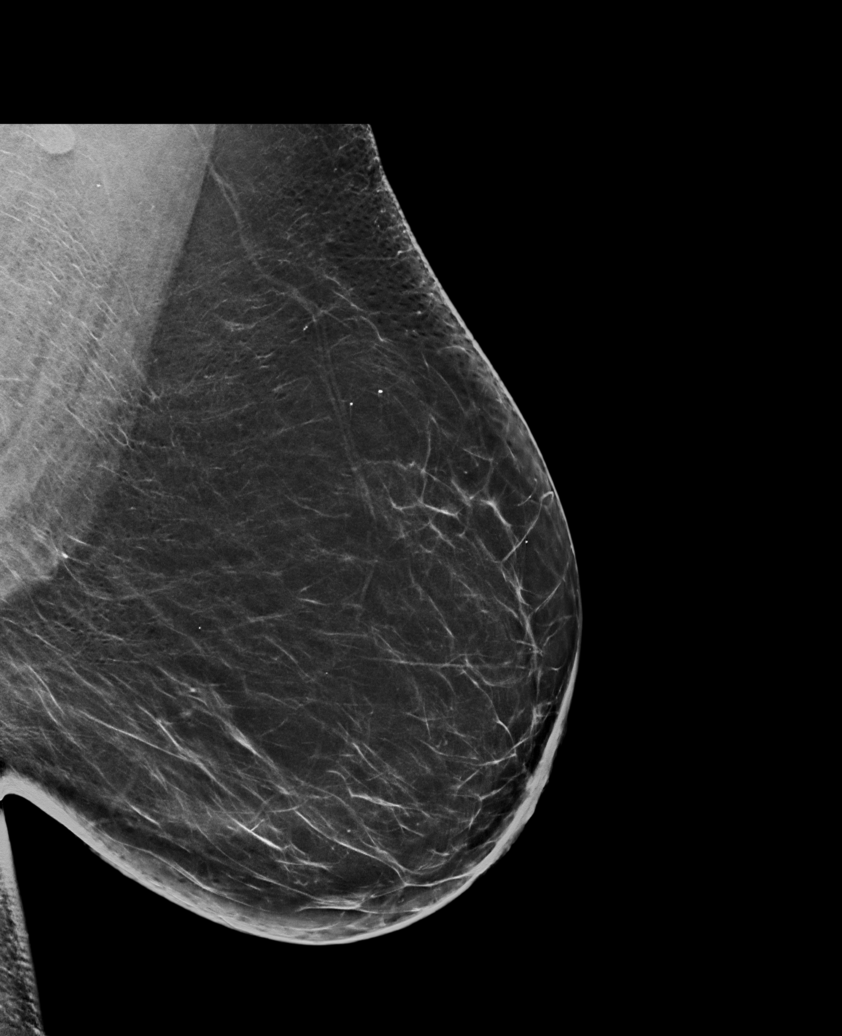

[R CC synth-2D]
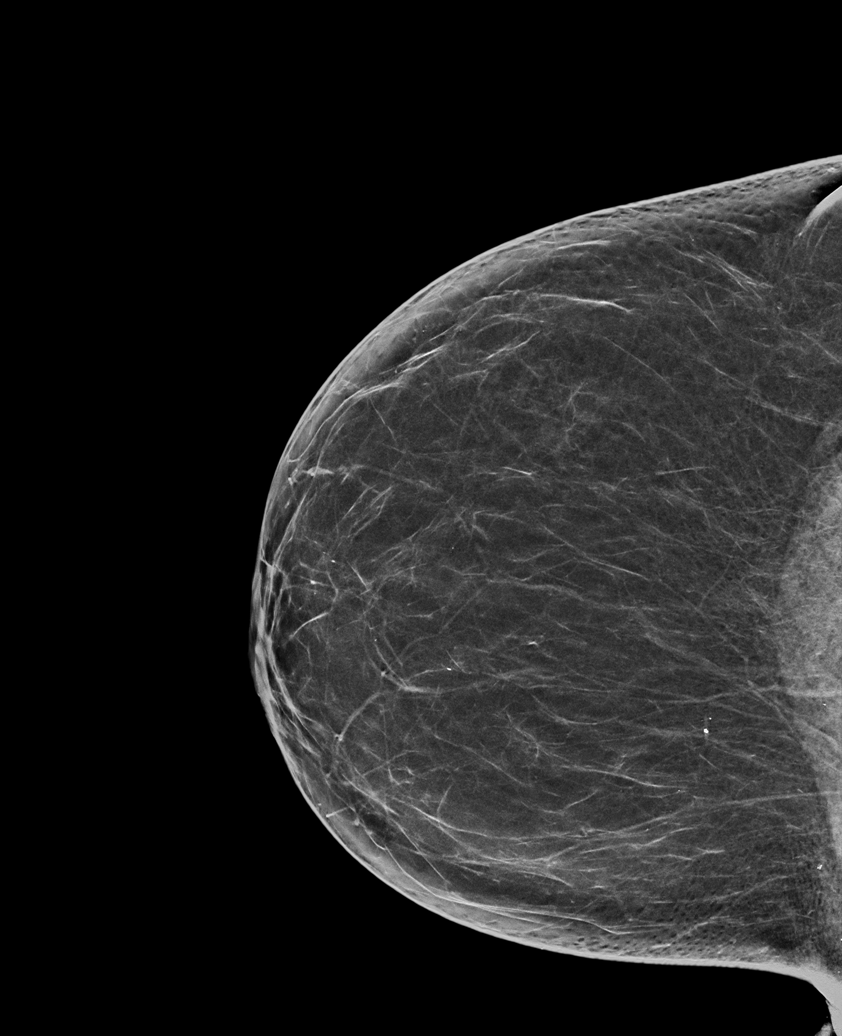

[R MLO synth-2D]
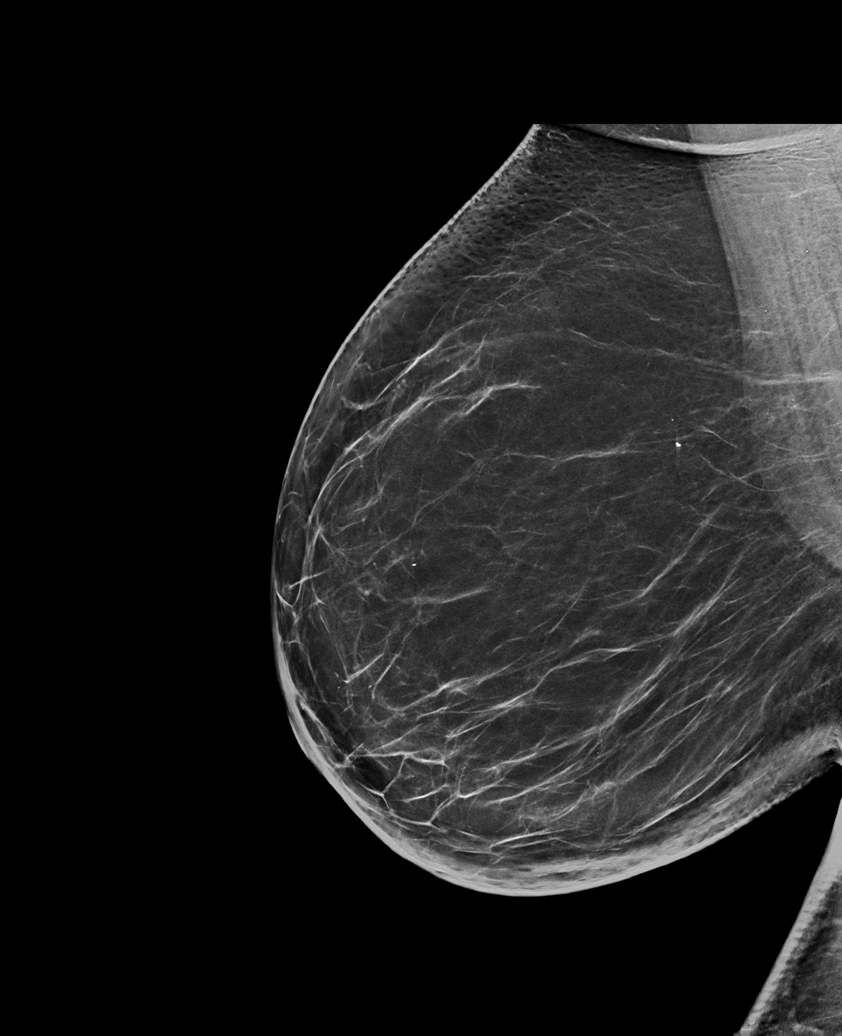

[R MLO tomo · tomo slice 39/76.0]
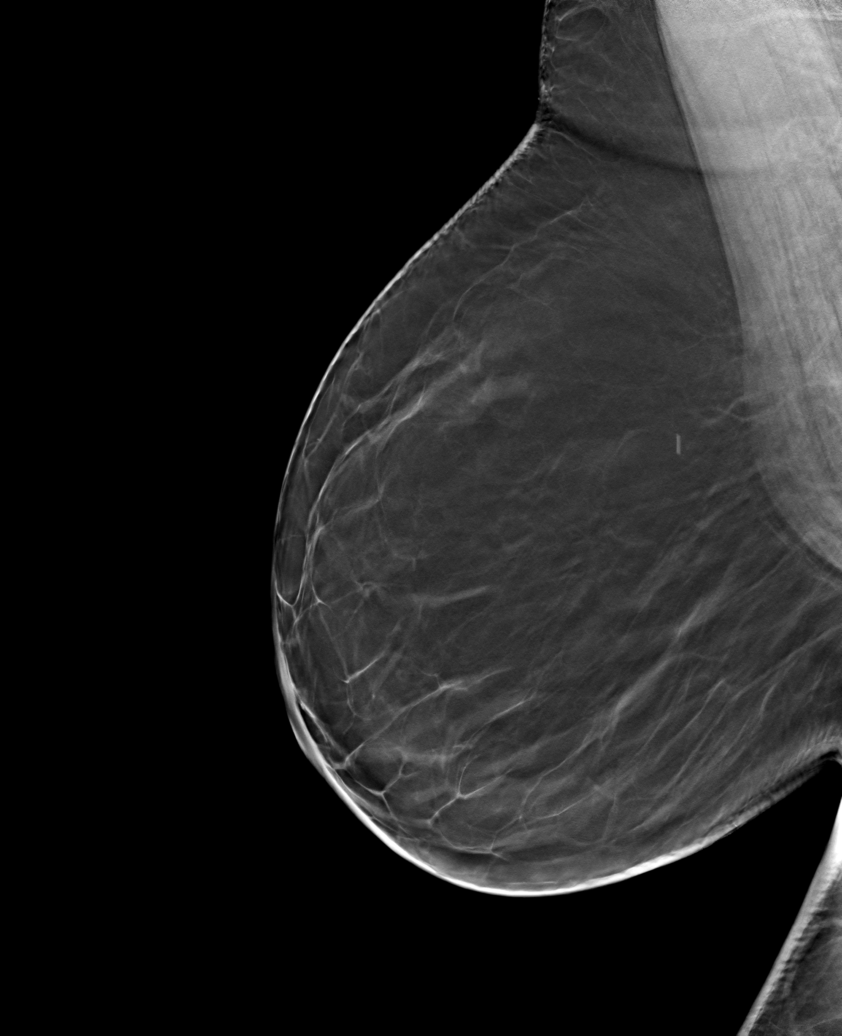

[6 of 30 positions shown; findings below may reference images not displayed]

ACR Breast Density Category b: There are scattered areas of
fibroglandular density.
FINDINGS: There are no findings suspicious for malignancy. Images were
processed with CAD.
IMPRESSION: No mammographic evidence of malignancy. A result letter of this
screening mammogram will be mailed directly to the patient.

RECOMMENDATION:
Screening mammogram in one year. (Code:[id number])

BI-RADS CATEGORY  1: Negative.

## 2020-08-08 ENCOUNTER — Other Ambulatory Visit: Payer: Self-pay

## 2020-08-08 ENCOUNTER — Encounter: Payer: Self-pay | Admitting: Nurse Practitioner

## 2020-08-08 ENCOUNTER — Ambulatory Visit: Payer: Medicare HMO | Admitting: Nurse Practitioner

## 2020-08-08 VITALS — BP 147/90 | HR 98 | Ht 65.0 in | Wt 220.0 lb

## 2020-08-08 DIAGNOSIS — E782 Mixed hyperlipidemia: Secondary | ICD-10-CM

## 2020-08-08 DIAGNOSIS — E559 Vitamin D deficiency, unspecified: Secondary | ICD-10-CM

## 2020-08-08 DIAGNOSIS — E118 Type 2 diabetes mellitus with unspecified complications: Secondary | ICD-10-CM | POA: Diagnosis not present

## 2020-08-08 DIAGNOSIS — I10 Essential (primary) hypertension: Secondary | ICD-10-CM

## 2020-08-08 DIAGNOSIS — E1165 Type 2 diabetes mellitus with hyperglycemia: Secondary | ICD-10-CM | POA: Diagnosis not present

## 2020-08-08 DIAGNOSIS — IMO0002 Reserved for concepts with insufficient information to code with codable children: Secondary | ICD-10-CM

## 2020-08-08 DIAGNOSIS — Z794 Long term (current) use of insulin: Secondary | ICD-10-CM | POA: Diagnosis not present

## 2020-08-08 LAB — POCT UA - MICROALBUMIN
Albumin/Creatinine Ratio, Urine, POC: <30
Creatinine, POC: 200 mg/dL
Microalbumin Ur, POC: 10 mg/L

## 2020-08-08 LAB — POCT GLYCOSYLATED HEMOGLOBIN (HGB A1C): Hemoglobin A1C: 8.7 % — AB (ref 4.0–5.6)

## 2020-08-08 MED ORDER — GLIPIZIDE ER 10 MG PO TB24: 10.0000 mg | ORAL_TABLET | Freq: Every day | ORAL | 3 refills | Status: DC

## 2020-08-08 NOTE — Progress Notes (Signed)
08/08/2020   Endocrinology follow-up note    Subjective:    Patient ID: Melanie Schultz, female    DOB: 06/20/1955,  Melanie Schultz presents today for follow up of uncontrolled type 2 diabetes, hypertension, and hyperlipidemia.  Past Medical History:  Diagnosis Date   Diabetes mellitus without complication ([place])    Multiple sclerosis ([place])    Past Surgical History:  Procedure Laterality Date   BREAST BIOPSY Left    BREAST EXCISIONAL BIOPSY Left    benign   CESAREAN SECTION     NASAL SEPTUM SURGERY     Social History   Socioeconomic History   Marital status: Divorced    Spouse name: Not on file   Number of children: Not on file   Years of education: Not on file   Highest education level: Not on file  Occupational History   Not on file  Tobacco Use   Smoking status: Former    Pack years: 0.00    Types: Cigarettes    Quit date: 02/04/1978    Years since quitting: 42.5   Smokeless tobacco: Never  Vaping Use   Vaping Use: Never used  Substance and Sexual Activity   Alcohol use: No    Alcohol/week: 0.0 standard drinks   Drug use: No   Sexual activity: Not on file  Other Topics Concern   Not on file  Social History Narrative   Not on file   Social Determinants of Health   Financial Resource Strain: Not on file  Food Insecurity: Not on file  Transportation Needs: Not on file  Physical Activity: Not on file  Stress: Not on file  Social Connections: Not on file   Outpatient Encounter Medications as of 08/08/2020  Medication Sig   Accu-Chek Softclix Lancets lancets 100 each by Other route 4 (four) times daily. Use as instructed   ARIPiprazole (ABILIFY) 2 MG tablet Take 2 mg by mouth daily.   atenolol (TENORMIN) 50 MG tablet Take 1 tablet (50 mg total) by mouth 2 (two) times daily.   B-D ULTRAFINE III SHORT PEN 31G X 8 MM MISC USE 1 PEN NEEDLE 4 TIMES DAILY AS DIRECTED   Cholecalciferol (VITAMIN D3) 125 MCG (5000 UT) CAPS Take by mouth daily.   FLUoxetine (PROZAC)  40 MG capsule Take 40 mg by mouth at bedtime.   folic acid-vitamin b complex-vitamin c-selenium-zinc (DIALYVITE) 3 MG TABS tablet Take by mouth.   glipiZIDE (GLUCOTROL XL) 10 MG 24 hr tablet Take 1 tablet (10 mg total) by mouth daily with breakfast.   glucose blood (ACCU-CHEK AVIVA PLUS) test strip TEST 4 TIMES DAILY   hydrochlorothiazide (HYDRODIURIL) 12.5 MG tablet Take 12.5 mg by mouth daily.   insulin glargine, 2 Unit Dial, (TOUJEO MAX SOLOSTAR) 300 UNIT/ML Solostar Pen Inject 110 Units into the skin at bedtime.   losartan (COZAAR) 50 MG tablet Take 50 mg by mouth daily.   metFORMIN (GLUCOPHAGE-XR) 500 MG 24 hr tablet TAKE 2 TABLETS BY MOUTH ONCE DAILY WITH BREAKFAST   methylphenidate (RITALIN) 10 MG tablet Take 20 mg by mouth daily. Take 2 tablets four times daily   NOVOLOG FLEXPEN 100 UNIT/ML FlexPen INJECT 20 TO 26 UNITS SUBCUTANEOUSLY THREE TIMES DAILY WITH MEALS   omeprazole (PRILOSEC) 10 MG capsule Take 10 mg by mouth daily.   Probiotic Product (PROBIOTIC PO) Take by mouth daily.   rosuvastatin (CRESTOR) 20 MG tablet Take 20 mg by mouth daily.   Teriflunomide 14 MG TABS Take by mouth.   UNABLE  TO FIND Med Name: Tumeric, Liver essentials, Evening primrose, iron, Artichoke extract, Milk thistle                      Takes these once daily.   [DISCONTINUED] glipiZIDE (GLUCOTROL XL) 5 MG 24 hr tablet Take 1 tablet by mouth once daily with breakfast   No facility-administered encounter medications on file as of 08/08/2020.   ALLERGIES: Allergies  Allergen Reactions   Lisinopril    VACCINATION STATUS:  There is no immunization history on file for this patient.  Diabetes She presents for her follow-up diabetic visit. She has type 2 diabetes mellitus. Onset time: She was diagnosed at approximate age of 10 years. Her disease course has been stable. There are no hypoglycemic associated symptoms. Pertinent negatives for hypoglycemia include no confusion, headaches, pallor or seizures.  Associated symptoms include foot paresthesias. Pertinent negatives for diabetes include no chest pain, no fatigue, no polydipsia, no polyphagia and no polyuria. There are no hypoglycemic complications. Symptoms are improving. Diabetic complications include nephropathy and peripheral neuropathy. Risk factors for coronary artery disease include dyslipidemia, hypertension, obesity, sedentary lifestyle and diabetes mellitus. Current diabetic treatment includes oral agent (dual therapy) and intensive insulin program. She is compliant with treatment most of the time. Her weight is increasing steadily. She is following a generally unhealthy diet. When asked about meal planning, she reported none. She has had a previous visit with a dietitian. She participates in exercise intermittently. Her home blood glucose trend is fluctuating minimally. Her breakfast blood glucose range is generally 140-180 mg/dl. Her lunch blood glucose range is generally 130-140 mg/dl. Her dinner blood glucose range is generally 180-200 mg/dl. Her bedtime blood glucose range is generally >200 mg/dl. (She presents today with her meter and logs showing above target fasting and postprandial glycemic profile.  Her POCT A1c today is 8.7%, improving slightly from last visit of 8.9%.  Analysis of her meter shows 7-day average of 195, 14-day average of 207, 30-day average of 207, 90-day average of 207.  There are no significant episodes of hypoglycemia documented or reported.) An ACE inhibitor/angiotensin II receptor blocker is being taken. She does not see a podiatrist.Eye exam is current.  Hyperlipidemia This is a chronic problem. The current episode started more than 1 year ago. The problem is uncontrolled. Recent lipid tests were reviewed and are variable. Exacerbating diseases include chronic renal disease, diabetes and obesity. Factors aggravating her hyperlipidemia include fatty foods, beta blockers and thiazides. Pertinent negatives include no  chest pain, myalgias or shortness of breath. Current antihyperlipidemic treatment includes statins. The current treatment provides mild improvement of lipids. Compliance problems include adherence to diet.  Risk factors for coronary artery disease include diabetes mellitus, dyslipidemia, a sedentary lifestyle, post-menopausal, obesity and hypertension.  Hypertension This is a chronic problem. The current episode started more than 1 year ago. The problem has been gradually improving since onset. The problem is controlled. Pertinent negatives include no chest pain, headaches, palpitations or shortness of breath. Agents associated with hypertension include amphetamines. Risk factors for coronary artery disease include diabetes mellitus, dyslipidemia, post-menopausal state, sedentary lifestyle and smoking/tobacco exposure. Past treatments include angiotensin blockers, beta blockers and diuretics. The current treatment provides mild improvement. Compliance problems include diet and exercise.  Hypertensive end-organ damage includes kidney disease. Identifiable causes of hypertension include chronic renal disease.    Review of systems  Constitutional: + Minimally fluctuating body weight,  current Body mass index is 36.61 kg/m. , no fatigue, no subjective  hyperthermia, no subjective hypothermia Eyes: no blurry vision, no xerophthalmia ENT: no sore throat, no nodules palpated in throat, no dysphagia/odynophagia, no hoarseness Cardiovascular: no chest pain, no shortness of breath, no palpitations, no leg swelling Respiratory: no cough, no shortness of breath Gastrointestinal: no nausea/vomiting/diarrhea Musculoskeletal: no muscle/joint aches, + muscle weakness (history of MS) Skin: no rashes, no hyperemia, lesion to back of neck- first noticed 6 weeks ago, itches at times Neurological: no tremors, + numbness/tingling to BLE, no dizziness Psychiatric: no depression, no anxiety    Objective:    BP (!)  147/90   Pulse 98   Ht $R'5\' 5"'gD$  (1.651 m)   Wt 220 lb (99.8 kg)   BMI 36.61 kg/m   Wt Readings from Last 3 Encounters:  08/08/20 220 lb (99.8 kg)  05/04/20 219 lb (99.3 kg)  03/20/20 215 lb (97.5 kg)    BP Readings from Last 3 Encounters:  08/08/20 (!) 147/90  05/04/20 (!) 170/96  03/20/20 (!) 144/91     Physical Exam- Limited  Constitutional:  Body mass index is 36.61 kg/m. , not in acute distress, normal state of mind Eyes:  EOMI, no exophthalmos Neck: Supple Cardiovascular: RRR, no murmurs, rubs, or gallops, no edema Respiratory: Adequate breathing efforts, no crackles, rales, rhonchi, or wheezing Musculoskeletal: no gross deformities, strength intact in all four extremities, no gross restriction of joint movements Skin:  no rashes, no hyperemia, she does have papular lesion to posterior neck which has grown in size over the last 6 weeks. Neurological: no tremor with outstretched hands    Results for orders placed or performed in visit on 08/08/20  HgB A1c  Result Value Ref Range   Hemoglobin A1C 8.7 (A) 4.0 - 5.6 %   HbA1c POC (<> result, manual entry)     HbA1c, POC (prediabetic range)     HbA1c, POC (controlled diabetic range)    POCT UA - Microalbumin  Result Value Ref Range   Microalbumin Ur, POC 10 mg/L   Creatinine, POC 200 mg/dL   Albumin/Creatinine Ratio, Urine, POC <30    Lipid Panel     Component Value Date/Time   CHOL 156 07/31/2020 0834   TRIG 193 (H) 07/31/2020 0834   HDL 33 (L) 07/31/2020 0834   CHOLHDL 4.7 (H) 07/31/2020 0834   CHOLHDL 4.2 06/17/2019 0818   VLDL 57 (H) 09/04/2016 1036   LDLCALC 90 07/31/2020 0834   LDLCALC 90 06/17/2019 0818   Recent Results (from the past 2160 hour(s))  Comprehensive metabolic panel     Status: Abnormal   Collection Time: 07/31/20  8:34 AM  Result Value Ref Range   Glucose 74 65 - 99 mg/dL   BUN 11 8 - 27 mg/dL   Creatinine, Ser 0.79 0.57 - 1.00 mg/dL   eGFR 82 >59 mL/min/1.73   BUN/Creatinine Ratio 14  12 - 28   Sodium 136 134 - 144 mmol/L   Potassium 4.3 3.5 - 5.2 mmol/L   Chloride 97 96 - 106 mmol/L   CO2 23 20 - 29 mmol/L   Calcium 9.4 8.7 - 10.3 mg/dL   Total Protein 6.6 6.0 - 8.5 g/dL   Albumin 4.3 3.8 - 4.8 g/dL   Globulin, Total 2.3 1.5 - 4.5 g/dL   Albumin/Globulin Ratio 1.9 1.2 - 2.2   Bilirubin Total 0.3 0.0 - 1.2 mg/dL   Alkaline Phosphatase 77 44 - 121 IU/L   AST 44 (H) 0 - 40 IU/L   ALT 38 (H) 0 - 32 IU/L  Lipid panel     Status: Abnormal   Collection Time: 07/31/20  8:34 AM  Result Value Ref Range   Cholesterol, Total 156 100 - 199 mg/dL   Triglycerides 193 (H) 0 - 149 mg/dL   HDL 33 (L) >39 mg/dL   VLDL Cholesterol Cal 33 5 - 40 mg/dL   LDL Chol Calc (NIH) 90 0 - 99 mg/dL   Chol/HDL Ratio 4.7 (H) 0.0 - 4.4 ratio    Comment:                                   T. Chol/HDL Ratio                                             Men  Women                               1/2 Avg.Risk  3.4    3.3                                   Avg.Risk  5.0    4.4                                2X Avg.Risk  9.6    7.1                                3X Avg.Risk 23.4   11.0   TSH     Status: None   Collection Time: 07/31/20  8:34 AM  Result Value Ref Range   TSH 3.790 0.450 - 4.500 uIU/mL  T4, free     Status: None   Collection Time: 07/31/20  8:34 AM  Result Value Ref Range   Free T4 1.14 0.82 - 1.77 ng/dL  VITAMIN D 25 Hydroxy (Vit-D Deficiency, Fractures)     Status: None   Collection Time: 07/31/20  8:34 AM  Result Value Ref Range   Vit D, 25-Hydroxy 50.7 30.0 - 100.0 ng/mL    Comment: Vitamin D deficiency has been defined by the [place] and an Endocrine Society practice guideline as a level of serum 25-OH vitamin D less than 20 ng/mL (1,2). The Endocrine Society went on to further define vitamin D insufficiency as a level between 21 and 29 ng/mL (2). 1. IOM (Institute of Medicine). 2010. Dietary reference    intakes for calcium and D. [place]: The     [employer]. 2. Holick MF, Binkley Balfour, Bischoff-Ferrari HA, et al.    Evaluation, treatment, and prevention of vitamin D    deficiency: an Endocrine Society clinical practice    guideline. JCEM. 2011 Jul; 96(7):1911-30.   HgB A1c     Status: Abnormal   Collection Time: 08/08/20  1:38 PM  Result Value Ref Range   Hemoglobin A1C 8.7 (A) 4.0 - 5.6 %   HbA1c POC (<> result, manual entry)     HbA1c, POC (prediabetic range)     HbA1c, POC (controlled diabetic range)    POCT UA - Microalbumin  Status: Normal   Collection Time: 08/08/20  1:39 PM  Result Value Ref Range   Microalbumin Ur, POC 10 mg/L   Creatinine, POC 200 mg/dL   Albumin/Creatinine Ratio, Urine, POC <30      Assessment & Plan:   1) Uncontrolled type 2 diabetes mellitus with complication, with long-term current use of insulin ([place])  She presents today with her meter and logs showing above target fasting and postprandial glycemic profile.  Her POCT A1c today is 8.7%, improving slightly from last visit of 8.9%.  Analysis of her meter shows 7-day average of 195, 14-day average of 207, 30-day average of 207, 90-day average of 207.  There are no significant episodes of hypoglycemia documented or reported.   Recent labs reviewed.  - Patient remains at a high risk for more acute and chronic complications of diabetes which include CAD, CVA, CKD, retinopathy, and neuropathy. These are all discussed in detail with the patient.  - Nutritional counseling repeated at each appointment due to patients tendency to fall back in to old habits.  - The patient admits there is a room for improvement in their diet and drink choices. -  Suggestion is made for the patient to avoid simple carbohydrates from their diet including Cakes, Sweet Desserts / Pastries, Ice Cream, Soda (diet and regular), Sweet Tea, Candies, Chips, Cookies, Sweet Pastries, Store Bought Juices, Alcohol in Excess of 1-2 drinks a day, Artificial Sweeteners,  Coffee Creamer, and "Sugar-free" Products. This will help patient to have stable blood glucose profile and potentially avoid unintended weight gain.   - I encouraged the patient to switch to unprocessed or minimally processed complex starch and increased protein intake (animal or plant source), fruits, and vegetables.   - Patient is advised to stick to a routine mealtimes to eat 3 meals a day and avoid unnecessary snacks (to snack only to correct hypoglycemia).  - I have approached patient with the following individualized plan to manage diabetes and patient agrees.  -Based on her continued hyperglycemia, she will benefit from increase in her Glipizide to 10 mg XL daily with breakfast.  She can continue Metformin 1000 mg ER daily with breakfast, Toujeo 110 units SQ nightly, Novolog 20-26 units TID with meals if glucose is above 90 and she is eating (specific instructions on how to titrate insulin dose based on glucose readings given to patient in writing).   -She is encouraged to continue to monitor blood glucose at least 4 times a day, before meals and at bedtime and report blood glucose readings less than 70 or greater than 200 for 3 tests in a row.  She would have benefited from CGM device but she did not like the Dexcom device.  - Patient specific target  for A1c; LDL, HDL, Triglycerides, and  Waist Circumference were discussed in detail.  2) BP/HTN:  Her blood pressure is not controlled to target.  She is advised to continue Atenolol 50 mg p.o. twice daily, Hydrochlorothiazide 12.5 mg p.o. daily, and Losartan 50 mg p.o. daily.    3) Lipids/HPL: Her most recent lipid panel from 07/31/20 show controlled LDL at 90 and elevated triglycerides of 193.  She is advised to continue Crestor 20 mg po daily at bedtime.  Side effects and precautions discussed with her.    4)  Weight/Diet:  Her Body mass index is 36.61 kg/m. -clearly complicating her diabetes management.  She is a candidate for modest  weight loss.  CDE consult in progress, exercise, and carbohydrates information provided.  5) Chronic Care/Health Maintenance: -Patient is on ACE and Statin medications and is encouraged to continue to follow up with Ophthalmology, Podiatrist at least yearly or according to recommendations, and advised to  stay away from smoking. I have recommended yearly flu vaccine and pneumonia vaccination at least every 5 years; moderate intensity exercise for up to 150 minutes weekly; and  sleep for at least 7 hours a day.  I advised patient to maintain close follow up with her PCP for primary care needs and regarding her skin lesion.     I spent 41 minutes in the care of the patient today including review of labs from [place], Lipids, Thyroid Function, Hematology (current and previous including abstractions from other facilities); face-to-face time discussing  her blood glucose readings/logs, discussing hypoglycemia and hyperglycemia episodes and symptoms, medications doses, her options of short and long term treatment based on the latest standards of care / guidelines;  discussion about incorporating lifestyle medicine;  and documenting the encounter.    Please refer to Patient Instructions for Blood Glucose Monitoring and Insulin/Medications Dosing Guide"  in media tab for additional information. Please  also refer to " Patient Self Inventory" in the Media  tab for reviewed elements of pertinent patient history.  Melanie Schultz participated in the discussions, expressed understanding, and voiced agreement with the above plans.  All questions were answered to her satisfaction. she is encouraged to contact clinic should she have any questions or concerns prior to her return visit.     Follow up plan: Return in about 3 months (around 11/08/2020) for Diabetes F/U with A1c in office, No previsit labs, Bring meter and logs.  Rayetta Pigg, [facility] [address] [place], [place] [address] Phone: [phone] Fax: [phone]  08/08/2020, 1:48 PM

## 2020-08-08 NOTE — Patient Instructions (Signed)

## 2020-08-14 ENCOUNTER — Other Ambulatory Visit: Payer: Self-pay | Admitting: Nurse Practitioner

## 2020-08-14 DIAGNOSIS — IMO0002 Reserved for concepts with insufficient information to code with codable children: Secondary | ICD-10-CM

## 2020-08-14 DIAGNOSIS — E1165 Type 2 diabetes mellitus with hyperglycemia: Secondary | ICD-10-CM

## 2020-08-20 ENCOUNTER — Ambulatory Visit
Admission: EM | Admit: 2020-08-20 | Discharge: 2020-08-20 | Disposition: A | Discharge status: Home / Self Care | Payer: Medicare HMO | Attending: Family Medicine | Admitting: Family Medicine

## 2020-08-20 ENCOUNTER — Encounter: Payer: Self-pay | Admitting: Emergency Medicine

## 2020-08-20 DIAGNOSIS — L819 Disorder of pigmentation, unspecified: Secondary | ICD-10-CM

## 2020-08-20 MED ORDER — BETAMETHASONE VALERATE 0.1 % EX OINT: 1.0000 "application " | TOPICAL_OINTMENT | Freq: Two times a day (BID) | CUTANEOUS | 0 refills | Status: DC

## 2020-08-20 NOTE — ED Provider Notes (Signed)
[facility]    CSN: [id number] Arrival date & time: 08/20/20  0913      History   Chief Complaint No chief complaint on file.   HPI Melanie Schultz is a 65 y.o. female.   HPI Patient with a history of diabetes presents today for evaluation of a pruritic skin lesion on back of the neck x1 month.  She initially thought an insect had bitten her however endorses that the lesion has increased in diameter and continues to be itchy and irritated.  Lesion has not drained and she denies any similar type lesions erupting on her skin.  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   Derangement of posterior horn of medial meniscus 01/31/2020   Diabetic peripheral neuropathy associated with type 2 diabetes mellitus ([place]) 01/31/2020   Fatty liver 01/31/2020   Gastroesophageal reflux disease 01/31/2020   Long term (current) use of insulin ([place]) 01/31/2020   Low back pain 01/31/2020   Mood disorder ([place]) 01/31/2020   Multiple sclerosis ([place]) 01/31/2020   Neurogenic bladder 01/31/2020   Orthopnea 01/31/2020   Other abnormal and inconclusive findings on diagnostic imaging of breast 01/31/2020   Papillomavirus as the cause of diseases classified elsewhere 01/31/2020   Sciatica 01/31/2020   Solitary pulmonary nodule 01/31/2020   Trigger finger 01/31/2020   Vitamin D deficiency 01/31/2020   Sleep apnea 11/25/2019   Fatigue 09/06/2019   Type 2 diabetes mellitus ([place]) 11/24/2014   Benign essential hypertension 11/24/2014   Hyperlipidemia 11/24/2014    Past Surgical History:  Procedure Laterality Date    BREAST BIOPSY Left    BREAST EXCISIONAL BIOPSY Left    benign   CESAREAN SECTION     NASAL SEPTUM SURGERY      OB History   No obstetric history on file.      Home Medications    Prior to Admission medications   Medication Sig Start Date End Date Taking? Authorizing Provider  betamethasone valerate ointment (VALISONE) 0.1 % Apply 1 application topically 2 (two) times daily. Apply to affected skin twice daily for 10-14 days. 08/20/20  Yes Scot Jun, FNP  Accu-Chek Softclix Lancets lancets 100 each by Other route 4 (four) times daily. Use as instructed 09/17/18   Cassandria Anger, MD  ARIPiprazole (ABILIFY) 2 MG tablet Take 2 mg by mouth daily. 09/15/19   [provider]  atenolol (TENORMIN) 50 MG tablet Take 1 tablet (50 mg total) by mouth 2 (two) times daily. 09/18/18   Cassandria Anger, MD  B-D ULTRAFINE III SHORT PEN 31G X 8 MM MISC USE 1 PEN NEEDLE 4 TIMES DAILY AS DIRECTED 07/19/20   Cassandria Anger, MD  Cholecalciferol (VITAMIN D3) 125 MCG (5000 UT) CAPS Take by mouth daily.    [provider]  FLUoxetine (PROZAC) 40 MG capsule Take 40 mg by mouth at bedtime. 09/15/19   [provider]  folic acid-vitamin b complex-vitamin c-selenium-zinc (DIALYVITE) 3 MG TABS tablet Take by mouth. 09/06/19   [provider]  glipiZIDE (GLUCOTROL XL) 10 MG 24 hr tablet Take 1 tablet (10 mg total) by mouth daily with  breakfast. 08/08/20   Brita Romp, NP  glucose blood (ACCU-CHEK AVIVA PLUS) test strip TEST 4 TIMES DAILY 09/07/19   Cassandria Anger, MD  hydrochlorothiazide (HYDRODIURIL) 12.5 MG tablet Take 12.5 mg by mouth daily.    [provider]  insulin glargine, 2 Unit Dial, (TOUJEO MAX SOLOSTAR) 300 UNIT/ML Solostar Pen Inject 110 Units into the skin at bedtime. 02/07/20   Brita Romp, NP  losartan (COZAAR) 50 MG tablet Take 50 mg by mouth daily. 06/18/19   [provider]  metFORMIN (GLUCOPHAGE-XR) 500 MG 24 hr  tablet TAKE 2 TABLETS BY MOUTH ONCE DAILY WITH BREAKFAST 06/13/20   Brita Romp, NP  methylphenidate (RITALIN) 10 MG tablet Take 20 mg by mouth daily. Take 2 tablets four times daily 03/04/15   [provider]  NOVOLOG FLEXPEN 100 UNIT/ML FlexPen INJECT 20 TO 26 UNITS SUBCUTANEOUSLY THREE TIMES DAILY WITH MEALS 08/14/20   Nida, Marella Chimes, MD  omeprazole (PRILOSEC) 10 MG capsule Take 10 mg by mouth daily.    [provider]  Probiotic Product (PROBIOTIC PO) Take by mouth daily.    [provider]  rosuvastatin (CRESTOR) 20 MG tablet Take 20 mg by mouth daily. 05/07/18   [provider]  Teriflunomide 14 MG TABS Take by mouth.    [provider]  UNABLE TO FIND Med Name: Tumeric, Liver essentials, Evening primrose, iron, Artichoke extract, Milk thistle                      Takes these once daily.    [provider]    Family History Family History  Problem Relation Age of Onset   Hypertension Mother    Cancer Mother     Social History Social History   Tobacco Use   Smoking status: Former    Types: Cigarettes    Quit date: 02/04/1978    Years since quitting: 42.5   Smokeless tobacco: Never  Vaping Use   Vaping Use: Never used  Substance Use Topics   Alcohol use: No    Alcohol/week: 0.0 standard drinks   Drug use: No     Allergies   Lisinopril   Review of Systems Review of Systems Pertinent negatives listed in HPI   Physical Exam Triage Vital Signs ED Triage Vitals  Enc Vitals Group     BP 08/20/20 0943 (!) 141/88     Pulse Rate 08/20/20 0943 85     Resp 08/20/20 0943 17     Temp 08/20/20 0943 98.2 F (36.8 C)     Temp Source 08/20/20 0943 Oral     SpO2 08/20/20 0943 96 %     Weight --      Height --      Head Circumference --      Peak Flow --      Pain Score 08/20/20 0942 0     Pain Loc --      Pain Edu? --      Excl. in [place]? --    No data found.  Updated Vital Signs BP (!) 141/88 (BP Location:  Right Arm)   Pulse 85   Temp 98.2 F (36.8 C) (Oral)   Resp 17   SpO2 96%   Visual Acuity Right Eye Distance:   Left Eye Distance:   Bilateral Distance:    Right Eye Near:   Left Eye Near:    Bilateral Near:     Physical Exam Vitals reviewed.  Constitutional:      Appearance: Normal appearance.  HENT:     Head: Normocephalic.  Cardiovascular:     Rate and Rhythm: Normal rate and regular rhythm.  Pulmonary:     Breath sounds: Normal breath sounds.  Musculoskeletal:     Cervical back: Normal range of motion.  Skin:      Neurological:     Mental Status: She is alert.  Psychiatric:        Attention and Perception: Attention normal.        Mood and Affect: Mood is anxious.     UC Treatments / Results  Labs (all labs ordered are listed, but only abnormal results are displayed) Labs Reviewed - No data to display  EKG   Radiology No results found.  Procedures Incision and Drainage  Date/Time: 08/24/2020 7:48 AM Performed by: Scot Jun, FNP Authorized by: Scot Jun, FNP   Consent:    Consent obtained:  Verbal   Consent given by:  Patient   Alternatives discussed:  No treatment Location:    Type:  Cyst   Size:  3 mm   Location:  Neck Pre-procedure details:    Skin preparation:  Povidone-iodine Sedation:    Sedation type:  None Anesthesia:    Anesthesia method:  Topical application Procedure type:    Complexity:  Simple Procedure details:    Incision types:  Stab incision Post-procedure details:    Procedure completion:  Tolerated well, no immediate complications Comments:     Unroofed lesion, no drainage no foreign body.  Dressed with a nonadherent dressing.  Patient tolerated. (including critical care time)  Medications Ordered in UC Medications - No data to display  Initial Impression / Assessment and Plan / UC Course  I have reviewed the triage vital signs and the nursing notes.  Pertinent labs & imaging results that were  available during my care of the patient were reviewed by me and considered in my medical decision making (see chart for details).     Fleshly pigmented skin lesion of unknown etiology.  Unroofed without exudative drainage achieved.  Patient prescribed steroid topical cream.  Patient advised if lesion has not completely resolved following a 59-DJT application of steroid cream follow-up with primary care provider or dermatology as lesion would warrant removal and pathology evaluation. Final Clinical Impressions(s) / UC Diagnoses   Final diagnoses:  Fleshy pigmented skin lesion, posterior neck     Discharge Instructions      Apply small amount of ointment directly to the skin lesion on the back of your neck. If lesion has not completely resolved after 14-day course of treatment follow-up with primary care provider to have lesion excised for skin lesion pathology   ED Prescriptions     Medication Sig Dispense Auth. Provider   betamethasone valerate ointment (VALISONE) 0.1 % Apply 1 application topically 2 (two) times daily. Apply to affected skin twice daily for 10-14 days. 60 g Scot Jun, FNP      PDMP not reviewed this encounter.   Scot Jun, [place] 08/24/20 [phone]

## 2020-08-20 NOTE — ED Triage Notes (Signed)
Pt has bump to top of upper back x 1 month that will not stop itching.

## 2020-08-20 NOTE — Discharge Instructions (Addendum)
Apply small amount of ointment directly to the skin lesion on the back of your neck. If lesion has not completely resolved after 14-day course of treatment follow-up with primary care provider to have lesion excised for skin lesion pathology

## 2020-08-24 DIAGNOSIS — L819 Disorder of pigmentation, unspecified: Secondary | ICD-10-CM | POA: Diagnosis not present

## 2020-08-31 LAB — COLOGUARD

## 2020-09-11 ENCOUNTER — Other Ambulatory Visit: Payer: Self-pay | Admitting: Nurse Practitioner

## 2020-09-11 ENCOUNTER — Telehealth: Payer: Self-pay

## 2020-09-11 MED ORDER — GLUCOSE BLOOD VI STRP: 1.0000 | ORAL_STRIP | Freq: Four times a day (QID) | 2 refills | Status: DC

## 2020-09-11 MED ORDER — ACCU-CHEK AVIVA PLUS VI STRP: ORAL_STRIP | 3 refills | Status: DC

## 2020-09-11 NOTE — Telephone Encounter (Signed)
Patient is requesting refill on Accu Chek Aviva Plus test strips and please send to [facility]

## 2020-09-11 NOTE — Telephone Encounter (Signed)
Rx sent 

## 2020-10-23 ENCOUNTER — Ambulatory Visit: Payer: Medicare HMO | Admitting: Orthopedic Surgery

## 2020-10-23 ENCOUNTER — Other Ambulatory Visit: Payer: Self-pay

## 2020-10-23 ENCOUNTER — Encounter: Payer: Self-pay | Admitting: Orthopedic Surgery

## 2020-10-23 VITALS — BP 145/87 | HR 95 | Ht 64.75 in | Wt 218.2 lb

## 2020-10-23 DIAGNOSIS — M65311 Trigger thumb, right thumb: Secondary | ICD-10-CM

## 2020-10-23 DIAGNOSIS — M65331 Trigger finger, right middle finger: Secondary | ICD-10-CM | POA: Diagnosis not present

## 2020-10-23 NOTE — Progress Notes (Signed)
Chief Complaint  Patient presents with   Hand Pain    Trigger finger//R>L// last time patient was seen she got an injection and said it really didn't help    65 year old female got her second injection right long finger she said it did not help as good as the first 1 which lasted a year  She now has severe pain right thumb with triggering and also has left thumb and left long finger triggering  Examination reveals tenderness over the A1 pulley of the right thumb right long finger with clicking locking and popping  I injected the right long/middle finger and right thumb for trigger finger and come back in 3 weeks to check those and then inject the left long and left thumb  A steroid injection was performed at right thumb using 1% plain Lidocaine and 6 mg of Celestone. This was well tolerated.  A steroid injection was performed at right long finger using 1% plain Lidocaine and 6 mg of Celestone. This was well tolerated.

## 2020-11-08 NOTE — Patient Instructions (Signed)

## 2020-11-09 ENCOUNTER — Encounter: Payer: Self-pay | Admitting: Nurse Practitioner

## 2020-11-09 ENCOUNTER — Ambulatory Visit: Payer: Medicare Other | Admitting: Nurse Practitioner

## 2020-11-09 ENCOUNTER — Other Ambulatory Visit: Payer: Self-pay

## 2020-11-09 VITALS — BP 126/86 | HR 79 | Ht 64.75 in | Wt 217.2 lb

## 2020-11-09 DIAGNOSIS — Z794 Long term (current) use of insulin: Secondary | ICD-10-CM | POA: Diagnosis not present

## 2020-11-09 DIAGNOSIS — I10 Essential (primary) hypertension: Secondary | ICD-10-CM | POA: Diagnosis not present

## 2020-11-09 DIAGNOSIS — E1169 Type 2 diabetes mellitus with other specified complication: Secondary | ICD-10-CM

## 2020-11-09 DIAGNOSIS — E559 Vitamin D deficiency, unspecified: Secondary | ICD-10-CM

## 2020-11-09 DIAGNOSIS — E782 Mixed hyperlipidemia: Secondary | ICD-10-CM

## 2020-11-09 LAB — POCT GLYCOSYLATED HEMOGLOBIN (HGB A1C): HbA1c, POC (controlled diabetic range): 8.4 % — AB (ref 0.0–7.0)

## 2020-11-09 NOTE — Progress Notes (Signed)
11/09/2020   Endocrinology follow-up note    Subjective:    Patient ID: Barbarann Ehlers, female    DOB: [birth year],  Barbarann Ehlers presents today for follow up of uncontrolled type 2 diabetes, hypertension, and hyperlipidemia.  Past Medical History:  Diagnosis Date   Diabetes mellitus without complication ([place])    Multiple sclerosis ([place])    Past Surgical History:  Procedure Laterality Date   BREAST BIOPSY Left    BREAST EXCISIONAL BIOPSY Left    benign   CESAREAN SECTION     NASAL SEPTUM SURGERY     Social History   Socioeconomic History   Marital status: Divorced    Spouse name: Not on file   Number of children: Not on file   Years of education: Not on file   Highest education level: Not on file  Occupational History   Not on file  Tobacco Use   Smoking status: Former    Types: Cigarettes    Quit date: 02/04/1978    Years since quitting: 42.7   Smokeless tobacco: Never  Vaping Use   Vaping Use: Never used  Substance and Sexual Activity   Alcohol use: No    Alcohol/week: 0.0 standard drinks   Drug use: No   Sexual activity: Not on file  Other Topics Concern   Not on file  Social History Narrative   Not on file   Social Determinants of Health   Financial Resource Strain: Not on file  Food Insecurity: Not on file  Transportation Needs: Not on file  Physical Activity: Not on file  Stress: Not on file  Social Connections: Not on file   Outpatient Encounter Medications as of 11/09/2020  Medication Sig   Accu-Chek Softclix Lancets lancets 100 each by Other route 4 (four) times daily. Use as instructed   ARIPiprazole (ABILIFY) 2 MG tablet Take 2 mg by mouth daily.   atenolol (TENORMIN) 50 MG tablet Take 1 tablet (50 mg total) by mouth 2 (two) times daily.   B-D ULTRAFINE III SHORT PEN 31G X 8 MM MISC USE 1 PEN NEEDLE 4 TIMES DAILY AS DIRECTED   betamethasone valerate ointment (VALISONE) 0.1 % Apply 1 application topically 2 (two) times daily. Apply to  affected skin twice daily for 10-14 days.   Cholecalciferol (VITAMIN D3) 125 MCG (5000 UT) CAPS Take by mouth daily.   FLUoxetine (PROZAC) 40 MG capsule Take 40 mg by mouth at bedtime.   folic acid-vitamin b complex-vitamin c-selenium-zinc (DIALYVITE) 3 MG TABS tablet Take by mouth.   glipiZIDE (GLUCOTROL XL) 10 MG 24 hr tablet Take 1 tablet (10 mg total) by mouth daily with breakfast.   glucose blood (ACCU-CHEK AVIVA PLUS) test strip TEST 4 TIMES DAILY DX : E11.65   hydrochlorothiazide (HYDRODIURIL) 12.5 MG tablet Take 12.5 mg by mouth daily.   insulin glargine, 2 Unit Dial, (TOUJEO MAX SOLOSTAR) 300 UNIT/ML Solostar Pen Inject 110 Units into the skin at bedtime.   losartan (COZAAR) 50 MG tablet Take 50 mg by mouth daily.   metFORMIN (GLUCOPHAGE-XR) 500 MG 24 hr tablet TAKE 2 TABLETS BY MOUTH ONCE DAILY WITH BREAKFAST   methylphenidate (RITALIN) 10 MG tablet Take 20 mg by mouth daily. Take 2 tablets four times daily   NOVOLOG FLEXPEN 100 UNIT/ML FlexPen INJECT 20 TO 26 UNITS SUBCUTANEOUSLY THREE TIMES DAILY WITH MEALS   omeprazole (PRILOSEC) 20 MG capsule Take 20 mg by mouth daily.   Probiotic Product (PROBIOTIC PO) Take by mouth daily.   rosuvastatin (  CRESTOR) 20 MG tablet Take 20 mg by mouth daily.   Teriflunomide 14 MG TABS Take by mouth.   UNABLE TO FIND Med Name: Tumeric, Liver essentials, Evening primrose, iron, Artichoke extract, Milk thistle                      Takes these once daily.   No facility-administered encounter medications on file as of 11/09/2020.   ALLERGIES: Allergies  Allergen Reactions   Lisinopril    VACCINATION STATUS:  There is no immunization history on file for this patient.  Diabetes She presents for her follow-up diabetic visit. She has type 2 diabetes mellitus. Onset time: She was diagnosed at approximate age of 34 years. Her disease course has been improving. There are no hypoglycemic associated symptoms. Pertinent negatives for hypoglycemia include no  confusion, headaches, pallor or seizures. Associated symptoms include foot paresthesias. Pertinent negatives for diabetes include no chest pain, no fatigue, no polydipsia, no polyphagia and no polyuria. There are no hypoglycemic complications. Symptoms are stable. Diabetic complications include nephropathy and peripheral neuropathy. Risk factors for coronary artery disease include dyslipidemia, hypertension, obesity, sedentary lifestyle and diabetes mellitus. Current diabetic treatment includes oral agent (dual therapy) and intensive insulin program. She is compliant with treatment most of the time. Her weight is fluctuating minimally. She is following a generally unhealthy diet. When asked about meal planning, she reported none. She has had a previous visit with a dietitian. She participates in exercise intermittently. Her home blood glucose trend is fluctuating minimally. Her breakfast blood glucose range is generally 110-130 mg/dl. Her lunch blood glucose range is generally 140-180 mg/dl. Her dinner blood glucose range is generally 180-200 mg/dl. Her bedtime blood glucose range is generally >200 mg/dl. (She presents today with her meter and logs showing near target fasting and above target postprandial glycemic profile.  Her POCT A1c today is 8.4%, improving slightly from last visit of 8.7%.  She reports she still struggles later in the day with snacking.  She denies any hypoglycemia.) An ACE inhibitor/angiotensin II receptor blocker is being taken. She does not see a podiatrist.Eye exam is current.  Hyperlipidemia This is a chronic problem. The current episode started more than 1 year ago. The problem is uncontrolled. Recent lipid tests were reviewed and are variable. Exacerbating diseases include chronic renal disease, diabetes and obesity. Factors aggravating her hyperlipidemia include fatty foods, beta blockers and thiazides. Pertinent negatives include no chest pain, myalgias or shortness of breath.  Current antihyperlipidemic treatment includes statins. The current treatment provides mild improvement of lipids. Compliance problems include adherence to diet.  Risk factors for coronary artery disease include diabetes mellitus, dyslipidemia, a sedentary lifestyle, post-menopausal, obesity and hypertension.  Hypertension This is a chronic problem. The current episode started more than 1 year ago. The problem has been gradually improving since onset. The problem is controlled. Pertinent negatives include no chest pain, headaches, palpitations or shortness of breath. Agents associated with hypertension include amphetamines. Risk factors for coronary artery disease include diabetes mellitus, dyslipidemia, post-menopausal state, sedentary lifestyle and smoking/tobacco exposure. Past treatments include angiotensin blockers, beta blockers and diuretics. The current treatment provides mild improvement. Compliance problems include diet and exercise.  Hypertensive end-organ damage includes kidney disease. Identifiable causes of hypertension include chronic renal disease.    Review of systems  Constitutional: + Minimally fluctuating body weight,  current Body mass index is 36.42 kg/m. , no fatigue, no subjective hyperthermia, no subjective hypothermia Eyes: no blurry vision, no xerophthalmia ENT: no  sore throat, no nodules palpated in throat, no dysphagia/odynophagia, no hoarseness Cardiovascular: no chest pain, no shortness of breath, no palpitations, no leg swelling Respiratory: no cough, no shortness of breath Gastrointestinal: no nausea/vomiting/diarrhea Musculoskeletal: no muscle/joint aches, + muscle weakness (history of MS) Skin: no rashes, no hyperemia, Neurological: no tremors, + numbness/tingling to BLE (esp bottom of feet), no dizziness Psychiatric: no depression, no anxiety    Objective:    BP 126/86   Pulse 79   Ht 5' 4.75" (1.645 m)   Wt 217 lb 3.2 oz (98.5 kg)   SpO2 97%   BMI 36.42  kg/m   Wt Readings from Last 3 Encounters:  11/09/20 217 lb 3.2 oz (98.5 kg)  10/23/20 218 lb 3.2 oz (99 kg)  08/08/20 220 lb (99.8 kg)    BP Readings from Last 3 Encounters:  11/09/20 126/86  10/23/20 (!) 145/87  08/20/20 (!) 141/88    Physical Exam- Limited  Constitutional:  Body mass index is 36.42 kg/m. , not in acute distress, normal state of mind Eyes:  EOMI, no exophthalmos Neck: Supple Cardiovascular: RRR, no murmurs, rubs, or gallops, no edema Respiratory: Adequate breathing efforts, no crackles, rales, rhonchi, or wheezing Musculoskeletal: no gross deformities, strength intact in all four extremities, no gross restriction of joint movements Skin:  no rashes, no hyperemia Neurological: no tremor with outstretched hands    Results for orders placed or performed in visit on 11/09/20  POCT glycosylated hemoglobin (Hb A1C)  Result Value Ref Range   Hemoglobin A1C     HbA1c POC (<> result, manual entry)     HbA1c, POC (prediabetic range)     HbA1c, POC (controlled diabetic range) 8.4 (A) 0.0 - 7.0 %   Lipid Panel     Component Value Date/Time   CHOL 156 07/31/2020 0834   TRIG 193 (H) 07/31/2020 0834   HDL 33 (L) 07/31/2020 0834   CHOLHDL 4.7 (H) 07/31/2020 0834   CHOLHDL 4.2 06/17/2019 0818   VLDL 57 (H) 09/04/2016 1036   LDLCALC 90 07/31/2020 0834   LDLCALC 90 06/17/2019 0818   Recent Results (from the past 2160 hour(s))  POCT glycosylated hemoglobin (Hb A1C)     Status: Abnormal   Collection Time: 11/09/20 10:07 AM  Result Value Ref Range   Hemoglobin A1C     HbA1c POC (<> result, manual entry)     HbA1c, POC (prediabetic range)     HbA1c, POC (controlled diabetic range) 8.4 (A) 0.0 - 7.0 %     Assessment & Plan:   1) Uncontrolled type 2 diabetes mellitus with complication, with long-term current use of insulin ([place])  She presents today with her meter and logs showing near target fasting and above target postprandial glycemic profile.  Her POCT A1c  today is 8.4%, improving slightly from last visit of 8.7%.  She reports she still struggles later in the day with snacking.  She denies any hypoglycemia.  Analysis of her meter shows 7-day average of 172, 14-day average of 194, 30-day average of 201.   Recent labs reviewed showing elevated liver function tests (following up with PCP about this).  - Patient remains at a high risk for more acute and chronic complications of diabetes which include CAD, CVA, CKD, retinopathy, and neuropathy. These are all discussed in detail with the patient.  - Nutritional counseling repeated at each appointment due to patients tendency to fall back in to old habits.  - The patient admits there is a room for improvement  in their diet and drink choices. -  Suggestion is made for the patient to avoid simple carbohydrates from their diet including Cakes, Sweet Desserts / Pastries, Ice Cream, Soda (diet and regular), Sweet Tea, Candies, Chips, Cookies, Sweet Pastries, Store Bought Juices, Alcohol in Excess of 1-2 drinks a day, Artificial Sweeteners, Coffee Creamer, and "Sugar-free" Products. This will help patient to have stable blood glucose profile and potentially avoid unintended weight gain.   - I encouraged the patient to switch to unprocessed or minimally processed complex starch and increased protein intake (animal or plant source), fruits, and vegetables.   - Patient is advised to stick to a routine mealtimes to eat 3 meals a day and avoid unnecessary snacks (to snack only to correct hypoglycemia).  - I have approached patient with the following individualized plan to manage diabetes and patient agrees.  -Given her improved glycemic profile, she is advised to continue her current regimen of Toujeo 110 units SQ nightly, Novolog 20-26 units TID with meals (Specific instructions on how to titrate insulin dosage based on glucose readings given to patient in writing), Glipizide 10 mg XL daily with breakfast, and  Metformin 1000 mg ER daily with breakfast.     -She is encouraged to continue to monitor blood glucose at least 4 times a day, before meals and at bedtime and report blood glucose readings less than 70 or greater than 200 for 3 tests in a row.  She would have benefited from CGM device but she did not like the Dexcom device.  - Patient specific target  for A1c; LDL, HDL, Triglycerides, and  Waist Circumference were discussed in detail.  2) BP/HTN:  Her blood pressure is controlled to target.  She is advised to continue Atenolol 50 mg p.o. twice daily, Hydrochlorothiazide 12.5 mg p.o. daily, and Losartan 50 mg p.o. daily.    3) Lipids/HPL: Her most recent lipid panel from 07/31/20 show controlled LDL at 90 and elevated triglycerides of 193.  She is advised to continue Crestor 20 mg po daily at bedtime.  Side effects and precautions discussed with her.    4)  Weight/Diet:  Her Body mass index is 36.42 kg/m. -clearly complicating her diabetes management.  She is a candidate for modest weight loss.  CDE consult in progress, exercise, and carbohydrates information provided.  5) Chronic Care/Health Maintenance: -Patient is on ACE and Statin medications and is encouraged to continue to follow up with Ophthalmology, Podiatrist at least yearly or according to recommendations, and advised to  stay away from smoking. I have recommended yearly flu vaccine and pneumonia vaccination at least every 5 years; moderate intensity exercise for up to 150 minutes weekly; and  sleep for at least 7 hours a day.  I advised patient to maintain close follow up with her PCP for primary care needs and regarding her skin lesion.      I spent 30 minutes in the care of the patient today including review of labs from [facility], Lipids, Thyroid Function, Hematology (current and previous including abstractions from other facilities); face-to-face time discussing  her blood glucose readings/logs, discussing hypoglycemia and  hyperglycemia episodes and symptoms, medications doses, her options of short and long term treatment based on the latest standards of care / guidelines;  discussion about incorporating lifestyle medicine;  and documenting the encounter.    Please refer to Patient Instructions for Blood Glucose Monitoring and Insulin/Medications Dosing Guide"  in media tab for additional information. Please  also refer to " Patient Self  Inventory" in the Media  tab for reviewed elements of pertinent patient history.  Royetta Crochet Lothrop participated in the discussions, expressed understanding, and voiced agreement with the above plans.  All questions were answered to her satisfaction. she is encouraged to contact clinic should she have any questions or concerns prior to her return visit.     Follow up plan: Return in about 4 months (around 03/12/2021) for Diabetes F/U with A1c in office, Previsit labs, Bring meter and logs.  Rayetta Pigg, [facility] [address] [place], [place] [address] Phone: [phone] Fax: [phone]  11/09/2020, 10:18 AM

## 2020-11-13 ENCOUNTER — Other Ambulatory Visit: Payer: Self-pay

## 2020-11-13 ENCOUNTER — Ambulatory Visit (INDEPENDENT_AMBULATORY_CARE_PROVIDER_SITE_OTHER): Payer: Medicare Other | Admitting: Orthopedic Surgery

## 2020-11-13 DIAGNOSIS — M65332 Trigger finger, left middle finger: Secondary | ICD-10-CM

## 2020-11-13 NOTE — Progress Notes (Signed)
Chief Complaint  Patient presents with   Hand Pain    L/middle finger, locking here for injection   Left long finger ejection for triggering  Medication Celestone 6 1% lidocaine 2 cc Alcohol ethyl chloride Permission given Timeout taken Medication injected  Follow-up as needed

## 2020-11-13 NOTE — Patient Instructions (Signed)

## 2020-11-21 ENCOUNTER — Telehealth: Payer: Self-pay

## 2020-11-21 ENCOUNTER — Other Ambulatory Visit: Payer: Self-pay

## 2020-11-21 MED ORDER — ACCU-CHEK AVIVA PLUS VI STRP: ORAL_STRIP | 3 refills | Status: DC

## 2020-11-21 NOTE — Telephone Encounter (Signed)
Test strips have been sent to [facility] for the patient.

## 2020-11-21 NOTE — Telephone Encounter (Signed)
Patient is requesting a refill on her test strips. She now has [employer] This needs to go to [facility].   [employer] Medicare - ID [id number]

## 2020-12-07 ENCOUNTER — Other Ambulatory Visit: Payer: Self-pay

## 2020-12-07 DIAGNOSIS — E118 Type 2 diabetes mellitus with unspecified complications: Secondary | ICD-10-CM

## 2020-12-07 MED ORDER — TOUJEO MAX SOLOSTAR 300 UNIT/ML ~~LOC~~ SOPN: 110.0000 [IU] | PEN_INJECTOR | Freq: Every day | SUBCUTANEOUS | 0 refills | Status: DC

## 2020-12-14 ENCOUNTER — Other Ambulatory Visit: Payer: Self-pay | Admitting: "Endocrinology

## 2020-12-26 DIAGNOSIS — R42 Dizziness and giddiness: Secondary | ICD-10-CM | POA: Insufficient documentation

## 2020-12-28 ENCOUNTER — Other Ambulatory Visit: Payer: Self-pay | Admitting: "Endocrinology

## 2021-01-18 ENCOUNTER — Other Ambulatory Visit: Payer: Self-pay | Admitting: Nurse Practitioner

## 2021-01-18 DIAGNOSIS — E118 Type 2 diabetes mellitus with unspecified complications: Secondary | ICD-10-CM

## 2021-02-08 ENCOUNTER — Other Ambulatory Visit: Payer: Self-pay | Admitting: "Endocrinology

## 2021-02-15 ENCOUNTER — Other Ambulatory Visit: Payer: Self-pay

## 2021-02-15 ENCOUNTER — Telehealth: Payer: Self-pay | Admitting: Nurse Practitioner

## 2021-02-15 MED ORDER — INSULIN LISPRO (1 UNIT DIAL) 100 UNIT/ML (KWIKPEN): 20.0000 [IU] | PEN_INJECTOR | Freq: Three times a day (TID) | SUBCUTANEOUS | 2 refills | Status: DC

## 2021-02-15 NOTE — Telephone Encounter (Signed)
Pt left a message stating her Novolog was denied by insurance and thinks she needs to be switched back to Humalog and also wants to discuss being switched off Toujeo. Requesting cb [phone]

## 2021-02-15 NOTE — Telephone Encounter (Signed)
I called the patient and she stated that she needs Humalog per her insurance now. I let the patient know that I have sent in a new prescription to her pharmacy. Patient verbalized an understanding.  Patient also stated that she wants to discuss with Whitney about taking Ozempic or something similar to help her loose weight at her next appointment. Sending as a reminder to discuss on 03/13/2021.

## 2021-02-20 ENCOUNTER — Ambulatory Visit: Payer: Medicare Other | Admitting: Dermatology

## 2021-02-20 ENCOUNTER — Other Ambulatory Visit: Payer: Self-pay

## 2021-02-20 ENCOUNTER — Encounter: Payer: Medicare Other | Admitting: Dermatology

## 2021-02-20 DIAGNOSIS — L821 Other seborrheic keratosis: Secondary | ICD-10-CM

## 2021-02-20 DIAGNOSIS — L820 Inflamed seborrheic keratosis: Secondary | ICD-10-CM | POA: Diagnosis not present

## 2021-02-20 DIAGNOSIS — L814 Other melanin hyperpigmentation: Secondary | ICD-10-CM

## 2021-02-20 DIAGNOSIS — D229 Melanocytic nevi, unspecified: Secondary | ICD-10-CM

## 2021-02-20 DIAGNOSIS — L700 Acne vulgaris: Secondary | ICD-10-CM

## 2021-02-20 DIAGNOSIS — L578 Other skin changes due to chronic exposure to nonionizing radiation: Secondary | ICD-10-CM

## 2021-02-20 DIAGNOSIS — L570 Actinic keratosis: Secondary | ICD-10-CM | POA: Diagnosis not present

## 2021-02-20 DIAGNOSIS — Z1283 Encounter for screening for malignant neoplasm of skin: Secondary | ICD-10-CM | POA: Diagnosis not present

## 2021-02-20 DIAGNOSIS — L565 Disseminated superficial actinic porokeratosis (DSAP): Secondary | ICD-10-CM | POA: Diagnosis not present

## 2021-02-20 DIAGNOSIS — D225 Melanocytic nevi of trunk: Secondary | ICD-10-CM

## 2021-02-20 DIAGNOSIS — D1800 Hemangioma unspecified site: Secondary | ICD-10-CM

## 2021-02-20 NOTE — Progress Notes (Signed)
New Patient Visit  Subjective  Melanie Schultz is a 66 y.o. female who presents for the following: Annual Exam.  New patient presents for TBSE. The patient has spots, moles and lesions to be evaluated, some may be new or changing. Patient has dry spots on her arms, legs, forehead, chest she would like looked at. She has tried betamethasone ointment, but not consistent. She scratches at areas. No history of skin cancer.    The following portions of the chart were reviewed this encounter and updated as appropriate:       Review of Systems:  No other skin or systemic complaints except as noted in HPI or Assessment and Plan.  Objective  Well appearing patient in no apparent distress; mood and affect are within normal limits.  A full examination was performed including scalp, head, eyes, ears, nose, lips, neck, chest, axillae, abdomen, back, buttocks, bilateral upper extremities, bilateral lower extremities, hands, feet, fingers, toes, fingernails, and toenails. All findings within normal limits unless otherwise noted below.  L upper forehead x 5, R temple x 1, R mandible x 1, R intermammary chest x 17, R eyelid x 1 (25) Erythematous stuck-on, waxy papules  lateral periocular area Open and closed comedones  R eyebrow x 1 Pink scaly macule  central upper abdomen 9.47mm brown macule  arms, legs Pink brown scaly macules with keratotic rim.    Assessment & Plan  Skin cancer screening performed today.  Actinic Damage - chronic, secondary to cumulative UV radiation exposure/sun exposure over time - diffuse scaly erythematous macules with underlying dyspigmentation - Recommend daily broad spectrum sunscreen SPF 30+ to sun-exposed areas, reapply every 2 hours as needed.  - Recommend staying in the shade or wearing long sleeves, sun glasses (UVA+UVB protection) and wide brim hats (4-inch brim around the entire circumference of the hat). - Call for new or changing  lesions.  Lentigines - Scattered tan macules - Due to sun exposure - Benign-appering, observe - Recommend daily broad spectrum sunscreen SPF 30+ to sun-exposed areas, reapply every 2 hours as needed. - Call for any changes  Seborrheic Keratoses - Stuck-on, waxy, tan-brown papules and/or plaques  - Benign-appearing - Discussed benign etiology and prognosis. - Observe - Call for any changes  Inflamed seborrheic keratosis (25) L upper forehead x 5, R temple x 1, R mandible x 1, R intermammary chest x 17, R eyelid x 1  Destruction of lesion - L upper forehead x 5, R temple x 1, R mandible x 1, R intermammary chest x 17, R eyelid x 1  Destruction method: cryotherapy   Informed consent: discussed and consent obtained   Lesion destroyed using liquid nitrogen: Yes   Region frozen until ice ball extended beyond lesion: Yes   Outcome: patient tolerated procedure well with no complications   Post-procedure details: wound care instructions given   Additional details:  Prior to procedure, discussed risks of blister formation, small wound, skin dyspigmentation, or rare scar following cryotherapy. Recommend Vaseline ointment to treated areas while healing.   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 Patient states she has tried tretinoin in the past with no improvement. She prefers to observe for now.  Recommend daily broad spectrum sunscreen SPF 30+ to sun-exposed areas, reapply every 2 hours as needed. Call for new or changing lesions.  Staying in the shade or wearing long sleeves, sun glasses (UVA+UVB protection) and wide brim hats (4-inch brim around  the entire circumference of the hat) are also recommended for sun protection.    AK (actinic keratosis) R eyebrow x 1  Actinic keratoses are precancerous spots that appear secondary to cumulative UV radiation exposure/sun exposure over time. They are chronic  with expected duration over 1 year. A portion of actinic keratoses will progress to squamous cell carcinoma of the skin. It is not possible to reliably predict which spots will progress to skin cancer and so treatment is recommended to prevent development of skin cancer.  Recommend daily broad spectrum sunscreen SPF 30+ to sun-exposed areas, reapply every 2 hours as needed.  Recommend staying in the shade or wearing long sleeves, sun glasses (UVA+UVB protection) and wide brim hats (4-inch brim around the entire circumference of the hat). Call for new or changing lesions.  Destruction of lesion - R eyebrow x 1  Destruction method: cryotherapy   Informed consent: discussed and consent obtained   Lesion destroyed using liquid nitrogen: Yes   Region frozen until ice ball extended beyond lesion: Yes   Outcome: patient tolerated procedure well with no complications   Post-procedure details: wound care instructions given   Additional details:  Prior to procedure, discussed risks of blister formation, small wound, skin dyspigmentation, or rare scar following cryotherapy. Recommend Vaseline ointment to treated areas while healing.   Nevus central upper abdomen  Benign-appearing.  Observation.  Call clinic for new or changing moles.  Recommend daily use of broad spectrum spf 30+ sunscreen to sun-exposed areas.   DSAP (disseminated superficial actinic porokeratosis) arms, legs  Chronic inherited condition of sun-exposed skin, most commonly arms and legs.  Difficult to treat.  Recommend photoprotection and regular use of spf 30 or higher sunscreen to prevent worsening of condition and precancerous changes.  Start Cholesterol 2% Lovastatin 2% Cream 240 gm- Apply twice daily as directed to affected areas arms and legs 4Rf.  Sent to [facility]    Hemangiomas - Red papules - Discussed benign nature - Observe - Call for any changes  Melanocytic Nevi - Tan-brown and/or pink-flesh-colored  symmetric macules and papules, including left abdomen - Benign appearing on exam today - Observation - Call clinic for new or changing moles - Recommend daily use of broad spectrum spf 30+ sunscreen to sun-exposed areas.    Return in about 3 months (around 05/21/2021) for ISK, AK, DSAP.  IJamesetta Orleans, CMA, am acting as scribe for Brendolyn Patty, MD .   Documentation: I have reviewed the above documentation for accuracy and completeness, and I agree with the above.  Brendolyn Patty MD

## 2021-02-20 NOTE — Patient Instructions (Addendum)
Cryotherapy Aftercare  Wash gently with soap and water everyday.   Apply Vaseline and Band-Aid daily until healed.   Seborrheic Keratosis  What causes seborrheic keratoses? Seborrheic keratoses are harmless, common skin growths that first appear during adult life.  As time goes by, more growths appear.  Some people may develop a large number of them.  Seborrheic keratoses appear on both covered and uncovered body parts.  They are not caused by sunlight.  The tendency to develop seborrheic keratoses can be inherited.  They vary in color from skin-colored to gray, brown, or even black.  They can be either smooth or have a rough, warty surface.   Seborrheic keratoses are superficial and look as if they were stuck on the skin.  Under the microscope this type of keratosis looks like layers upon layers of skin.  That is why at times the top layer may seem to fall off, but the rest of the growth remains and re-grows.    Treatment Seborrheic keratoses do not need to be treated, but can easily be removed in the office.  Seborrheic keratoses often cause symptoms when they rub on clothing or jewelry.  Lesions can be in the way of shaving.  If they become inflamed, they can cause itching, soreness, or burning.  Removal of a seborrheic keratosis can be accomplished by freezing, burning, or surgery. If any spot bleeds, scabs, or grows rapidly, please return to have it checked, as these can be an indication of a skin cancer.  Chronic inherited condition of sun-exposed skin, most commonly arms and legs.  Difficult to treat.  Recommend photoprotection and regular use of spf 30 or higher sunscreen to prevent worsening of condition and precancerous changes.  Start Cholesterol 2% Lovastatin 2% Cream 240 gm- Apply twice daily as directed to affected areas arms and legs.  Sent to [facility]  Instructions for [facility] Medications  One or more of your medications was sent to the [facility] mail order  compounding pharmacy. You will receive an email from them and can purchase the medicine through that link. It will then be mailed to your home at the address you confirmed. If for any reason you do not receive an email from them, please check your spam folder. If you still do not find the email, please let us know. [facility] phone number is [phone].  If You Need Anything After Your Visit  If you have any questions or concerns for your doctor, please call our main line at [phone] and press option 4 to reach your doctor's medical assistant. If no one answers, please leave a voicemail as directed and we will return your call as soon as possible. Messages left after 4 pm will be answered the following business day.   You may also send [place] a message via [place]. We typically respond to MyChart messages within 1-2 business days.  For prescription refills, please ask your pharmacy to contact our office. Our fax number is [phone].  If you have an urgent issue when the clinic is closed that cannot wait until the next business day, you can page your doctor at the number below.    Please note that while we do our best to be available for urgent issues outside of office hours, we are not available 24/7.   If you have an urgent issue and are unable to reach [place], you may choose to seek medical care at your doctor's office, retail clinic, urgent care center, or emergency room.  If you have a medical emergency, please immediately call 911 or go to the emergency department.  Pager Numbers  - Dr. Nehemiah Massed: [phone]  - Dr. Laurence Ferrari: [phone]  - Dr. Nicole Kindred: [phone]  In the event of inclement weather, please call our main line at [phone] for an update on the status of any delays or closures.  Dermatology Medication Tips: Please keep the boxes that topical medications come in in order to help keep track of the instructions about where and how to use these. Pharmacies  typically print the medication instructions only on the boxes and not directly on the medication tubes.   If your medication is too expensive, please contact our office at [phone] option 4 or send [place] a message through [place].   We are unable to tell what your co-pay for medications will be in advance as this is different depending on your insurance coverage. However, we may be able to find a substitute medication at lower cost or fill out paperwork to get insurance to cover a needed medication.   If a prior authorization is required to get your medication covered by your insurance company, please allow [place] 1-2 business days to complete this process.  Drug prices often vary depending on where the prescription is filled and some pharmacies may offer cheaper prices.  The website www.goodrx.com contains coupons for medications through different pharmacies. The prices here do not account for what the cost may be with help from insurance (it may be cheaper with your insurance), but the website can give you the price if you did not use any insurance.  - You can print the associated coupon and take it with your prescription to the pharmacy.  - You may also stop by our office during regular business hours and pick up a GoodRx coupon card.  - If you need your prescription sent electronically to a different pharmacy, notify our office through [facility] or by phone at [phone] option 4.     Si Usted Necesita Algo Despus de Su Visita  Tambin puede enviarnos un mensaje a travs de Pharmacist, community. Por lo general respondemos a los mensajes de MyChart en el transcurso de 1 a 2 das hbiles.  Para renovar recetas, por favor pida a su farmacia que se ponga en contacto con nuestra oficina. Harland Dingwall de fax es [place] [phone].  Si tiene un asunto urgente cuando la clnica est cerrada y que no puede esperar hasta el siguiente da hbil, puede llamar/localizar a su doctor(a) al nmero que  aparece a continuacin.   Por favor, tenga en cuenta que aunque hacemos todo lo posible para estar disponibles para asuntos urgentes fuera del horario de [place], no estamos disponibles las 24 horas del da, los 7 das de la [place].   Si tiene un problema urgente y no puede comunicarse con nosotros, puede optar por buscar atencin mdica  en el consultorio de su doctor(a), en una clnica privada, en un centro de atencin urgente o en una sala de emergencias.  Si tiene Engineering geologist, por favor llame inmediatamente al 911 o vaya a la sala de emergencias.  Nmeros de bper  - Dr. Nehemiah Massed: [phone]  - Dra. Moye: [phone]  - Dra. Nicole Kindred: [phone]  En caso de inclemencias del [place], por favor llame a Johnsie Kindred principal al [phone] para una actualizacin sobre el [place] de cualquier retraso o cierre.  Consejos para la medicacin en dermatologa: Por favor, guarde las cajas en las que vienen los medicamentos de  uso tpico para ayudarle a seguir las [employer] dnde y cmo usarlos. Las farmacias generalmente imprimen las instrucciones del medicamento slo en las cajas y no directamente en los tubos del [place].   Si su medicamento es muy caro, por favor, pngase en contacto con Zigmund Daniel llamando al [phone] y presione la opcin 4 o envenos un mensaje a travs de Pharmacist, community.   No podemos decirle cul ser su copago por los medicamentos por adelantado ya que esto es diferente dependiendo de la cobertura de su seguro. Sin embargo, es posible que podamos encontrar un medicamento sustituto a Electrical engineer un formulario para que el seguro cubra el medicamento que se considera necesario.   Si se requiere una autorizacin previa para que su compaa de seguros [place] su medicamento, por favor permtanos de 1 a 2 das hbiles para completar este proceso.  Los precios de los medicamentos varan con frecuencia dependiendo del Environmental consultant de dnde se surte  la receta y alguna farmacias pueden ofrecer precios ms baratos.  El sitio web www.goodrx.com tiene cupones para medicamentos de Airline pilot. Los precios aqu no tienen en cuenta lo que podra costar con la ayuda del seguro (puede ser ms barato con su seguro), pero el sitio web puede darle el precio si no utiliz Research scientist (physical sciences).  - Puede imprimir el cupn correspondiente y llevarlo con su receta a la farmacia.  - Tambin puede pasar por nuestra oficina durante el horario de atencin regular y Charity fundraiser una tarjeta de cupones de GoodRx.  - Si necesita que su receta se enve electrnicamente a una farmacia diferente, informe a nuestra oficina a travs de MyChart de [place] o por telfono llamando al [phone] y presione la opcin 4.

## 2021-03-05 ENCOUNTER — Other Ambulatory Visit: Payer: Self-pay

## 2021-03-05 ENCOUNTER — Ambulatory Visit (INDEPENDENT_AMBULATORY_CARE_PROVIDER_SITE_OTHER): Payer: Medicare Other | Admitting: Orthopedic Surgery

## 2021-03-05 DIAGNOSIS — M65331 Trigger finger, right middle finger: Secondary | ICD-10-CM | POA: Diagnosis not present

## 2021-03-05 DIAGNOSIS — M65332 Trigger finger, left middle finger: Secondary | ICD-10-CM

## 2021-03-05 DIAGNOSIS — L821 Other seborrheic keratosis: Secondary | ICD-10-CM | POA: Insufficient documentation

## 2021-03-05 NOTE — Progress Notes (Signed)
Trigger finger injection  Diagnosis tenosynovitis left long finger Procedure injection A1 pulley Medications lidocaine 1% 1 mL and Depo-Medrol 40 mg 1 mL Skin prep alcohol and ethyl chloride Verbal consent was obtained Timeout confirmed the injection site  After cleaning the skin with alcohol and anesthetizing the skin with ethyl chloride the A1 pulley was palpated and the injection was performed without complication  Trigger finger injection  Diagnosis tenosynovitis right long finger Procedure injection A1 pulley Medications lidocaine 1% 1 mL and Depo-Medrol 40 mg 1 mL Skin prep alcohol and ethyl chloride Verbal consent was obtained Timeout confirmed the injection site  After cleaning the skin with alcohol and anesthetizing the skin with ethyl chloride the A1 pulley was palpated and the injection was performed without complication  Encounter Diagnoses  Name Primary?   Trigger middle finger of left hand Yes   Trigger finger, right middle finger

## 2021-03-07 LAB — COMPREHENSIVE METABOLIC PANEL
ALT: 42 IU/L — ABNORMAL HIGH (ref 0–32)
AST: 43 IU/L — ABNORMAL HIGH (ref 0–40)
Albumin/Globulin Ratio: 1.9 (ref 1.2–2.2)
Albumin: 4.8 g/dL (ref 3.8–4.8)
Alkaline Phosphatase: 112 IU/L (ref 44–121)
BUN/Creatinine Ratio: 17 (ref 12–28)
BUN: 11 mg/dL (ref 8–27)
Bilirubin Total: 0.3 mg/dL (ref 0.0–1.2)
CO2: 23 mmol/L (ref 20–29)
Calcium: 10.2 mg/dL (ref 8.7–10.3)
Chloride: 98 mmol/L (ref 96–106)
Creatinine, Ser: 0.66 mg/dL (ref 0.57–1.00)
Globulin, Total: 2.5 g/dL (ref 1.5–4.5)
Glucose: 81 mg/dL (ref 70–99)
Potassium: 4.5 mmol/L (ref 3.5–5.2)
Sodium: 139 mmol/L (ref 134–144)
Total Protein: 7.3 g/dL (ref 6.0–8.5)
eGFR: 96 mL/min/{1.73_m2} (ref >59)

## 2021-03-13 ENCOUNTER — Encounter: Payer: Self-pay | Admitting: Nurse Practitioner

## 2021-03-13 ENCOUNTER — Ambulatory Visit (INDEPENDENT_AMBULATORY_CARE_PROVIDER_SITE_OTHER): Payer: Medicare Other | Admitting: Nurse Practitioner

## 2021-03-13 ENCOUNTER — Other Ambulatory Visit: Payer: Self-pay

## 2021-03-13 ENCOUNTER — Other Ambulatory Visit: Payer: Self-pay | Admitting: "Endocrinology

## 2021-03-13 VITALS — BP 124/68 | HR 80 | Ht 64.75 in | Wt 222.2 lb

## 2021-03-13 DIAGNOSIS — E782 Mixed hyperlipidemia: Secondary | ICD-10-CM

## 2021-03-13 DIAGNOSIS — E559 Vitamin D deficiency, unspecified: Secondary | ICD-10-CM

## 2021-03-13 DIAGNOSIS — Z794 Long term (current) use of insulin: Secondary | ICD-10-CM | POA: Diagnosis not present

## 2021-03-13 DIAGNOSIS — I10 Essential (primary) hypertension: Secondary | ICD-10-CM | POA: Diagnosis not present

## 2021-03-13 DIAGNOSIS — E1169 Type 2 diabetes mellitus with other specified complication: Secondary | ICD-10-CM | POA: Diagnosis not present

## 2021-03-13 LAB — POCT GLYCOSYLATED HEMOGLOBIN (HGB A1C): HbA1c, POC (controlled diabetic range): 8.9 % — AB (ref 0.0–7.0)

## 2021-03-13 MED ORDER — GLIPIZIDE ER 5 MG PO TB24: 5.0000 mg | ORAL_TABLET | Freq: Every day | ORAL | 3 refills | Status: DC

## 2021-03-13 MED ORDER — OZEMPIC (0.25 OR 0.5 MG/DOSE) 2 MG/1.5ML ~~LOC~~ SOPN: 0.5000 mg | PEN_INJECTOR | SUBCUTANEOUS | 3 refills | Status: DC

## 2021-03-13 NOTE — Progress Notes (Signed)
03/13/2021   Endocrinology follow-up note    Subjective:    Patient ID: Melanie Schultz, female    DOB: [birth year],  Melanie Schultz presents today for follow up of uncontrolled type 2 diabetes, hypertension, and hyperlipidemia.  Past Medical History:  Diagnosis Date   Diabetes mellitus without complication ([place])    Multiple sclerosis ([place])    Past Surgical History:  Procedure Laterality Date   BREAST BIOPSY Left    BREAST EXCISIONAL BIOPSY Left    benign   CESAREAN SECTION     NASAL SEPTUM SURGERY     Social History   Socioeconomic History   Marital status: Divorced    Spouse name: Not on file   Number of children: Not on file   Years of education: Not on file   Highest education level: Not on file  Occupational History   Not on file  Tobacco Use   Smoking status: Former    Types: Cigarettes    Quit date: 02/04/1978    Years since quitting: 43.1   Smokeless tobacco: Never  Vaping Use   Vaping Use: Never used  Substance and Sexual Activity   Alcohol use: No    Alcohol/week: 0.0 standard drinks   Drug use: No   Sexual activity: Not on file  Other Topics Concern   Not on file  Social History Narrative   Not on file   Social Determinants of Health   Financial Resource Strain: Not on file  Food Insecurity: Not on file  Transportation Needs: Not on file  Physical Activity: Not on file  Stress: Not on file  Social Connections: Not on file   Outpatient Encounter Medications as of 03/13/2021  Medication Sig   Accu-Chek Softclix Lancets lancets 100 each by Other route 4 (four) times daily. Use as instructed   ARIPiprazole (ABILIFY) 2 MG tablet Take 2 mg by mouth daily.   atenolol (TENORMIN) 50 MG tablet Take 1 tablet (50 mg total) by mouth 2 (two) times daily.   B-D ULTRAFINE III SHORT PEN 31G X 8 MM MISC USE 1 PEN NEEDLE 4 TIMES DAILY AS DIRECTED   betamethasone valerate ointment (VALISONE) 0.1 % Apply 1 application topically 2 (two) times daily. Apply to affected  skin twice daily for 10-14 days.   Cholecalciferol (VITAMIN D3) 125 MCG (5000 UT) CAPS Take by mouth daily.   FLUoxetine (PROZAC) 40 MG capsule Take 40 mg by mouth at bedtime.   folic acid-vitamin b complex-vitamin c-selenium-zinc (DIALYVITE) 3 MG TABS tablet Take by mouth.   glipiZIDE (GLUCOTROL XL) 5 MG 24 hr tablet Take 1 tablet (5 mg total) by mouth daily with breakfast.   glucose blood (ACCU-CHEK AVIVA PLUS) test strip TEST 4 TIMES DAILY DX : E11.65   hydrochlorothiazide (HYDRODIURIL) 12.5 MG tablet Take 12.5 mg by mouth daily.   insulin lispro (HUMALOG KWIKPEN) 100 UNIT/ML KwikPen Inject 20-26 Units into the skin 3 (three) times daily with meals.   losartan (COZAAR) 50 MG tablet Take 50 mg by mouth daily.   metFORMIN (GLUCOPHAGE-XR) 500 MG 24 hr tablet TAKE 2 TABLETS BY MOUTH ONCE DAILY WITH BREAKFAST   methylphenidate (RITALIN) 10 MG tablet Take 20 mg by mouth daily. Take 2 tablets four times daily   omeprazole (PRILOSEC) 20 MG capsule Take 20 mg by mouth daily.   Probiotic Product (PROBIOTIC PO) Take by mouth daily.   rosuvastatin (CRESTOR) 20 MG tablet Take 20 mg by mouth daily.   Semaglutide,0.25 or 0.5MG /DOS, (OZEMPIC, 0.25 OR 0.5  MG/DOSE,) 2 MG/1.5ML SOPN Inject 0.5 mg into the skin once a week.   Teriflunomide 14 MG TABS Take by mouth.   TOUJEO MAX SOLOSTAR 300 UNIT/ML Solostar Pen INJECT 110 UNITS SUBCUTANEOUSLY AT BEDTIME   UNABLE TO FIND Med Name: Tumeric, Liver essentials, Evening primrose, iron, Artichoke extract, Milk thistle                      Takes these once daily.   [DISCONTINUED] glipiZIDE (GLUCOTROL XL) 10 MG 24 hr tablet Take 1 tablet (10 mg total) by mouth daily with breakfast.   NOVOLOG FLEXPEN 100 UNIT/ML FlexPen INJECT 20 TO 26 UNITS SUBCUTANEOUSLY THREE TIMES DAILY WITH MEALS (Patient not taking: Reported on 03/13/2021)   No facility-administered encounter medications on file as of 03/13/2021.   ALLERGIES: Allergies  Allergen Reactions   Lisinopril     Other  reaction(s): cough, Cough   Mango Flavor     Other reaction(s): rash Mango fruit   VACCINATION STATUS:  There is no immunization history on file for this patient.  Diabetes She presents for her follow-up diabetic visit. She has type 2 diabetes mellitus. Onset time: She was diagnosed at approximate age of 26 years. Her disease course has been fluctuating. There are no hypoglycemic associated symptoms. Pertinent negatives for hypoglycemia include no confusion, headaches, pallor or seizures. Associated symptoms include foot paresthesias. Pertinent negatives for diabetes include no chest pain, no fatigue, no polydipsia, no polyphagia, no polyuria and no weight loss. There are no hypoglycemic complications. Symptoms are stable. Diabetic complications include nephropathy and peripheral neuropathy. Risk factors for coronary artery disease include dyslipidemia, hypertension, obesity, sedentary lifestyle and diabetes mellitus. Current diabetic treatment includes oral agent (dual therapy) and intensive insulin program. She is compliant with treatment most of the time. Her weight is increasing steadily. She is following a generally unhealthy diet. When asked about meal planning, she reported none. She has had a previous visit with a dietitian. She participates in exercise intermittently. Her home blood glucose trend is fluctuating minimally. Her overall blood glucose range is 180-200 mg/dl. (She presents today with her meter and logs showing improving glycemic profile over the last several weeks.  Her POCT A1c today is 8.9%, increasing from last visit of 8.4%.  She admits she still eats things she knows she shouldn't.  She asked about Ozempic to help aide in her weight loss attempts.  Analysis of her meter shows 7-day average of 197, 14-day average of 218, 30-day average of 219, and 90-day average of 226.  She denies any hypoglycemia.) An ACE inhibitor/angiotensin II receptor blocker is being taken. She does not see  a podiatrist.Eye exam is current.  Hyperlipidemia This is a chronic problem. The current episode started more than 1 year ago. The problem is uncontrolled. Recent lipid tests were reviewed and are variable. Exacerbating diseases include chronic renal disease, diabetes and obesity. Factors aggravating her hyperlipidemia include fatty foods, beta blockers and thiazides. Pertinent negatives include no chest pain, myalgias or shortness of breath. Current antihyperlipidemic treatment includes statins. The current treatment provides mild improvement of lipids. Compliance problems include adherence to diet.  Risk factors for coronary artery disease include diabetes mellitus, dyslipidemia, a sedentary lifestyle, post-menopausal, obesity and hypertension.  Hypertension This is a chronic problem. The current episode started more than 1 year ago. The problem has been resolved since onset. The problem is controlled. Pertinent negatives include no chest pain, headaches, palpitations or shortness of breath. Agents associated with hypertension include amphetamines.  Risk factors for coronary artery disease include diabetes mellitus, dyslipidemia, post-menopausal state, sedentary lifestyle and smoking/tobacco exposure. Past treatments include angiotensin blockers, beta blockers and diuretics. The current treatment provides mild improvement. Compliance problems include diet and exercise.  Hypertensive end-organ damage includes kidney disease. Identifiable causes of hypertension include chronic renal disease.    Review of systems  Constitutional: + Minimally fluctuating body weight,  current Body mass index is 37.26 kg/m. , no fatigue, no subjective hyperthermia, no subjective hypothermia Eyes: no blurry vision, no xerophthalmia ENT: no sore throat, no nodules palpated in throat, no dysphagia/odynophagia, no hoarseness Cardiovascular: no chest pain, no shortness of breath, no palpitations, no leg swelling Respiratory: no  cough, no shortness of breath Gastrointestinal: no nausea/vomiting/diarrhea Musculoskeletal: no muscle/joint aches, + muscle weakness (history of MS) Skin: no rashes, no hyperemia, Neurological: no tremors, + numbness/tingling to BLE (esp bottom of feet), no dizziness Psychiatric: no depression, no anxiety    Objective:    BP 124/68    Pulse 80    Ht 5' 4.75" (1.645 m)    Wt 222 lb 3.2 oz (100.8 kg)    SpO2 99%    BMI 37.26 kg/m   Wt Readings from Last 3 Encounters:  03/13/21 222 lb 3.2 oz (100.8 kg)  11/09/20 217 lb 3.2 oz (98.5 kg)  10/23/20 218 lb 3.2 oz (99 kg)    BP Readings from Last 3 Encounters:  03/13/21 124/68  11/09/20 126/86  10/23/20 (!) 145/87     Physical Exam- Limited  Constitutional:  Body mass index is 37.26 kg/m. , not in acute distress, normal state of mind Eyes:  EOMI, no exophthalmos Neck: Supple Cardiovascular: RRR, no murmurs, rubs, or gallops, no edema Respiratory: Adequate breathing efforts, no crackles, rales, rhonchi, or wheezing Musculoskeletal: no gross deformities, strength intact in all four extremities, no gross restriction of joint movements Skin:  no rashes, no hyperemia Neurological: no tremor with outstretched hands    Results for orders placed or performed in visit on 03/13/21  POCT glycosylated hemoglobin (Hb A1C)  Result Value Ref Range   Hemoglobin A1C     HbA1c POC (<> result, manual entry)     HbA1c, POC (prediabetic range)     HbA1c, POC (controlled diabetic range) 8.9 (A) 0.0 - 7.0 %   Lipid Panel     Component Value Date/Time   CHOL 156 07/31/2020 0834   TRIG 193 (H) 07/31/2020 0834   HDL 33 (L) 07/31/2020 0834   CHOLHDL 4.7 (H) 07/31/2020 0834   CHOLHDL 4.2 06/17/2019 0818   VLDL 57 (H) 09/04/2016 1036   LDLCALC 90 07/31/2020 0834   LDLCALC 90 06/17/2019 0818   Recent Results (from the past 2160 hour(s))  Comprehensive metabolic panel     Status: Abnormal   Collection Time: 03/06/21  9:32 AM  Result Value Ref  Range   Glucose 81 70 - 99 mg/dL   BUN 11 8 - 27 mg/dL   Creatinine, Ser 0.66 0.57 - 1.00 mg/dL   eGFR 96 >59 mL/min/1.73   BUN/Creatinine Ratio 17 12 - 28   Sodium 139 134 - 144 mmol/L   Potassium 4.5 3.5 - 5.2 mmol/L   Chloride 98 96 - 106 mmol/L   CO2 23 20 - 29 mmol/L   Calcium 10.2 8.7 - 10.3 mg/dL   Total Protein 7.3 6.0 - 8.5 g/dL   Albumin 4.8 3.8 - 4.8 g/dL   Globulin, Total 2.5 1.5 - 4.5 g/dL   Albumin/Globulin Ratio 1.9 1.2 -  2.2   Bilirubin Total 0.3 0.0 - 1.2 mg/dL   Alkaline Phosphatase 112 44 - 121 IU/L   AST 43 (H) 0 - 40 IU/L   ALT 42 (H) 0 - 32 IU/L  POCT glycosylated hemoglobin (Hb A1C)     Status: Abnormal   Collection Time: 03/13/21  1:31 PM  Result Value Ref Range   Hemoglobin A1C     HbA1c POC (<> result, manual entry)     HbA1c, POC (prediabetic range)     HbA1c, POC (controlled diabetic range) 8.9 (A) 0.0 - 7.0 %     Assessment & Plan:   1) Uncontrolled type 2 diabetes mellitus with complication, with long-term current use of insulin ([place])  She presents today with her meter and logs showing improving glycemic profile over the last several weeks.  Her POCT A1c today is 8.9%, increasing from last visit of 8.4%.  She admits she still eats things she knows she shouldn't.  She asked about Ozempic to help aide in her weight loss attempts.  Analysis of her meter shows 7-day average of 197, 14-day average of 218, 30-day average of 219, and 90-day average of 226.  She denies any hypoglycemia.   Recent labs reviewed showing elevated liver function tests (following up with PCP about this).  - Patient remains at a high risk for more acute and chronic complications of diabetes which include CAD, CVA, CKD, retinopathy, and neuropathy. These are all discussed in detail with the patient.  - Nutritional counseling repeated at each appointment due to patients tendency to fall back in to old habits.  - The patient admits there is a room for improvement in their diet and  drink choices. -  Suggestion is made for the patient to avoid simple carbohydrates from their diet including Cakes, Sweet Desserts / Pastries, Ice Cream, Soda (diet and regular), Sweet Tea, Candies, Chips, Cookies, Sweet Pastries, Store Bought Juices, Alcohol in Excess of 1-2 drinks a day, Artificial Sweeteners, Coffee Creamer, and "Sugar-free" Products. This will help patient to have stable blood glucose profile and potentially avoid unintended weight gain.   - I encouraged the patient to switch to unprocessed or minimally processed complex starch and increased protein intake (animal or plant source), fruits, and vegetables.   - Patient is advised to stick to a routine mealtimes to eat 3 meals a day and avoid unnecessary snacks (to snack only to correct hypoglycemia).  - I have approached patient with the following individualized plan to manage diabetes and patient agrees.  -Given her improved glycemic profile, she is advised to continue her current regimen of Toujeo 110 units SQ nightly, Novolog 20-26 units TID with meals (Specific instructions on how to titrate insulin dosage based on glucose readings given to patient in writing), and Metformin 1000 mg ER daily with breakfast.   I did lower her Glipizide to 5 mg XL daily with breakfast and initiate trial of Ozempic 0.25 mg SQ weekly x 2 weeks, then advance to 0.5 mg SQ weekly thereafter to aide in her weight loss attempts and curb her sugar cravings.  -She is encouraged to continue to monitor blood glucose at least 4 times a day, before meals and at bedtime and report blood glucose readings less than 70 or greater than 200 for 3 tests in a row.  She would have benefited from CGM device but she did not like the Dexcom device.  - Patient specific target  for A1c; LDL, HDL, Triglycerides, and  Waist Circumference were  discussed in detail.  2) BP/HTN:  Her blood pressure is controlled to target.  She is advised to continue Atenolol 50 mg p.o. twice  daily, Hydrochlorothiazide 12.5 mg p.o. daily, and Losartan 50 mg p.o. daily.    3) Lipids/HPL: Her most recent lipid panel from 07/31/20 show controlled LDL at 90 and elevated triglycerides of 193.  She is advised to continue Crestor 20 mg po daily at bedtime.  Side effects and precautions discussed with her.  She has her annual exam coming up in April with her PCP.  I asked that she have any labs done forwarded to [place] for our records.  4)  Weight/Diet:  Her Body mass index is 37.26 kg/m. -clearly complicating her diabetes management.  She is a candidate for modest weight loss.  CDE consult in progress, exercise, and carbohydrates information provided.  5) Chronic Care/Health Maintenance: -Patient is on ACE and Statin medications and is encouraged to continue to follow up with Ophthalmology, Podiatrist at least yearly or according to recommendations, and advised to  stay away from smoking. I have recommended yearly flu vaccine and pneumonia vaccination at least every 5 years; moderate intensity exercise for up to 150 minutes weekly; and  sleep for at least 7 hours a day.  I advised patient to maintain close follow up with her PCP for primary care needs and regarding her skin lesion.     I spent 40 minutes in the care of the patient today including review of labs from [place], Lipids, Thyroid Function, Hematology (current and previous including abstractions from other facilities); face-to-face time discussing  her blood glucose readings/logs, discussing hypoglycemia and hyperglycemia episodes and symptoms, medications doses, her options of short and long term treatment based on the latest standards of care / guidelines;  discussion about incorporating lifestyle medicine;  and documenting the encounter.    Please refer to Patient Instructions for Blood Glucose Monitoring and Insulin/Medications Dosing Guide"  in media tab for additional information. Please  also refer to " Patient Self Inventory" in the  Media  tab for reviewed elements of pertinent patient history.  Royetta Crochet Sibert participated in the discussions, expressed understanding, and voiced agreement with the above plans.  All questions were answered to her satisfaction. she is encouraged to contact clinic should she have any questions or concerns prior to her return visit.     Follow up plan: Return in about 3 months (around 06/10/2021) for Diabetes F/U- A1c and UM in office, Bring meter and logs, No previsit labs.    Rayetta Pigg, [facility] [address] [place], [place] [address] Phone: [phone] Fax: [phone]  03/13/2021, 1:43 PM

## 2021-03-13 NOTE — Patient Instructions (Signed)

## 2021-04-02 ENCOUNTER — Telehealth: Payer: Self-pay | Admitting: Nurse Practitioner

## 2021-04-02 NOTE — Telephone Encounter (Signed)
Patient left a VM asking for you to call her back in regards to her insulin being reduced.

## 2021-04-03 NOTE — Telephone Encounter (Signed)
Called the patient and she stated that she is having blood glucose readings in the 90's and 100's and she is wanting to lower her dosage of Toujeo.  I advised for patient to call back to give [place] blood glucose readings for the past 3 days so we can send to the doctor. Patient is at [facility] and will call back.

## 2021-04-05 NOTE — Telephone Encounter (Signed)
Pt is testing 4x a day and called back with her readings. ? ?2/27 140, 128, 123, 125 ? ?2/28 159, 122, 117, 195 ? ?3/1 99, 88, 188, 206 ?

## 2021-04-06 NOTE — Telephone Encounter (Signed)
Discussed with pt, understanding voiced. 

## 2021-04-18 ENCOUNTER — Ambulatory Visit: Payer: Medicare Other | Admitting: Orthopedic Surgery

## 2021-04-18 ENCOUNTER — Other Ambulatory Visit: Payer: Self-pay

## 2021-04-18 ENCOUNTER — Encounter: Payer: Self-pay | Admitting: Orthopedic Surgery

## 2021-04-18 VITALS — BP 145/85 | HR 83 | Ht 64.0 in | Wt 218.0 lb

## 2021-04-18 DIAGNOSIS — Z01818 Encounter for other preprocedural examination: Secondary | ICD-10-CM | POA: Diagnosis not present

## 2021-04-18 DIAGNOSIS — M65332 Trigger finger, left middle finger: Secondary | ICD-10-CM

## 2021-04-19 NOTE — Progress Notes (Signed)
Melanie Schultz ? ?04/19/2021 ? ?Body mass index is 37.42 kg/m?. ? ?ASSESSMENT AND PLAN:    ? ?Encounter Diagnoses  ?Name Primary?  ? Trigger middle finger of left hand Yes  ? Pre-op exam   ? ? ? ?Patient has opted for surgical management of her left long/middle finger and will have an A1 pulley release of the left long middle finger ? ?Chief Complaint  ?Patient presents with  ? Hand Pain  ?  Trigger finger left long, to discuss surgery, tried injections  ? ?HPI ? ?Melanie Schultz is 66 years old she has been dealing with trigger finger since 2020 January she has had multiple injections.  She says that she would like to proceed with A1 pulley release for the catching locking and pain ? ?HISTORY SECTION : ? ? ?ROS currently negative ? ? has a past medical history of Diabetes mellitus without complication ([place]) and Multiple sclerosis ([place]).  ? ?Past Surgical History:  ?Procedure Laterality Date  ? BREAST BIOPSY Left   ? BREAST EXCISIONAL BIOPSY Left   ? benign  ? CESAREAN SECTION    ? NASAL SEPTUM SURGERY    ? ? ?Social History  ? ?Socioeconomic History  ? Marital status: Divorced  ?  Spouse name: Not on file  ? Number of children: Not on file  ? Years of education: Not on file  ? Highest education level: Not on file  ?Occupational History  ? Not on file  ?Tobacco Use  ? Smoking status: Former  ?  Types: Cigarettes  ?  Quit date: 02/04/1978  ?  Years since quitting: 43.2  ? Smokeless tobacco: Never  ?Vaping Use  ? Vaping Use: Never used  ?Substance and Sexual Activity  ? Alcohol use: No  ?  Alcohol/week: 0.0 standard drinks  ? Drug use: No  ? Sexual activity: Not on file  ?Other Topics Concern  ? Not on file  ?Social History Narrative  ? Not on file  ? ?Social Determinants of Health  ? ?Financial Resource Strain: Not on file  ?Food Insecurity: Not on file  ?Transportation Needs: Not on file  ?Physical Activity: Not on file  ?Stress: Not on file  ?Social Connections: Not on file  ?Intimate Partner Violence: Not on file  ? ? ? ?Family  History  ?Problem Relation Age of Onset  ? Hypertension Mother   ? Cancer Mother   ? ? ? ? ?Allergies  ?Allergen Reactions  ? Lisinopril   ?  Other reaction(s): cough, Cough  ? Mango Flavor   ?  Other reaction(s): rash ?Mango fruit  ? ? ? ?Current Outpatient Medications:  ?  Accu-Chek Softclix Lancets lancets, 100 each by Other route 4 (four) times daily. Use as instructed, Disp: 200 each, Rfl: 3 ?  ARIPiprazole (ABILIFY) 2 MG tablet, Take 2 mg by mouth daily., Disp: , Rfl:  ?  atenolol (TENORMIN) 50 MG tablet, Take 1 tablet (50 mg total) by mouth 2 (two) times daily., Disp: 180 tablet, Rfl: 0 ?  B-D ULTRAFINE III SHORT PEN 31G X 8 MM MISC, USE 1 PEN NEEDLE 4 TIMES DAILY AS DIRECTED, Disp: 200 each, Rfl: 2 ?  betamethasone valerate ointment (VALISONE) 0.1 %, Apply 1 application topically 2 (two) times daily. Apply to affected skin twice daily for 10-14 days., Disp: 60 g, Rfl: 0 ?  Cholecalciferol (VITAMIN D3) 125 MCG (5000 UT) CAPS, Take by mouth daily., Disp: , Rfl:  ?  FLUoxetine (PROZAC) 40 MG capsule, Take 40 mg  by mouth at bedtime., Disp: , Rfl:  ?  folic acid-vitamin b complex-vitamin c-selenium-zinc (DIALYVITE) 3 MG TABS tablet, Take by mouth., Disp: , Rfl:  ?  glipiZIDE (GLUCOTROL XL) 5 MG 24 hr tablet, Take 1 tablet (5 mg total) by mouth daily with breakfast., Disp: 90 tablet, Rfl: 3 ?  glucose blood (ACCU-CHEK AVIVA PLUS) test strip, TEST 4 TIMES DAILY DX : E11.65, Disp: 400 strip, Rfl: 3 ?  hydrochlorothiazide (HYDRODIURIL) 12.5 MG tablet, Take 12.5 mg by mouth daily., Disp: , Rfl:  ?  insulin lispro (HUMALOG KWIKPEN) 100 UNIT/ML KwikPen, Inject 20-26 Units into the skin 3 (three) times daily with meals., Disp: 30 mL, Rfl: 2 ?  losartan (COZAAR) 50 MG tablet, Take 50 mg by mouth daily., Disp: , Rfl:  ?  metFORMIN (GLUCOPHAGE-XR) 500 MG 24 hr tablet, TAKE 2 TABLETS BY MOUTH ONCE DAILY WITH BREAKFAST, Disp: 180 tablet, Rfl: 0 ?  methylphenidate (RITALIN) 10 MG tablet, Take 20 mg by mouth daily. Take 2  tablets four times daily, Disp: , Rfl:  ?  NOVOLOG FLEXPEN 100 UNIT/ML FlexPen, INJECT 20 TO 26 UNITS SUBCUTANEOUSLY THREE TIMES DAILY WITH MEALS, Disp: 30 mL, Rfl: 5 ?  omeprazole (PRILOSEC) 20 MG capsule, Take 20 mg by mouth daily., Disp: , Rfl:  ?  Probiotic Product (PROBIOTIC PO), Take by mouth daily., Disp: , Rfl:  ?  rosuvastatin (CRESTOR) 20 MG tablet, Take 20 mg by mouth daily., Disp: , Rfl:  ?  Semaglutide,0.25 or 0.'5MG'$ /DOS, (OZEMPIC, 0.25 OR 0.5 MG/DOSE,) 2 MG/1.5ML SOPN, Inject 0.5 mg into the skin once a week., Disp: 6 mL, Rfl: 3 ?  Teriflunomide 14 MG TABS, Take by mouth., Disp: , Rfl:  ?  TOUJEO MAX SOLOSTAR 300 UNIT/ML Solostar Pen, INJECT 110 UNITS SUBCUTANEOUSLY AT BEDTIME (Patient taking differently: 90 Units.), Disp: 30 mL, Rfl: 1 ?  UNABLE TO FIND, Med Name: Tumeric, Liver essentials, Evening primrose, iron, Artichoke extract, Milk thistle                      Takes these once daily., Disp: , Rfl:  ? ? ?PHYSICAL EXAM SECTION: ?BP (!) 145/85   Pulse 83   Ht '5\' 4"'$  (1.626 m)   Wt 218 lb (98.9 kg)   BMI 37.42 kg/m?   Body mass index is 37.42 kg/m?. ? ? ?General appearance: Well-developed well-nourished no gross deformities ? ?Eyes clear normal vision no evidence of conjunctivitis or jaundice, extraocular muscles intact ? ?ENT: ears hearing normal, nasal passages clear, throat clear  ? ?Neck is supple without palpable mass, full range of motion ? ? ?Cardiovascular normal pulse and perfusion in all 4 extremities normal color without edema ? ?Lymph nodes: No lymphadenopathy ? ?Neurologically deep tendon reflexes are equal and normal, no sensation loss or deficits no pathologic reflexes ? ? ?Skin no lacerations or ulcerations no nodularity no palpable masses, no erythema or nodularity ? ?Psychological: Awake alert and oriented x3 mood and affect normal ? ?Musculoskeletal: Left long/middle finger is tender over the A1 pulley she has snapping and catching and requires manual reduction neurovascular  exam is intact flexor tendons are normal ? ?Her only risk factor is that she is diabetic ? ?8:20 AM ? ?Arther Abbott ? ? ? ? ?

## 2021-04-24 ENCOUNTER — Other Ambulatory Visit: Payer: Self-pay

## 2021-04-24 ENCOUNTER — Ambulatory Visit: Payer: Medicare Other | Admitting: Dermatology

## 2021-04-24 DIAGNOSIS — L565 Disseminated superficial actinic porokeratosis (DSAP): Secondary | ICD-10-CM | POA: Diagnosis not present

## 2021-04-24 DIAGNOSIS — L570 Actinic keratosis: Secondary | ICD-10-CM

## 2021-04-24 DIAGNOSIS — L820 Inflamed seborrheic keratosis: Secondary | ICD-10-CM

## 2021-04-24 NOTE — Patient Instructions (Addendum)
Cryotherapy Aftercare ? ?Wash gently with soap and water everyday.   ?Apply Vaseline and Band-Aid daily until healed.  ? ? ?Seborrheic Keratosis ? ?What causes seborrheic keratoses? ?Seborrheic keratoses are harmless, common skin growths that first appear during adult life.  As time goes by, more growths appear.  Some people may develop a large number of them.  Seborrheic keratoses appear on both covered and uncovered body parts.  They are not caused by sunlight.  The tendency to develop seborrheic keratoses can be inherited.  They vary in color from skin-colored to gray, brown, or even black.  They can be either smooth or have a rough, warty surface.   ?Seborrheic keratoses are superficial and look as if they were stuck on the skin.  Under the microscope this type of keratosis looks like layers upon layers of skin.  That is why at times the top layer may seem to fall off, but the rest of the growth remains and re-grows.   ? ?Treatment ?Seborrheic keratoses do not need to be treated, but can easily be removed in the office.  Seborrheic keratoses often cause symptoms when they rub on clothing or jewelry.  Lesions can be in the way of shaving.  If they become inflamed, they can cause itching, soreness, or burning.  Removal of a seborrheic keratosis can be accomplished by freezing, burning, or surgery. ?If any spot bleeds, scabs, or grows rapidly, please return to have it checked, as these can be an indication of a skin cancer. ?If You Need Anything After Your Visit ? ?If you have any questions or concerns for your doctor, please call our main line at [phone] and press option 4 to reach your doctor's medical assistant. If no one answers, please leave a voicemail as directed and we will return your call as soon as possible. Messages left after 4 pm will be answered the following business day.  ? ?You may also send [place] a message via MyChart. We typically respond to MyChart messages within 1-2 business days. ? ?For  prescription refills, please ask your pharmacy to contact our office. Our fax number is [phone]. ? ?If you have an urgent issue when the clinic is closed that cannot wait until the next business day, you can page your doctor at the number below.   ? ?Please note that while we do our best to be available for urgent issues outside of office hours, we are not available 24/7.  ? ?If you have an urgent issue and are unable to reach [place], you may choose to seek medical care at your doctor's office, retail clinic, urgent care center, or emergency room. ? ?If you have a medical emergency, please immediately call 911 or go to the emergency department. ? ?Pager Numbers ? ?- Dr. Nehemiah Massed: [phone] ? ?- Dr. Laurence Ferrari: [phone] ? ?- Dr. Nicole Kindred: [phone] ? ?In the event of inclement weather, please call our main line at [phone] for an update on the status of any delays or closures. ? ?Dermatology Medication Tips: ?Please keep the boxes that topical medications come in in order to help keep track of the instructions about where and how to use these. Pharmacies typically print the medication instructions only on the boxes and not directly on the medication tubes.  ? ?If your medication is too expensive, please contact our office at [phone] option 4 or send [place] a message through [place].  ? ?We are unable to tell what your co-pay for medications will be in advance as this  is different depending on your insurance coverage. However, we may be able to find a substitute medication at lower cost or fill out paperwork to get insurance to cover a needed medication.  ? ?If a prior authorization is required to get your medication covered by your insurance company, please allow [place] 1-2 business days to complete this process. ? ?Drug prices often vary depending on where the prescription is filled and some pharmacies may offer cheaper prices. ? ?The website www.goodrx.com contains coupons for medications through different  pharmacies. The prices here do not account for what the cost may be with help from insurance (it may be cheaper with your insurance), but the website can give you the price if you did not use any insurance.  ?- You can print the associated coupon and take it with your prescription to the pharmacy.  ?- You may also stop by our office during regular business hours and pick up a GoodRx coupon card.  ?- If you need your prescription sent electronically to a different pharmacy, notify our office through [facility] or by phone at [phone] option 4. ? ? ? ? ?Si Usted Necesita Algo Despu?s de Su Visita ? ?Tambi?n puede enviarnos un mensaje a trav?s de MyChart. Por lo general respondemos a los mensajes de MyChart en el transcurso de 1 a 2 d?as h?biles. ? ?Para renovar recetas, por favor pida a su farmacia que se ponga en contacto con nuestra oficina. Nuestro n?mero de fax es el [phone]. ? ?Si tiene un asunto urgente cuando la cl?nica est? cerrada y que no puede esperar hasta el siguiente d?a h?bil, puede llamar/localizar a su doctor(a) al n?mero que aparece a continuaci?n.  ? ?Por favor, tenga en cuenta que aunque hacemos todo lo posible para estar disponibles para asuntos urgentes fuera del horario de oficina, no estamos disponibles las 24 horas del d?a, los 7 d?as de la semana.  ? ?Si tiene un problema urgente y no puede comunicarse con nosotros, puede optar por buscar atenci?n m?dica  en el consultorio de su doctor(a), en una cl?nica privada, en un centro de atenci?n urgente o en una sala de emergencias. ? ?Si tiene Engineer, maintenance (IT) m?dica, por favor llame inmediatamente al 911 o vaya a la sala de emergencias. ? ?N?meros de b?per ? ?- Dr. Nehemiah Massed: [phone] ? ?- Dra. Moye: [phone] ? ?- Dra. Nicole Kindred: [phone] ? ?En caso de inclemencias del tiempo, por favor llame a nuestra l?nea principal al [phone] para una actualizaci?n sobre el estado de cualquier retraso o cierre. ? ?Consejos para la  medicaci?n en dermatolog?a: ?Por favor, guarde las cajas en las que vienen los medicamentos de uso t?pico para ayudarle a seguir las instrucciones sobre d?nde y c?mo usarlos. Las farmacias generalmente imprimen las instrucciones del medicamento s?lo en las cajas y no directamente en los tubos del [place].  ? ?Si su medicamento es muy caro, por favor, p?ngase en contacto con Zigmund Daniel llamando al [phone] y presione la opci?n 4 o env?enos un mensaje a trav?s de MyChart.  ? ?No podemos decirle cu?l ser? su copago por los medicamentos por adelantado ya que esto es diferente dependiendo de la cobertura de su seguro. Sin embargo, es posible que podamos encontrar un medicamento sustituto a Electrical engineer un formulario para que el seguro cubra el medicamento que se considera necesario.  ? ?Si se requiere Ardelia Mems autorizaci?n previa para que su compa??a de seguros [place] su medicamento, por favor perm?tanos de 1 a 2 d?as h?biles para  completar [employer]. ? ?Los precios de los medicamentos var?an con frecuencia dependiendo del Environmental consultant de d?nde se surte la receta y alguna farmacias pueden ofrecer precios m?s baratos. ? ?El sitio web www.goodrx.com tiene cupones para medicamentos de Airline pilot. Los precios aqu? no tienen en cuenta lo que podr?a costar con la ayuda del seguro (puede ser m?s barato con su seguro), pero el sitio web puede darle el precio si no utiliz? ning?n seguro.  ?- Puede imprimir el cup?n correspondiente y llevarlo con su receta a la farmacia.  ?- Tambi?n puede pasar por nuestra oficina durante el horario de atenci?n regular y recoger una tarjeta de cupones de GoodRx.  ?- Si necesita que su receta se env?e electr?nicamente a Chiropodist, informe a nuestra oficina a trav?s de MyChart de [place] o por tel?fono llamando al [phone] y presione la opci?n 4. ? ?

## 2021-04-24 NOTE — Progress Notes (Signed)
? ?Follow-Up Visit ?  ?Subjective  ?Melanie Schultz is a 66 y.o. female who presents for the following: Follow-up (Patient here today for 2 month ISK, AK and DSAP follow up. Patient advises she only used [employer] Cholesterol 2% Lovastatin 2% Cream a few times and thinks it was helping with DSAP but she does not like putting creams on. She said there is still a spot at right temple area and some other ones that are irritated at neck, under breasts and back of right leg. ). ? ? ?The following portions of the chart were reviewed this encounter and updated as appropriate:  ?  ?  ? ?Review of Systems:  No other skin or systemic complaints except as noted in HPI or Assessment and Plan. ? ?Objective  ?Well appearing patient in no apparent distress; mood and affect are within normal limits. ? ?A focused examination was performed including chest, legs, arms, face. Relevant physical exam findings are noted in the Assessment and Plan. ? ?arms, legs ?Pink scaly macules with keratotic rims at arms and legs ? ?right temple x 1, left medial shoulder x 1, mid sternum x 2, intermammary chest x 1, right  upper chest x 1, left inferior breast x 2, right popliteal x 1 (9) ?Erythematous stuck-on, waxy papule ? ?right eyebrow x 1 ?Erythematous thin papules/macules with gritty scale.  ? ? ? ?Assessment & Plan  ?Disseminated superficial actinic porokeratosis (DSAP) ?arms, legs ? ?No change, but pt has not been using SM cream regularly ? ?DSAP is a chronic inherited condition of sun-exposed skin, most commonly affecting the arms and legs.  It is difficult to treat.  Recommend photoprotection and regular use of spf 30 or higher sunscreen to prevent worsening of condition and precancerous changes. ? ?Continue Cholesterol 2% Lovastatin 2% Cream 240 gm- Apply twice daily as directed to affected areas arms and legs.  ? ? ?Inflamed seborrheic keratosis (9) ?right temple x 1, left medial shoulder x 1, mid sternum x 2, intermammary chest x 1,  right  upper chest x 1, left inferior breast x 2, right popliteal x 1 ? ?Residual at right temple ? ?Destruction of lesion - right temple x 1, left medial shoulder x 1, mid sternum x 2, intermammary chest x 1, right  upper chest x 1, left inferior breast x 2, right popliteal x 1 ? ?Destruction method: cryotherapy   ?Informed consent: discussed and consent obtained   ?Lesion destroyed using liquid nitrogen: Yes   ?Region frozen until ice ball extended beyond lesion: Yes   ?Outcome: patient tolerated procedure well with no complications   ?Post-procedure details: wound care instructions given   ?Additional details:  Prior to procedure, discussed risks of blister formation, small wound, skin dyspigmentation, or rare scar following cryotherapy. Recommend Vaseline ointment to treated areas while healing.  ? ?AK (actinic keratosis) ?right eyebrow x 1 ? ?Residual at right eyebrow ? ?Destruction of lesion - right eyebrow x 1 ? ?Destruction method: cryotherapy   ?Informed consent: discussed and consent obtained   ?Lesion destroyed using liquid nitrogen: Yes   ?Region frozen until ice ball extended beyond lesion: Yes   ?Outcome: patient tolerated procedure well with no complications   ?Post-procedure details: wound care instructions given   ?Additional details:  Prior to procedure, discussed risks of blister formation, small wound, skin dyspigmentation, or rare scar following cryotherapy. Recommend Vaseline ointment to treated areas while healing.  ? ? ?Return in about 10 months (around 02/24/2022) for TBSE. ? ?Graciella Belton,  RMA, am acting as scribe for Brendolyn Patty, MD . ? ?Documentation: I have reviewed the above documentation for accuracy and completeness, and I agree with the above. ? ?Brendolyn Patty MD  ?

## 2021-05-01 NOTE — Patient Instructions (Signed)
? Your procedure is scheduled on: 05/04/2021 ? Report to [place] Entrance at  10:00   AM. ? Call this number if you have problems the morning of surgery: [phone] ? ? Remember: ? ? Do not Eat or Drink after midnight  ? ?      No Smoking the morning of surgery ? : ? Take these medicines the morning of surgery with A SIP OF WATER: Abilify, atenolol, Prozac, and omeprazole ? ?            No diabetic medication am of procedure ? ?            Take only 1/2 dose of Toujeo 55 units night before your procedure ? ? ? ? ? ? ? Do not wear jewelry, make-up or nail polish. ? Do not wear lotions, powders, or perfumes. You may wear deodorant. ? Do not shave 48 hours prior to surgery. Men may shave face and neck. ? Do not bring valuables to the hospital. ? Contacts, dentures or bridgework may not be worn into surgery. ? Leave suitcase in the car. After surgery it may be brought to your room. ? For patients admitted to the hospital, checkout time is 11:00 AM the day of discharge. ? ? Patients discharged the day of surgery will not be allowed to drive home. ?  ? Special Instructions: Shower using CHG night before surgery and shower the day of surgery use CHG.  Use special wash - you have one bottle of CHG for all showers.  You should use approximately 1/2 of the bottle for each shower.  ?How to Use Chlorhexidine for Bathing ?Chlorhexidine gluconate (CHG) is a germ-killing (antiseptic) solution that is used to clean the skin. It can get rid of the bacteria that normally live on the skin and can keep them away for about 24 hours. To clean your skin with CHG, you may be given: ?A CHG solution to use in the shower or as part of a sponge bath. ?A prepackaged cloth that contains CHG. ?Cleaning your skin with CHG may help lower the risk for infection: ?While you are staying in the intensive care unit of the hospital. ?If you have a vascular access, such as a central line, to provide short-term or long-term access to your  veins. ?If you have a catheter to drain urine from your bladder. ?If you are on a ventilator. A ventilator is a machine that helps you breathe by moving air in and out of your lungs. ?After surgery. ?What are the risks? ?Risks of using CHG include: ?A skin reaction. ?Hearing loss, if CHG gets in your ears and you have a perforated eardrum. ?Eye injury, if CHG gets in your eyes and is not rinsed out. ?The CHG product catching fire. ?Make sure that you avoid smoking and flames after applying CHG to your skin. ?Do not use CHG: ?If you have a chlorhexidine allergy or have previously reacted to chlorhexidine. ?On babies younger than 102 months of age. ?How to use CHG solution ?Use CHG only as told by your health care provider, and follow the instructions on the label. ?Use the full amount of CHG as directed. Usually, this is one bottle. ?During a shower ?Follow these steps when using CHG solution during a shower (unless your health care provider gives you different instructions): ?Start the shower. ?Use your normal soap and shampoo to wash your face and hair. ?Turn off the shower or move out of the shower stream. ?Pour the CHG  onto a clean washcloth. Do not use any type of brush or rough-edged sponge. ?Starting at your neck, lather your body down to your toes. Make sure you follow these instructions: ?If you will be having surgery, pay special attention to the part of your body where you will be having surgery. Scrub this area for at least 1 minute. ?Do not use CHG on your head or face. If the solution gets into your ears or eyes, rinse them well with water. ?Avoid your genital area. ?Avoid any areas of skin that have broken skin, cuts, or scrapes. ?Scrub your back and under your arms. Make sure to wash skin folds. ?Let the lather sit on your skin for 1-2 minutes or as long as told by your health care provider. ?Thoroughly rinse your entire body in the shower. Make sure that all body creases and crevices are rinsed  well. ?Dry off with a clean towel. Do not put any substances on your body afterward--such as powder, lotion, or perfume--unless you are told to do so by your health care provider. Only use lotions that are recommended by the manufacturer. ?Put on clean clothes or pajamas. ?If it is the night before your surgery, sleep in clean sheets. ? ?During a sponge bath ?Follow these steps when using CHG solution during a sponge bath (unless your health care provider gives you different instructions): ?Use your normal soap and shampoo to wash your face and hair. ?Pour the CHG onto a clean washcloth. ?Starting at your neck, lather your body down to your toes. Make sure you follow these instructions: ?If you will be having surgery, pay special attention to the part of your body where you will be having surgery. Scrub this area for at least 1 minute. ?Do not use CHG on your head or face. If the solution gets into your ears or eyes, rinse them well with water. ?Avoid your genital area. ?Avoid any areas of skin that have broken skin, cuts, or scrapes. ?Scrub your back and under your arms. Make sure to wash skin folds. ?Let the lather sit on your skin for 1-2 minutes or as long as told by your health care provider. ?Using a different clean, wet washcloth, thoroughly rinse your entire body. Make sure that all body creases and crevices are rinsed well. ?Dry off with a clean towel. Do not put any substances on your body afterward--such as powder, lotion, or perfume--unless you are told to do so by your health care provider. Only use lotions that are recommended by the manufacturer. ?Put on clean clothes or pajamas. ?If it is the night before your surgery, sleep in clean sheets. ?How to use CHG prepackaged cloths ?Only use CHG cloths as told by your health care provider, and follow the instructions on the label. ?Use the CHG cloth on clean, dry skin. ?Do not use the CHG cloth on your head or face unless your health care provider tells  you to. ?When washing with the CHG cloth: ?Avoid your genital area. ?Avoid any areas of skin that have broken skin, cuts, or scrapes. ?Before surgery ?Follow these steps when using a CHG cloth to clean before surgery (unless your health care provider gives you different instructions): ?Using the CHG cloth, vigorously scrub the part of your body where you will be having surgery. Scrub using a back-and-forth motion for 3 minutes. The area on your body should be completely wet with CHG when you are done scrubbing. ?Do not rinse. Discard the cloth and let the area  air-dry. Do not put any substances on the area afterward, such as powder, lotion, or perfume. ?Put on clean clothes or pajamas. ?If it is the night before your surgery, sleep in clean sheets. ? ?For general bathing ?Follow these steps when using CHG cloths for general bathing (unless your health care provider gives you different instructions). ?Use a separate CHG cloth for each area of your body. Make sure you wash between any folds of skin and between your fingers and toes. Wash your body in the following order, switching to a new cloth after each step: ?The front of your neck, shoulders, and chest. ?Both of your arms, under your arms, and your hands. ?Your stomach and groin area, avoiding the genitals. ?Your right leg and foot. ?Your left leg and foot. ?The back of your neck, your back, and your buttocks. ?Do not rinse. Discard the cloth and let the area air-dry. Do not put any substances on your body afterward--such as powder, lotion, or perfume--unless you are told to do so by your health care provider. Only use lotions that are recommended by the manufacturer. ?Put on clean clothes or pajamas. ?Contact a health care provider if: ?Your skin gets irritated after scrubbing. ?You have questions about using your solution or cloth. ?You swallow any chlorhexidine. Call your local poison control center (1-(432) 572-6197 in the [place] ?Get help right away if: ?Your  eyes itch badly, or they become very red or swollen. ?Your skin itches badly and is red or swollen. ?Your hearing changes. ?You have trouble seeing. ?You have swelling or tingling in your mouth or throat. ?You have t

## 2021-05-02 ENCOUNTER — Encounter (HOSPITAL_COMMUNITY): Payer: Self-pay

## 2021-05-02 ENCOUNTER — Encounter (HOSPITAL_COMMUNITY)
Admission: RE | Admit: 2021-05-02 | Discharge: 2021-05-02 | Disposition: A | Discharge status: Home / Self Care | Payer: Medicare Other | Source: Ambulatory Visit | Attending: Orthopedic Surgery | Admitting: Orthopedic Surgery

## 2021-05-02 VITALS — BP 140/82 | HR 113 | Temp 98.7°F | Resp 18 | Ht 64.0 in | Wt 217.0 lb

## 2021-05-02 DIAGNOSIS — M65332 Trigger finger, left middle finger: Secondary | ICD-10-CM | POA: Insufficient documentation

## 2021-05-02 DIAGNOSIS — E119 Type 2 diabetes mellitus without complications: Secondary | ICD-10-CM | POA: Insufficient documentation

## 2021-05-02 DIAGNOSIS — Z01818 Encounter for other preprocedural examination: Secondary | ICD-10-CM | POA: Diagnosis present

## 2021-05-02 HISTORY — DX: Essential (primary) hypertension: I10

## 2021-05-02 HISTORY — DX: Other specified postprocedural states: Z98.890

## 2021-05-02 HISTORY — DX: Depression, unspecified: F32.A

## 2021-05-02 HISTORY — DX: Anxiety disorder, unspecified: F41.9

## 2021-05-02 HISTORY — DX: Hepatic fibrosis, unspecified: K74.00

## 2021-05-02 HISTORY — DX: Sleep apnea, unspecified: G47.30

## 2021-05-02 HISTORY — DX: Gastro-esophageal reflux disease without esophagitis: K21.9

## 2021-05-02 LAB — BASIC METABOLIC PANEL
Anion gap: 12 (ref 5–15)
BUN: 11 mg/dL (ref 8–23)
CO2: 25 mmol/L (ref 22–32)
Calcium: 9.5 mg/dL (ref 8.9–10.3)
Chloride: 98 mmol/L (ref 98–111)
Creatinine, Ser: 0.78 mg/dL (ref 0.44–1.00)
GFR, Estimated: >60 mL/min (ref >60)
Glucose, Bld: 123 mg/dL — ABNORMAL HIGH (ref 70–99)
Potassium: 4.3 mmol/L (ref 3.5–5.1)
Sodium: 135 mmol/L (ref 135–145)

## 2021-05-02 LAB — CBC
HCT: 40.2 % (ref 36.0–46.0)
Hemoglobin: 12.8 g/dL (ref 12.0–15.0)
MCH: 29.2 pg (ref 26.0–34.0)
MCHC: 31.8 g/dL (ref 30.0–36.0)
MCV: 91.6 fL (ref 80.0–100.0)
Platelets: 246 10*3/uL (ref 150–400)
RBC: 4.39 MIL/uL (ref 3.87–5.11)
RDW: 12.8 % (ref 11.5–15.5)
WBC: 10.5 10*3/uL (ref 4.0–10.5)
nRBC: 0 % (ref 0.0–0.2)

## 2021-05-02 LAB — HEMOGLOBIN A1C
Hgb A1c MFr Bld: 8 % — ABNORMAL HIGH (ref 4.8–5.6)
Mean Plasma Glucose: 182.9 mg/dL

## 2021-05-03 ENCOUNTER — Telehealth: Payer: Self-pay

## 2021-05-03 NOTE — H&P (Signed)
?Melanie Schultz ?  ?04/19/2021 ?  ?Body mass index is 37.42 kg/m?. ?  ?ASSESSMENT AND PLAN:    ?  ?    ?Encounter Diagnoses  ?Name Primary?  ? Trigger middle finger of left hand Yes  ? Pre-op exam    ?  ?  ?  ?Patient has opted for surgical management of her left long/middle finger and will have an A1 pulley release of the left long middle finger ?  ?    ?Chief Complaint  ?Patient presents with  ? Hand Pain  ?    Trigger finger left long, to discuss surgery, tried injections  ?  ?HPI ?  ?Melanie Schultz is 66 years old she has been dealing with trigger finger since 2020 January she has had multiple injections.  She says that she would like to proceed with A1 pulley release for the catching locking and pain ?  ?HISTORY SECTION : ?  ?  ?ROS currently negative ?  ? has a past medical history of Diabetes mellitus without complication ([place]) and Multiple sclerosis ([place]).  ?  ?     ?Past Surgical History:  ?Procedure Laterality Date  ? BREAST BIOPSY Left    ? BREAST EXCISIONAL BIOPSY Left    ?  benign  ? CESAREAN SECTION      ? NASAL SEPTUM SURGERY      ?  ?  ?Social History  ?  ?     ?Socioeconomic History  ? Marital status: Divorced  ?    Spouse name: Not on file  ? Number of children: Not on file  ? Years of education: Not on file  ? Highest education level: Not on file  ?Occupational History  ? Not on file  ?Tobacco Use  ? Smoking status: Former  ?    Types: Cigarettes  ?    Quit date: 02/04/1978  ?    Years since quitting: 43.2  ? Smokeless tobacco: Never  ?Vaping Use  ? Vaping Use: Never used  ?Substance and Sexual Activity  ? Alcohol use: No  ?    Alcohol/week: 0.0 standard drinks  ? Drug use: No  ? Sexual activity: Not on file  ?Other Topics Concern  ? Not on file  ?Social History Narrative  ? Not on file  ?  ?Social Determinants of Health  ?  ?Financial Resource Strain: Not on file  ?Food Insecurity: Not on file  ?Transportation Needs: Not on file  ?Physical Activity: Not on file  ?Stress: Not on file  ?Social Connections: Not  on file  ?Intimate Partner Violence: Not on file  ?  ?  ?  ?     ?Family History  ?Problem Relation Age of Onset  ? Hypertension Mother    ? Cancer Mother    ?  ?  ?  ?  ?     ?Allergies  ?Allergen Reactions  ? Lisinopril    ?    Other reaction(s): cough, Cough  ? Mango Flavor    ?    Other reaction(s): rash ?Mango fruit  ?  ?  ?  ?Current Outpatient Medications:  ?  Accu-Chek Softclix Lancets lancets, 100 each by Other route 4 (four) times daily. Use as instructed, Disp: 200 each, Rfl: 3 ?  ARIPiprazole (ABILIFY) 2 MG tablet, Take 2 mg by mouth daily., Disp: , Rfl:  ?  atenolol (TENORMIN) 50 MG tablet, Take 1 tablet (50 mg total) by mouth 2 (two) times daily., Disp: 180 tablet,  Rfl: 0 ?  B-D ULTRAFINE III SHORT PEN 31G X 8 MM MISC, USE 1 PEN NEEDLE 4 TIMES DAILY AS DIRECTED, Disp: 200 each, Rfl: 2 ?  betamethasone valerate ointment (VALISONE) 0.1 %, Apply 1 application topically 2 (two) times daily. Apply to affected skin twice daily for 10-14 days., Disp: 60 g, Rfl: 0 ?  Cholecalciferol (VITAMIN D3) 125 MCG (5000 UT) CAPS, Take by mouth daily., Disp: , Rfl:  ?  FLUoxetine (PROZAC) 40 MG capsule, Take 40 mg by mouth at bedtime., Disp: , Rfl:  ?  folic acid-vitamin b complex-vitamin c-selenium-zinc (DIALYVITE) 3 MG TABS tablet, Take by mouth., Disp: , Rfl:  ?  glipiZIDE (GLUCOTROL XL) 5 MG 24 hr tablet, Take 1 tablet (5 mg total) by mouth daily with breakfast., Disp: 90 tablet, Rfl: 3 ?  glucose blood (ACCU-CHEK AVIVA PLUS) test strip, TEST 4 TIMES DAILY DX : E11.65, Disp: 400 strip, Rfl: 3 ?  hydrochlorothiazide (HYDRODIURIL) 12.5 MG tablet, Take 12.5 mg by mouth daily., Disp: , Rfl:  ?  insulin lispro (HUMALOG KWIKPEN) 100 UNIT/ML KwikPen, Inject 20-26 Units into the skin 3 (three) times daily with meals., Disp: 30 mL, Rfl: 2 ?  losartan (COZAAR) 50 MG tablet, Take 50 mg by mouth daily., Disp: , Rfl:  ?  metFORMIN (GLUCOPHAGE-XR) 500 MG 24 hr tablet, TAKE 2 TABLETS BY MOUTH ONCE DAILY WITH BREAKFAST, Disp: 180  tablet, Rfl: 0 ?  methylphenidate (RITALIN) 10 MG tablet, Take 20 mg by mouth daily. Take 2 tablets four times daily, Disp: , Rfl:  ?  NOVOLOG FLEXPEN 100 UNIT/ML FlexPen, INJECT 20 TO 26 UNITS SUBCUTANEOUSLY THREE TIMES DAILY WITH MEALS, Disp: 30 mL, Rfl: 5 ?  omeprazole (PRILOSEC) 20 MG capsule, Take 20 mg by mouth daily., Disp: , Rfl:  ?  Probiotic Product (PROBIOTIC PO), Take by mouth daily., Disp: , Rfl:  ?  rosuvastatin (CRESTOR) 20 MG tablet, Take 20 mg by mouth daily., Disp: , Rfl:  ?  Semaglutide,0.25 or 0.'5MG'$ /DOS, (OZEMPIC, 0.25 OR 0.5 MG/DOSE,) 2 MG/1.5ML SOPN, Inject 0.5 mg into the skin once a week., Disp: 6 mL, Rfl: 3 ?  Teriflunomide 14 MG TABS, Take by mouth., Disp: , Rfl:  ?  TOUJEO MAX SOLOSTAR 300 UNIT/ML Solostar Pen, INJECT 110 UNITS SUBCUTANEOUSLY AT BEDTIME (Patient taking differently: 90 Units.), Disp: 30 mL, Rfl: 1 ?  UNABLE TO FIND, Med Name: Tumeric, Liver essentials, Evening primrose, iron, Artichoke extract, Milk thistle                      Takes these once daily., Disp: , Rfl:  ?  ?  ?PHYSICAL EXAM SECTION: ?BP (!) 145/85   Pulse 83   Ht '5\' 4"'$  (1.626 m)   Wt 218 lb (98.9 kg)   BMI 37.42 kg/m?   Body mass index is 37.42 kg/m?. ?  ?  ?General appearance: Well-developed well-nourished no gross deformities ?  ?Eyes clear normal vision no evidence of conjunctivitis or jaundice, extraocular muscles intact ?  ?ENT: ears hearing normal, nasal passages clear, throat clear  ?  ?Neck is supple without palpable mass, full range of motion ?  ? ?Cardiovascular normal pulse and perfusion in all 4 extremities normal color without edema ?  ?Lymph nodes: No lymphadenopathy ? ?Neurologically deep tendon reflexes are equal and normal, no sensation loss or deficits no pathologic reflexes ?  ?  ?Skin no lacerations or ulcerations no nodularity no palpable masses, no erythema or nodularity ?  ?Psychological:  Awake alert and oriented x3 mood and affect normal ?  ?Musculoskeletal: Left long/middle finger  is tender over the A1 pulley she has snapping and catching and requires manual reduction neurovascular exam is intact flexor tendons are normal ?  ?Her only risk factor is that she is diabetic ?  ?8:20 AM ?  ?Arther Abbott ?  ?

## 2021-05-03 NOTE — Telephone Encounter (Signed)
Kapi from [place] left message stating that this patient is scheduled for surgery tomorrow at [facility].  ?Stated that [place] told her that the authorization # [id number] does not have the facility on it, so  ?they will need a new authorization per [place] in order for the surgery to be covered. ? ?Any questions please call Kapi at [phone] ext# [phone] ?

## 2021-05-03 NOTE — Telephone Encounter (Signed)
New auth done, updated referral and messaged Kapi. ?

## 2021-05-04 ENCOUNTER — Encounter (HOSPITAL_COMMUNITY)
Admission: RE | Disposition: A | Discharge status: Home / Self Care | Payer: Self-pay | Source: Ambulatory Visit | Attending: Orthopedic Surgery

## 2021-05-04 ENCOUNTER — Encounter (HOSPITAL_COMMUNITY): Payer: Self-pay | Admitting: Orthopedic Surgery

## 2021-05-04 ENCOUNTER — Ambulatory Visit (HOSPITAL_COMMUNITY)
Admission: RE | Admit: 2021-05-04 | Discharge: 2021-05-04 | Disposition: A | Discharge status: Home / Self Care | Payer: Medicare Other | Source: Ambulatory Visit | Attending: Orthopedic Surgery | Admitting: Orthopedic Surgery

## 2021-05-04 ENCOUNTER — Ambulatory Visit (HOSPITAL_COMMUNITY): Payer: Medicare Other | Admitting: Certified Registered Nurse Anesthetist

## 2021-05-04 ENCOUNTER — Ambulatory Visit (HOSPITAL_BASED_OUTPATIENT_CLINIC_OR_DEPARTMENT_OTHER): Payer: Medicare Other | Admitting: Certified Registered Nurse Anesthetist

## 2021-05-04 DIAGNOSIS — I10 Essential (primary) hypertension: Secondary | ICD-10-CM

## 2021-05-04 DIAGNOSIS — Z87891 Personal history of nicotine dependence: Secondary | ICD-10-CM | POA: Insufficient documentation

## 2021-05-04 DIAGNOSIS — Z6837 Body mass index (BMI) 37.0-37.9, adult: Secondary | ICD-10-CM | POA: Diagnosis not present

## 2021-05-04 DIAGNOSIS — M65332 Trigger finger, left middle finger: Secondary | ICD-10-CM

## 2021-05-04 DIAGNOSIS — E119 Type 2 diabetes mellitus without complications: Secondary | ICD-10-CM | POA: Diagnosis not present

## 2021-05-04 DIAGNOSIS — G4730 Sleep apnea, unspecified: Secondary | ICD-10-CM | POA: Diagnosis not present

## 2021-05-04 DIAGNOSIS — F418 Other specified anxiety disorders: Secondary | ICD-10-CM

## 2021-05-04 DIAGNOSIS — K219 Gastro-esophageal reflux disease without esophagitis: Secondary | ICD-10-CM | POA: Insufficient documentation

## 2021-05-04 HISTORY — PX: TRIGGER FINGER RELEASE: SHX641

## 2021-05-04 LAB — GLUCOSE, CAPILLARY
Glucose-Capillary: 116 mg/dL — ABNORMAL HIGH (ref 70–99)
Glucose-Capillary: 126 mg/dL — ABNORMAL HIGH (ref 70–99)

## 2021-05-04 SURGERY — RELEASE, A1 PULLEY, FOR TRIGGER FINGER
Anesthesia: General | Site: Finger | Laterality: Left

## 2021-05-04 MED ORDER — ORAL CARE MOUTH RINSE: 15.0000 mL | Freq: Once | OROMUCOSAL | Status: AC

## 2021-05-04 MED ORDER — CHLORHEXIDINE GLUCONATE 0.12 % MT SOLN
15.0000 mL | Freq: Once | OROMUCOSAL | Status: AC
  Administered 2021-05-04: 15 mL via OROMUCOSAL

## 2021-05-04 MED ORDER — CEFAZOLIN SODIUM-DEXTROSE 2-4 GM/100ML-% IV SOLN
INTRAVENOUS | Status: AC
  Filled 2021-05-04: qty 100

## 2021-05-04 MED ORDER — PROPOFOL 500 MG/50ML IV EMUL
INTRAVENOUS | Status: DC | PRN
  Administered 2021-05-04: 25 ug/kg/min via INTRAVENOUS

## 2021-05-04 MED ORDER — CEFAZOLIN SODIUM-DEXTROSE 2-4 GM/100ML-% IV SOLN
2.0000 g | INTRAVENOUS | Status: AC
  Administered 2021-05-04: 2 g via INTRAVENOUS

## 2021-05-04 MED ORDER — MIDAZOLAM HCL 2 MG/2ML IJ SOLN
INTRAMUSCULAR | Status: DC | PRN
  Administered 2021-05-04: 2 mg via INTRAVENOUS

## 2021-05-04 MED ORDER — PROPOFOL 10 MG/ML IV BOLUS
INTRAVENOUS | Status: AC
  Filled 2021-05-04: qty 20

## 2021-05-04 MED ORDER — BUPIVACAINE HCL (PF) 0.5 % IJ SOLN
INTRAMUSCULAR | Status: DC | PRN
Start: 2021-05-04 — End: 2021-05-04
  Administered 2021-05-04: 9 mL

## 2021-05-04 MED ORDER — BUPIVACAINE HCL (PF) 0.5 % IJ SOLN
INTRAMUSCULAR | Status: AC
  Filled 2021-05-04: qty 30

## 2021-05-04 MED ORDER — LACTATED RINGERS IV SOLN
INTRAVENOUS | Status: DC
  Administered 2021-05-04: 1000 mL via INTRAVENOUS

## 2021-05-04 MED ORDER — FENTANYL CITRATE (PF) 100 MCG/2ML IJ SOLN
INTRAMUSCULAR | Status: AC
  Filled 2021-05-04: qty 2

## 2021-05-04 MED ORDER — MIDAZOLAM HCL 2 MG/2ML IJ SOLN
INTRAMUSCULAR | Status: AC
  Filled 2021-05-04: qty 2

## 2021-05-04 MED ORDER — FENTANYL CITRATE (PF) 100 MCG/2ML IJ SOLN
INTRAMUSCULAR | Status: DC | PRN
  Administered 2021-05-04 (×3): 25 ug via INTRAVENOUS

## 2021-05-04 MED ORDER — SODIUM CHLORIDE 0.9 % IR SOLN
Status: DC | PRN
  Administered 2021-05-04: 1000 mL

## 2021-05-04 MED ORDER — ACETAMINOPHEN 500 MG PO TABS: 500.0000 mg | ORAL_TABLET | Freq: Four times a day (QID) | ORAL | 2 refills | Status: AC | PRN

## 2021-05-04 MED ORDER — PROPOFOL 10 MG/ML IV BOLUS
INTRAVENOUS | Status: DC | PRN
  Administered 2021-05-04 (×2): 20 mg via INTRAVENOUS

## 2021-05-04 SURGICAL SUPPLY — 36 items
APPLICATOR CHLORAPREP 10.5 ORG (MISCELLANEOUS) ×1 IMPLANT
BANDAGE ESMARK 4X12 BL STRL LF (DISPOSABLE) ×1 IMPLANT
BLADE SURG 15 STRL LF DISP TIS (BLADE) ×1 IMPLANT
BLADE SURG 15 STRL SS (BLADE) ×1
BNDG CONFORM 2 STRL LF (GAUZE/BANDAGES/DRESSINGS) ×2 IMPLANT
BNDG ELASTIC 2X5.8 VLCR NS LF (GAUZE/BANDAGES/DRESSINGS) ×2 IMPLANT
BNDG ESMARK 4X12 BLUE STRL LF (DISPOSABLE) ×2
CHLORAPREP W/TINT 26 (MISCELLANEOUS) ×2 IMPLANT
CLOTH BEACON ORANGE TIMEOUT ST (SAFETY) ×2 IMPLANT
COVER LIGHT HANDLE STERIS (MISCELLANEOUS) ×4 IMPLANT
CUFF TOURN SGL QUICK 18X4 (TOURNIQUET CUFF) ×2 IMPLANT
DECANTER SPIKE VIAL GLASS SM (MISCELLANEOUS) ×2 IMPLANT
DRAPE HALF SHEET 40X57 (DRAPES) ×2 IMPLANT
ELECT REM PT RETURN 9FT ADLT (ELECTROSURGICAL) ×2
ELECTRODE REM PT RTRN 9FT ADLT (ELECTROSURGICAL) ×1 IMPLANT
GAUZE 4X4 16PLY ~~LOC~~+RFID DBL (SPONGE) ×1 IMPLANT
GAUZE SPONGE 4X4 12PLY STRL (GAUZE/BANDAGES/DRESSINGS) ×2 IMPLANT
GAUZE XEROFORM 1X8 LF (GAUZE/BANDAGES/DRESSINGS) ×2 IMPLANT
GLOVE SS N UNI LF 8.5 STRL (GLOVE) ×2 IMPLANT
GLOVE SURG LTX SZ6.5 (GLOVE) ×2 IMPLANT
GLOVE SURG POLYISO LF SZ8 (GLOVE) ×2 IMPLANT
GLOVE SURG UNDER POLY LF SZ7 (GLOVE) ×3 IMPLANT
GOWN STRL REUS W/TWL LRG LVL3 (GOWN DISPOSABLE) ×2 IMPLANT
GOWN STRL REUS W/TWL XL LVL3 (GOWN DISPOSABLE) ×2 IMPLANT
KIT TURNOVER KIT A (KITS) ×2 IMPLANT
MANIFOLD NEPTUNE II (INSTRUMENTS) ×2 IMPLANT
NDL HYPO 21X1.5 SAFETY (NEEDLE) ×1 IMPLANT
NEEDLE HYPO 21X1.5 SAFETY (NEEDLE) ×2 IMPLANT
NS IRRIG 1000ML POUR BTL (IV SOLUTION) ×2 IMPLANT
PACK BASIC LIMB (CUSTOM PROCEDURE TRAY) ×2 IMPLANT
PAD ARMBOARD 7.5X6 YLW CONV (MISCELLANEOUS) ×2 IMPLANT
POSITIONER HAND ALUMI XLG (MISCELLANEOUS) ×2 IMPLANT
SET BASIN LINEN APH (SET/KITS/TRAYS/PACK) ×2 IMPLANT
SPONGE GAUZE 2X2 8PLY STRL LF (GAUZE/BANDAGES/DRESSINGS) ×1 IMPLANT
SUT ETHILON 3 0 FSL (SUTURE) ×2 IMPLANT
SYR CONTROL 10ML LL (SYRINGE) ×2 IMPLANT

## 2021-05-04 NOTE — Anesthesia Postprocedure Evaluation (Signed)
Anesthesia Post Note ? ?Patient: Melanie Schultz ? ?Procedure(s) Performed: RELEASE TRIGGER FINGER/A-1 PULLEY (Left: Finger) ? ?Patient location during evaluation: Phase II ?Anesthesia Type: General ?Level of consciousness: awake ?Pain management: pain level controlled ?Vital Signs Assessment: post-procedure vital signs reviewed and stable ?Respiratory status: spontaneous breathing and respiratory function stable ?Cardiovascular status: blood pressure returned to baseline and stable ?Postop Assessment: no headache and no apparent nausea or vomiting ?Anesthetic complications: no ?Comments: Late entry ? ? ?No notable events documented. ? ? ?Last Vitals:  ?Vitals:  ? 05/04/21 0909 05/04/21 1012  ?BP: (!) 148/88 (!) 164/82  ?Pulse:  80  ?Resp: 18 13  ?Temp: 36.7 ?C 36.7 ?C  ?SpO2: 98% 94%  ?  ?Last Pain:  ?Vitals:  ? 05/04/21 1012  ?TempSrc: Oral  ?PainSc: Asleep  ? ? ?  ?  ?  ?  ?  ?  ? ?Louann Sjogren ? ? ? ? ?

## 2021-05-04 NOTE — Brief Op Note (Signed)
OPERATIVE REPORT  ? ?05/04/2021 ?10:10 AM ?Arther Abbott, MD ? ? ?Preop diagnosis trigger finger left long finger ?Postop diagnosis same  ?Procedure release A1 pulley left long finger ?Surgeon Aline Brochure ?Anesthesia IV sedation with local half percent Marcaine plain ?Operation findings: Stenosing tenosynovitis flexor tendon A1 pulley ?No assistants ?Counts were correct ?Clean case no specimen ?10 mL of Marcaine with epinephrine injected after the case ?Patient to recovery room patient's stable condition ? ?The procedure was performed as follows ? ?The patient was identified in the preop area and the surgical site was confirmed and marked, chart update was completed patient taken to surgery given appropriate antibiotics based on her allergy profile ? ?After IV sedation, sterile prep and drape, timeout the limb was exsanguinated with an Esmarch and the tourniquet was elevated to 250 mmHg ? ?A longitudinal incision was made over the A1 pulley of the left long finger subcutaneous tissue was divided blunt dissection was carried out to protect neurovascular structures. A blunt instrument was placed underneath the A1 pulley and the A1 pulley was released. Flexion extension of the digit confirmed removal of mechanical block. Wound was irrigated and closed with 3-0 nylon suture ? ?We took the patient to recovery room in stable condition ? ?Arther Abbott, MD 05/04/2021 ?

## 2021-05-04 NOTE — Transfer of Care (Signed)
Immediate Anesthesia Transfer of Care Note ? ?Patient: Royetta Crochet Foley ? ?Procedure(s) Performed: RELEASE TRIGGER FINGER/A-1 PULLEY (Left: Finger) ? ?Patient Location: Short Stay ? ?Anesthesia Type:General ? ?Level of Consciousness: awake, alert  and oriented ? ?Airway & Oxygen Therapy: Patient Spontanous Breathing ? ?Post-op Assessment: Report given to RN and Post -op Vital signs reviewed and stable ? ?Post vital signs: Reviewed and stable ? ?Last Vitals:  ?Vitals Value Taken Time  ?BP    ?Temp    ?Pulse    ?Resp    ?SpO2    ? ? ?Last Pain:  ?Vitals:  ? 05/04/21 0909  ?TempSrc: Oral  ?PainSc: 0-No pain  ?   ? ?Patients Stated Pain Goal: 4 (05/04/21 5894) ? ?Complications: No notable events documented. ?

## 2021-05-04 NOTE — Interval H&P Note (Signed)
History and Physical Interval Note: ? ?05/04/2021 ?9:16 AM ? ?Melanie Schultz  has presented today for surgery, with the diagnosis of LEFT long trigger finger.  The various methods of treatment have been discussed with the patient and family. After consideration of risks, benefits and other options for treatment, the patient has consented to  Procedure(s): ?RELEASE TRIGGER FINGER/A-1 PULLEY (Left) long finger as a surgical intervention.  The patient's history has been reviewed, patient examined, no change in status, stable for surgery.  I have reviewed the patient's chart and labs.  Questions were answered to the patient's satisfaction.   ? ? ?Arther Abbott ? ? ?

## 2021-05-04 NOTE — Op Note (Signed)
OPERATIVE REPORT  ? ?05/04/2021 ?10:10 AM ?Arther Abbott, MD ? ? ?Preop diagnosis trigger finger left long finger ?Postop diagnosis same  ?Procedure release A1 pulley left long finger ?Surgeon Aline Brochure ?Anesthesia IV sedation with local half percent Marcaine plain ?Operation findings: Stenosing tenosynovitis flexor tendon A1 pulley ?No assistants ?Counts were correct ?Clean case no specimen ?10 mL of Marcaine with epinephrine injected after the case ?Patient to recovery room patient's stable condition ? ?The procedure was performed as follows ? ?The patient was identified in the preop area and the surgical site was confirmed and marked, chart update was completed patient taken to surgery given appropriate antibiotics based on her allergy profile ? ?After IV sedation, sterile prep and drape, timeout the limb was exsanguinated with an Esmarch and the tourniquet was elevated to 250 mmHg ? ?A longitudinal incision was made over the A1 pulley of the left long finger subcutaneous tissue was divided blunt dissection was carried out to protect neurovascular structures. A blunt instrument was placed underneath the A1 pulley and the A1 pulley was released. Flexion extension of the digit confirmed removal of mechanical block. Wound was irrigated and closed with 3-0 nylon suture ? ?We took the patient to recovery room in stable condition ? ?Arther Abbott, MD 05/04/2021 ?

## 2021-05-04 NOTE — Anesthesia Preprocedure Evaluation (Signed)
Anesthesia Evaluation  ?Patient identified by MRN, date of birth, ID band ?Patient awake ? ? ? ?Reviewed: ?Allergy & Precautions, H&P , NPO status , Patient's Chart, lab work & pertinent test results, reviewed documented beta blocker date and time  ? ?History of Anesthesia Complications ?(+) PONV and history of anesthetic complications ? ?Airway ?Mallampati: II ? ?TM Distance: >3 FB ?Neck ROM: full ? ? ? Dental ?no notable dental hx. ? ?  ?Pulmonary ?sleep apnea , former smoker,  ?  ?Pulmonary exam normal ?breath sounds clear to auscultation ? ? ? ? ? ? Cardiovascular ?Exercise Tolerance: Good ?hypertension, negative cardio ROS ? ? ?Rhythm:regular Rate:Normal ? ? ?  ?Neuro/Psych ?PSYCHIATRIC DISORDERS Anxiety Depression  Neuromuscular disease   ? GI/Hepatic ?Neg liver ROS, GERD  Medicated,  ?Endo/Other  ?diabetes, Type 2Morbid obesity ? Renal/GU ?negative Renal ROS  ?negative genitourinary ?  ?Musculoskeletal ? ? Abdominal ?  ?Peds ? Hematology ?negative hematology ROS ?(+)   ?Anesthesia Other Findings ? ? Reproductive/Obstetrics ?negative OB ROS ? ?  ? ? ? ? ? ? ? ? ? ? ? ? ? ?  ?  ? ? ? ? ? ? ? ? ?Anesthesia Physical ?Anesthesia Plan ? ?ASA: 3 ? ?Anesthesia Plan: General  ? ?Post-op Pain Management:   ? ?Induction:  ? ?PONV Risk Score and Plan: Propofol infusion ? ?Airway Management Planned:  ? ?Additional Equipment:  ? ?Intra-op Plan:  ? ?Post-operative Plan:  ? ?Informed Consent: I have reviewed the patients History and Physical, chart, labs and discussed the procedure including the risks, benefits and alternatives for the proposed anesthesia with the patient or authorized representative who has indicated his/her understanding and acceptance.  ? ? ? ?Dental Advisory Given ? ?Plan Discussed with: CRNA ? ?Anesthesia Plan Comments:   ? ? ? ? ? ? ?Anesthesia Quick Evaluation ? ?

## 2021-05-04 NOTE — Anesthesia Procedure Notes (Signed)
Date/Time: 05/04/2021 9:37 AM ?Performed by: Karna Dupes, CRNA ?Pre-anesthesia Checklist: Patient identified, Emergency Drugs available, Suction available and Patient being monitored ?Patient Re-evaluated:Patient Re-evaluated prior to induction ?Oxygen Delivery Method: Nasal cannula ?Induction Type: IV induction ?Placement Confirmation: positive ETCO2 ?Dental Injury: Teeth and Oropharynx as per pre-operative assessment  ? ? ? ? ?

## 2021-05-07 ENCOUNTER — Encounter (HOSPITAL_COMMUNITY): Payer: Self-pay | Admitting: Orthopedic Surgery

## 2021-05-18 ENCOUNTER — Ambulatory Visit (INDEPENDENT_AMBULATORY_CARE_PROVIDER_SITE_OTHER): Payer: Medicare Other | Admitting: Orthopedic Surgery

## 2021-05-18 DIAGNOSIS — M65332 Trigger finger, left middle finger: Secondary | ICD-10-CM

## 2021-05-18 DIAGNOSIS — Z4889 Encounter for other specified surgical aftercare: Secondary | ICD-10-CM

## 2021-05-18 NOTE — Progress Notes (Signed)
FOLLOW UP  ? ?Encounter Diagnoses  ?Name Primary?  ? Trigger middle finger of left hand Yes  ? Aftercare following surgery   ? ? ? ?Chief Complaint  ?Patient presents with  ? Routine Post Op  ?  DOS 05/04/21 ?LEFT long trigger finger  ? ? ? ?Postop A1 pulley release left long finger.  The incision has opened up a little bit superficially ? ?Sutures were removed ? ?Skin staples applied ? ?Instructions are given for Neosporin and cover with a Band-Aid ? ?Patient having no locking or catching range of motion has returned to normal except for the last 5 degrees of extension ? ?Follow-up in 2 weeks ?

## 2021-05-19 ENCOUNTER — Ambulatory Visit
Admission: EM | Admit: 2021-05-19 | Discharge: 2021-05-19 | Disposition: A | Discharge status: Home / Self Care | Payer: Medicare Other

## 2021-05-19 DIAGNOSIS — T8130XA Disruption of wound, unspecified, initial encounter: Secondary | ICD-10-CM

## 2021-05-19 NOTE — Discharge Instructions (Signed)
Please make sure you follow up with Dr. Aline Brochure as soon as possible to have your wound evaluated and taken care of. In the meantime, change the gauze 3-5 times daily depending on how dirty the wound gets.  Every time you change the gauze you can clean the wound using gentle soap and warm water.  Make sure you leave the derma clip on.  If it falls off then please make sure that you cover the wound back up with a gauze. ?

## 2021-05-19 NOTE — ED Provider Notes (Signed)
?[place] ? ? ?MRN: [id number] DOB: [birth year] ? ?Subjective:  ? ?Melanie Schultz is a 66 y.o. female presenting for wound check.  Patient had a trigger finger release 05/04/2021.  She just had the sutures removed after having them in for 2 weeks.  Has concerns about her wound being open.  The notes from her visit yesterday with Ortho care reported that she had skin staples placed but this is not evident now.  No fever, drainage of pus or bleeding. ? ?No current facility-administered medications for this encounter. ? ?Current Outpatient Medications:  ?  Accu-Chek Softclix Lancets lancets, 100 each by Other route 4 (four) times daily. Use as instructed, Disp: 200 each, Rfl: 3 ?  acetaminophen (TYLENOL) 500 MG tablet, Take 1 tablet (500 mg total) by mouth every 6 (six) hours as needed., Disp: 100 tablet, Rfl: 2 ?  ARIPiprazole (ABILIFY) 2 MG tablet, Take 2 mg by mouth in the morning., Disp: , Rfl:  ?  Ascorbic Acid (VITAMIN C) 1000 MG tablet, Take 1,000 mg by mouth in the morning., Disp: , Rfl:  ?  atenolol (TENORMIN) 50 MG tablet, Take 1 tablet (50 mg total) by mouth 2 (two) times daily., Disp: 180 tablet, Rfl: 0 ?  B-D ULTRAFINE III SHORT PEN 31G X 8 MM MISC, USE 1 PEN NEEDLE 4 TIMES DAILY AS DIRECTED, Disp: 200 each, Rfl: 2 ?  betamethasone valerate ointment (VALISONE) 0.1 %, Apply 1 application topically 2 (two) times daily. Apply to affected skin twice daily for 10-14 days. (Patient taking differently: Apply 1 application. topically 2 (two) times daily as needed (skin moles/blemishes.).), Disp: 60 g, Rfl: 0 ?  Cholecalciferol (VITAMIN D3) 125 MCG (5000 UT) CAPS, Take 5,000 Units by mouth every evening., Disp: , Rfl:  ?  Coenzyme Q10 (COQ-10) 100 MG CAPS, Take 100 mg by mouth at bedtime., Disp: , Rfl:  ?  Cyanocobalamin (VITAMIN B-12) 5000 MCG TBDP, Take 5,000 mcg by mouth in the morning., Disp: , Rfl:  ?  Evening Primrose Oil 1000 MG CAPS, Take 1,000 mg by mouth at bedtime., Disp: , Rfl:  ?   ferrous sulfate 325 (65 FE) MG tablet, Take 325 mg by mouth at bedtime., Disp: , Rfl:  ?  FLUoxetine (PROZAC) 40 MG capsule, Take 40 mg by mouth at bedtime., Disp: , Rfl:  ?  glipiZIDE (GLUCOTROL XL) 5 MG 24 hr tablet, Take 1 tablet (5 mg total) by mouth daily with breakfast., Disp: 90 tablet, Rfl: 3 ?  glucose blood (ACCU-CHEK AVIVA PLUS) test strip, TEST 4 TIMES DAILY DX : E11.65, Disp: 400 strip, Rfl: 3 ?  Homeopathic Products (LIVER SUPPORT SL), Take 2 tablets by mouth in the morning., Disp: , Rfl:  ?  hydrochlorothiazide (HYDRODIURIL) 12.5 MG tablet, Take 12.5 mg by mouth in the morning., Disp: , Rfl:  ?  ibuprofen (ADVIL) 200 MG tablet, Take 600 mg by mouth every 8 (eight) hours as needed (for pain.)., Disp: , Rfl:  ?  insulin lispro (HUMALOG KWIKPEN) 100 UNIT/ML KwikPen, Inject 20-26 Units into the skin 3 (three) times daily with meals., Disp: 30 mL, Rfl: 2 ?  Krill Oil 300 MG CAPS, Take 300 mg by mouth every evening., Disp: , Rfl:  ?  loratadine (CLARITIN) 10 MG tablet, Take 10 mg by mouth every evening., Disp: , Rfl:  ?  losartan (COZAAR) 50 MG tablet, Take 50 mg by mouth every evening., Disp: , Rfl:  ?  magnesium oxide (MAG-OX) 400 MG tablet, Take 400  mg by mouth at bedtime., Disp: , Rfl:  ?  metFORMIN (GLUCOPHAGE-XR) 500 MG 24 hr tablet, TAKE 2 TABLETS BY MOUTH ONCE DAILY WITH BREAKFAST, Disp: 180 tablet, Rfl: 0 ?  methylphenidate (RITALIN) 20 MG tablet, Take 20 mg by mouth 4 (four) times daily., Disp: , Rfl:  ?  Milk Thistle 175 MG CAPS, Take 175 mg by mouth in the morning., Disp: , Rfl:  ?  Multiple Vitamin (MULTIVITAMIN WITH MINERALS) TABS tablet, Take 1 tablet by mouth in the morning. Centrum, Disp: , Rfl:  ?  Multiple Vitamins-Minerals (ZINC PO), Take 1 tablet by mouth in the morning., Disp: , Rfl:  ?  NOVOLOG FLEXPEN 100 UNIT/ML FlexPen, INJECT 20 TO 26 UNITS SUBCUTANEOUSLY THREE TIMES DAILY WITH MEALS, Disp: 30 mL, Rfl: 5 ?  omeprazole (PRILOSEC) 20 MG capsule, Take 20 mg by mouth every evening.,  Disp: , Rfl:  ?  Probiotic Product (PROBIOTIC PO), Take 1 capsule by mouth in the morning., Disp: , Rfl:  ?  rosuvastatin (CRESTOR) 20 MG tablet, Take 20 mg by mouth in the morning., Disp: , Rfl:  ?  Semaglutide,0.25 or 0.'5MG'$ /DOS, (OZEMPIC, 0.25 OR 0.5 MG/DOSE,) 2 MG/1.5ML SOPN, Inject 0.5 mg into the skin once a week. (Patient taking differently: Inject 0.5 mg into the skin every Friday.), Disp: 6 mL, Rfl: 3 ?  Teriflunomide 14 MG TABS, Take 14 mg by mouth at bedtime. ([place]), Disp: , Rfl:  ?  TOUJEO MAX SOLOSTAR 300 UNIT/ML Solostar Pen, INJECT 110 UNITS SUBCUTANEOUSLY AT BEDTIME (Patient taking differently: 90 Units at bedtime.), Disp: 30 mL, Rfl: 1  ? ?Allergies  ?Allergen Reactions  ? Lisinopril Cough  ? Mango Flavor Rash  ?  Mango fruit-rash  ? ? ?Past Medical History:  ?Diagnosis Date  ? Anxiety   ? Depression   ? Diabetes mellitus without complication ([place])   ? GERD (gastroesophageal reflux disease)   ? Hypertension   ? Liver fibrosis   ? Multiple sclerosis ([place])   ? PONV (postoperative nausea and vomiting)   ? Sleep apnea   ?  ? ?Past Surgical History:  ?Procedure Laterality Date  ? BREAST BIOPSY Left   ? BREAST EXCISIONAL BIOPSY Left   ? benign  ? CESAREAN SECTION    ? ENDOMETRIAL ABLATION    ? NASAL SEPTUM SURGERY    ? TRIGGER FINGER RELEASE Left 05/04/2021  ? Procedure: RELEASE TRIGGER FINGER/A-1 PULLEY;  Surgeon: Carole Civil, MD;  Location: [facility];  Service: Orthopedics;  Laterality: Left;  ? ? ?Family History  ?Problem Relation Age of Onset  ? Hypertension Mother   ? Cancer Mother   ? ? ?Social History  ? ?Tobacco Use  ? Smoking status: Former  ?  Types: Cigarettes  ?  Quit date: 02/04/1978  ?  Years since quitting: 43.3  ? Smokeless tobacco: Never  ?Vaping Use  ? Vaping Use: Never used  ?Substance Use Topics  ? Alcohol use: No  ?  Alcohol/week: 0.0 standard drinks  ? Drug use: No  ? ? ?ROS ? ? ?Objective:  ? ?Vitals: ?BP (!) 143/91 (BP Location: Right Arm)   Pulse (!) 105   Temp 98 ?F (36.7  ?C) (Oral)   Resp 18   SpO2 94%  ? ?Physical Exam ?Constitutional:   ?   General: She is not in acute distress. ?   Appearance: Normal appearance. She is well-developed. She is not ill-appearing, toxic-appearing or diaphoretic.  ?HENT:  ?   Head: Normocephalic and atraumatic.  ?  Nose: Nose normal.  ?   Mouth/Throat:  ?   Mouth: Mucous membranes are moist.  ?Eyes:  ?   General: No scleral icterus.    ?   Right eye: No discharge.     ?   Left eye: No discharge.  ?   Extraocular Movements: Extraocular movements intact.  ?Cardiovascular:  ?   Rate and Rhythm: Normal rate.  ?Pulmonary:  ?   Effort: Pulmonary effort is normal.  ?Musculoskeletal:  ?     Hands: ? ?Skin: ?   General: Skin is warm and dry.  ?Neurological:  ?   General: No focal deficit present.  ?   Mental Status: She is alert and oriented to person, place, and time.  ?Psychiatric:     ?   Mood and Affect: Mood normal.     ?   Behavior: Behavior normal.  ? ? ? ? ? ? ? ?Assessment and Plan :  ? ?PDMP not reviewed this encounter. ? ?1. Wound dehiscence   ? ?Wound has depth and I would consider this significant wound dehiscence. A Dermaclip was applied to the wound. Patient requested that I place sutures but I advised that this is medically inappropriate. She needs wound care, follow up with Dr. Aline Brochure for further evaluation and wound care. Counseled patient on potential for adverse effects with medications prescribed/recommended today, ER and return-to-clinic precautions discussed, patient verbalized understanding. ? ?  ?Jaynee Eagles, PA-C ?05/19/21 1607 ? ?

## 2021-05-19 NOTE — ED Triage Notes (Signed)
Pt reports she had sutures removed in the left palm. Pt reports she had surgery for trigger finger in the left middle finger. Pt reports the incision is open.  ?

## 2021-05-21 ENCOUNTER — Ambulatory Visit (INDEPENDENT_AMBULATORY_CARE_PROVIDER_SITE_OTHER): Payer: Medicare Other | Admitting: Orthopedic Surgery

## 2021-05-21 VITALS — Ht 65.0 in | Wt 217.0 lb

## 2021-05-21 DIAGNOSIS — T8130XA Disruption of wound, unspecified, initial encounter: Secondary | ICD-10-CM

## 2021-05-21 MED ORDER — DOXYCYCLINE HYCLATE 100 MG PO TABS: 100.0000 mg | ORAL_TABLET | Freq: Two times a day (BID) | ORAL | 1 refills | Status: DC

## 2021-05-21 NOTE — Patient Instructions (Signed)
Wash the wound with soap and water  ? ?Apply neosporin ? ?Cover with gauze  ? ?Start antibiotic  ? ?Return 1 week  ?

## 2021-05-21 NOTE — Progress Notes (Signed)
FOLLOW UP  ? ?Encounter Diagnosis  ?Name Primary?  ? Wound dehiscence Yes  ? ? ? ?Chief Complaint  ?Patient presents with  ? Wound Check  ?  05/04/2021 left middle trigger finger release. Sutures were removed this past Friday. Noticed incision was opening on Saturday.  Denies any drainage. Went to Urgent Care and they placed steri strip over incision. No infection, redness, etc.  ? ?Melanie Schultz went to urgent care they placed a surgery strip type wound closure over the wound.  Took her out of that and placed her in just gauze wrap and told her to wash the wound with soap and water place Neosporin and keep it covered with gauze start antibiotics return in 1 week ? ?Meds ordered this encounter  ?Medications  ? doxycycline (VIBRA-TABS) 100 MG tablet  ?  Sig: Take 1 tablet (100 mg total) by mouth 2 (two) times daily.  ?  Dispense:  28 tablet  ?  Refill:  1  ? ? ?Encounter Diagnosis  ?Name Primary?  ? Wound dehiscence Yes  ? ? ?

## 2021-05-28 ENCOUNTER — Ambulatory Visit (INDEPENDENT_AMBULATORY_CARE_PROVIDER_SITE_OTHER): Payer: Medicare Other | Admitting: Orthopedic Surgery

## 2021-05-28 DIAGNOSIS — M65332 Trigger finger, left middle finger: Secondary | ICD-10-CM

## 2021-05-28 DIAGNOSIS — T8130XA Disruption of wound, unspecified, initial encounter: Secondary | ICD-10-CM

## 2021-05-28 DIAGNOSIS — Z4889 Encounter for other specified surgical aftercare: Secondary | ICD-10-CM

## 2021-05-28 NOTE — Progress Notes (Signed)
FOLLOW UP  ? ?Encounter Diagnoses  ?Name Primary?  ? Wound dehiscence   ? Trigger middle finger of left hand   ? Aftercare following surgery Yes  ? ? ? ?Chief Complaint  ?Patient presents with  ? Hand Pain  ?  Wound dehiscence left hand s/p trigger finger lt middle finger release ?DOS 05/04/21  ? ?The wound looks clean and is closing  ? ?Return in 2 weeks ? ? ? ?Continue neosporin and ATBX ?

## 2021-05-28 NOTE — Patient Instructions (Signed)
Follow up in 2 weeks for left hand wound check ?

## 2021-06-01 ENCOUNTER — Encounter: Payer: Medicare Other | Admitting: Orthopedic Surgery

## 2021-06-08 ENCOUNTER — Other Ambulatory Visit: Payer: Self-pay | Admitting: Nurse Practitioner

## 2021-06-11 ENCOUNTER — Ambulatory Visit: Payer: Medicare Other | Admitting: Nurse Practitioner

## 2021-06-11 ENCOUNTER — Encounter: Payer: Medicare Other | Admitting: Orthopedic Surgery

## 2021-06-12 ENCOUNTER — Ambulatory Visit: Payer: Medicare Other | Admitting: Nurse Practitioner

## 2021-06-12 ENCOUNTER — Encounter: Payer: Self-pay | Admitting: Nurse Practitioner

## 2021-06-12 ENCOUNTER — Telehealth: Payer: Self-pay | Admitting: Radiology

## 2021-06-12 VITALS — BP 132/81 | HR 96 | Ht 65.0 in | Wt 216.0 lb

## 2021-06-12 DIAGNOSIS — I10 Essential (primary) hypertension: Secondary | ICD-10-CM

## 2021-06-12 DIAGNOSIS — E1169 Type 2 diabetes mellitus with other specified complication: Secondary | ICD-10-CM | POA: Diagnosis not present

## 2021-06-12 DIAGNOSIS — E559 Vitamin D deficiency, unspecified: Secondary | ICD-10-CM | POA: Diagnosis not present

## 2021-06-12 DIAGNOSIS — Z794 Long term (current) use of insulin: Secondary | ICD-10-CM

## 2021-06-12 DIAGNOSIS — E782 Mixed hyperlipidemia: Secondary | ICD-10-CM | POA: Diagnosis not present

## 2021-06-12 NOTE — Patient Instructions (Signed)
Diabetes Mellitus and Foot Care Foot care is an important part of your health, especially when you have diabetes. Diabetes may cause you to have problems because of poor blood flow (circulation) to your feet and legs, which can cause your skin to: Become thinner and drier. Break more easily. Heal more slowly. Peel and crack. You may also have nerve damage (neuropathy) in your legs and feet, causing decreased feeling in them. This means that you may not notice minor injuries to your feet that could lead to more serious problems. Noticing and addressing any potential problems early is the best way to prevent future foot problems. How to care for your feet Foot hygiene  Wash your feet daily with warm water and mild soap. Do not use hot water. Then, pat your feet and the areas between your toes until they are completely dry. Do not soak your feet as this can dry your skin. Trim your toenails straight across. Do not dig under them or around the cuticle. File the edges of your nails with an emery board or nail file. Apply a moisturizing lotion or petroleum jelly to the skin on your feet and to dry, brittle toenails. Use lotion that does not contain alcohol and is unscented. Do not apply lotion between your toes. Shoes and socks Wear clean socks or stockings every day. Make sure they are not too tight. Do not wear knee-high stockings since they may decrease blood flow to your legs. Wear shoes that fit properly and have enough cushioning. Always look in your shoes before you put them on to be sure there are no objects inside. To break in new shoes, wear them for just a few hours a day. This prevents injuries on your feet. Wounds, scrapes, corns, and calluses  Check your feet daily for blisters, cuts, bruises, sores, and redness. If you cannot see the bottom of your feet, use a mirror or ask someone for help. Do not cut corns or calluses or try to remove them with medicine. If you find a minor scrape,  cut, or break in the skin on your feet, keep it and the skin around it clean and dry. You may clean these areas with mild soap and water. Do not clean the area with peroxide, alcohol, or iodine. If you have a wound, scrape, corn, or callus on your foot, look at it several times a day to make sure it is healing and not infected. Check for: Redness, swelling, or pain. Fluid or blood. Warmth. Pus or a bad smell. General tips Do not cross your legs. This may decrease blood flow to your feet. Do not use heating pads or hot water bottles on your feet. They may burn your skin. If you have lost feeling in your feet or legs, you may not know this is happening until it is too late. Protect your feet from hot and cold by wearing shoes, such as at the beach or on hot pavement. Schedule a complete foot exam at least once a year (annually) or more often if you have foot problems. Report any cuts, sores, or bruises to your health care provider immediately. Where to find more information American Diabetes Association: www.diabetes.org Association of Diabetes Care & Education Specialists: www.diabeteseducator.org Contact a health care provider if: You have a medical condition that increases your risk of infection and you have any cuts, sores, or bruises on your feet. You have an injury that is not healing. You have redness on your legs or feet. You   feel burning or tingling in your legs or feet. You have pain or cramps in your legs and feet. Your legs or feet are numb. Your feet always feel cold. You have pain around any toenails. Get help right away if: You have a wound, scrape, corn, or callus on your foot and: You have pain, swelling, or redness that gets worse. You have fluid or blood coming from the wound, scrape, corn, or callus. Your wound, scrape, corn, or callus feels warm to the touch. You have pus or a bad smell coming from the wound, scrape, corn, or callus. You have a fever. You have a red  line going up your leg. Summary Check your feet every day for blisters, cuts, bruises, sores, and redness. Apply a moisturizing lotion or petroleum jelly to the skin on your feet and to dry, brittle toenails. Wear shoes that fit properly and have enough cushioning. If you have foot problems, report any cuts, sores, or bruises to your health care provider immediately. Schedule a complete foot exam at least once a year (annually) or more often if you have foot problems. This information is not intended to replace advice given to you by your health care provider. Make sure you discuss any questions you have with your health care provider. Document Revised: 08/12/2019 Document Reviewed: 08/12/2019 Elsevier Patient Education  2023 Elsevier Inc.  

## 2021-06-12 NOTE — Telephone Encounter (Signed)
Dr Dorthey Sawyer (not sure of last name) called, and asked if patient can be seen for her neck when she comes in for postop appt Wed 5/10.  She has had 8 day onset severe pain neck.  Shoulder ABD ext difficult.  Bicep weak.  He did not say R or L.  She has seen an urgent care and was given steroid injection IM.   ?Please advise if ok to see tomorrow or we need to schedule a separate appt for her?   ?

## 2021-06-12 NOTE — Progress Notes (Signed)
?06/12/2021 ? ? ?Endocrinology follow-up note ? ? ? ?Subjective:  ? ? Patient ID: Melanie Schultz, female    DOB: 08/25/1955,  ?Melanie Schultz presents today for follow up of uncontrolled type 2 diabetes, hypertension, and hyperlipidemia. ? ?Past Medical History:  ?Diagnosis Date  ? Anxiety   ? Depression   ? Diabetes mellitus without complication ([place])   ? GERD (gastroesophageal reflux disease)   ? Hypertension   ? Liver fibrosis   ? Multiple sclerosis ([place])   ? PONV (postoperative nausea and vomiting)   ? Sleep apnea   ? ?Past Surgical History:  ?Procedure Laterality Date  ? BREAST BIOPSY Left   ? BREAST EXCISIONAL BIOPSY Left   ? benign  ? CESAREAN SECTION    ? ENDOMETRIAL ABLATION    ? NASAL SEPTUM SURGERY    ? TRIGGER FINGER RELEASE Left 05/04/2021  ? Procedure: RELEASE TRIGGER FINGER/A-1 PULLEY;  Surgeon: Carole Civil, MD;  Location: [facility];  Service: Orthopedics;  Laterality: Left;  ? ?Social History  ? ?Socioeconomic History  ? Marital status: Divorced  ?  Spouse name: Not on file  ? Number of children: Not on file  ? Years of education: Not on file  ? Highest education level: Not on file  ?Occupational History  ? Not on file  ?Tobacco Use  ? Smoking status: Former  ?  Types: Cigarettes  ?  Quit date: 02/04/1978  ?  Years since quitting: 43.3  ? Smokeless tobacco: Never  ?Vaping Use  ? Vaping Use: Never used  ?Substance and Sexual Activity  ? Alcohol use: No  ?  Alcohol/week: 0.0 standard drinks  ? Drug use: No  ? Sexual activity: Not on file  ?Other Topics Concern  ? Not on file  ?Social History Narrative  ? Not on file  ? ?Social Determinants of Health  ? ?Financial Resource Strain: Not on file  ?Food Insecurity: Not on file  ?Transportation Needs: Not on file  ?Physical Activity: Not on file  ?Stress: Not on file  ?Social Connections: Not on file  ? ?Outpatient Encounter Medications as of 06/12/2021  ?Medication Sig  ? Accu-Chek Softclix Lancets lancets 100 each by Other route 4 (four) times daily. Use as  instructed  ? acetaminophen (TYLENOL) 500 MG tablet Take 1 tablet (500 mg total) by mouth every 6 (six) hours as needed.  ? ARIPiprazole (ABILIFY) 2 MG tablet Take 2 mg by mouth in the morning.  ? Ascorbic Acid (VITAMIN C) 1000 MG tablet Take 1,000 mg by mouth in the morning.  ? atenolol (TENORMIN) 50 MG tablet Take 1 tablet (50 mg total) by mouth 2 (two) times daily.  ? B-D ULTRAFINE III SHORT PEN 31G X 8 MM MISC USE 1 PEN NEEDLE 4 TIMES DAILY AS DIRECTED  ? betamethasone valerate ointment (VALISONE) 0.1 % Apply 1 application topically 2 (two) times daily. Apply to affected skin twice daily for 10-14 days. (Patient taking differently: Apply 1 application. topically 2 (two) times daily as needed (skin moles/blemishes.).)  ? Cholecalciferol (VITAMIN D3) 125 MCG (5000 UT) CAPS Take 5,000 Units by mouth every evening.  ? Coenzyme Q10 (COQ-10) 100 MG CAPS Take 100 mg by mouth at bedtime.  ? Cyanocobalamin (VITAMIN B-12) 5000 MCG TBDP Take 5,000 mcg by mouth in the morning.  ? doxycycline (VIBRA-TABS) 100 MG tablet Take 1 tablet (100 mg total) by mouth 2 (two) times daily.  ? Evening Primrose Oil 1000 MG CAPS Take 1,000 mg by mouth at  bedtime.  ? ferrous sulfate 325 (65 FE) MG tablet Take 325 mg by mouth at bedtime.  ? FLUoxetine (PROZAC) 40 MG capsule Take 40 mg by mouth at bedtime.  ? glipiZIDE (GLUCOTROL XL) 5 MG 24 hr tablet Take 1 tablet (5 mg total) by mouth daily with breakfast.  ? glucose blood (ACCU-CHEK AVIVA PLUS) test strip TEST 4 TIMES DAILY DX : E11.65  ? Homeopathic Products (LIVER SUPPORT SL) Take 2 tablets by mouth in the morning.  ? hydrochlorothiazide (HYDRODIURIL) 12.5 MG tablet Take 12.5 mg by mouth in the morning.  ? ibuprofen (ADVIL) 200 MG tablet Take 600 mg by mouth every 8 (eight) hours as needed (for pain.).  ? insulin lispro (HUMALOG KWIKPEN) 100 UNIT/ML KwikPen Inject 20-26 Units into the skin 3 (three) times daily with meals.  ? Krill Oil 300 MG CAPS Take 300 mg by mouth every evening.  ?  loratadine (CLARITIN) 10 MG tablet Take 10 mg by mouth every evening.  ? losartan (COZAAR) 50 MG tablet Take 50 mg by mouth every evening.  ? magnesium oxide (MAG-OX) 400 MG tablet Take 400 mg by mouth at bedtime.  ? metFORMIN (GLUCOPHAGE-XR) 500 MG 24 hr tablet TAKE 2 TABLETS BY MOUTH ONCE DAILY WITH BREAKFAST  ? methylphenidate (RITALIN) 20 MG tablet Take 20 mg by mouth 4 (four) times daily.  ? Milk Thistle 175 MG CAPS Take 175 mg by mouth in the morning.  ? Multiple Vitamin (MULTIVITAMIN WITH MINERALS) TABS tablet Take 1 tablet by mouth in the morning. Centrum  ? Multiple Vitamins-Minerals (ZINC PO) Take 1 tablet by mouth in the morning.  ? omeprazole (PRILOSEC) 20 MG capsule Take 20 mg by mouth every evening.  ? Probiotic Product (PROBIOTIC PO) Take 1 capsule by mouth in the morning.  ? rosuvastatin (CRESTOR) 20 MG tablet Take 20 mg by mouth in the morning.  ? Semaglutide,0.25 or 0.'5MG'$ /DOS, (OZEMPIC, 0.25 OR 0.5 MG/DOSE,) 2 MG/1.5ML SOPN Inject 0.5 mg into the skin once a week. (Patient taking differently: Inject 0.5 mg into the skin every Friday.)  ? Teriflunomide 14 MG TABS Take 14 mg by mouth at bedtime. (Aubagio)  ? TOUJEO MAX SOLOSTAR 300 UNIT/ML Solostar Pen INJECT 110 UNITS SUBCUTANEOUSLY AT BEDTIME (Patient taking differently: 90 Units at bedtime.)  ? [DISCONTINUED] NOVOLOG FLEXPEN 100 UNIT/ML FlexPen INJECT 20 TO 26 UNITS SUBCUTANEOUSLY THREE TIMES DAILY WITH MEALS  ? ?No facility-administered encounter medications on file as of 06/12/2021.  ? ?ALLERGIES: ?Allergies  ?Allergen Reactions  ? Lisinopril Cough  ?  Other reaction(s): cough  ? Mango Flavor Rash  ?  Mango fruit-rash  ? ?VACCINATION STATUS: ? ?There is no immunization history on file for this patient. ? ?Diabetes ?She presents for her follow-up diabetic visit. She has type 2 diabetes mellitus. Onset time: She was diagnosed at approximate age of 73 years. Her disease course has been improving. There are no hypoglycemic associated symptoms.  Pertinent negatives for hypoglycemia include no confusion, headaches, pallor or seizures. Associated symptoms include foot paresthesias and weight loss. Pertinent negatives for diabetes include no chest pain, no fatigue, no polydipsia, no polyphagia and no polyuria. There are no hypoglycemic complications. Symptoms are stable. Diabetic complications include nephropathy and peripheral neuropathy. Risk factors for coronary artery disease include dyslipidemia, hypertension, obesity, sedentary lifestyle and diabetes mellitus. Current diabetic treatment includes intensive insulin program and oral agent (monotherapy) (and Ozempic). She is compliant with treatment most of the time. Her weight is increasing steadily. She is following a generally unhealthy  diet. When asked about meal planning, she reported none. She has had a previous visit with a dietitian. She participates in exercise intermittently. Her home blood glucose trend is fluctuating minimally. Her overall blood glucose range is 180-200 mg/dl. (She presents today with her meter and logs showing improving glycemic profile overall.  Her previsit A1c checked on 3/29 was 8%, improving from last visit of 8.9%.  She has had surgery and steroid injection since last visit, driving up her readings some, but they are getting back to normal now.  She has also tolerated switching to Ozempic well, only having minimal nausea.  She denies any hypoglycemia.  Analysis of her meter shows 7-day average of 201, 14-day average of 184, 30-day average of 174, 90-day average of 167.) An ACE inhibitor/angiotensin II receptor blocker is being taken. She does not see a podiatrist.Eye exam is current.  ?Hyperlipidemia ?This is a chronic problem. The current episode started more than 1 year ago. The problem is uncontrolled. Recent lipid tests were reviewed and are variable. Exacerbating diseases include chronic renal disease, diabetes and obesity. Factors aggravating her hyperlipidemia  include fatty foods, beta blockers and thiazides. Pertinent negatives include no chest pain, myalgias or shortness of breath. Current antihyperlipidemic treatment includes statins. The current treatment provides mi

## 2021-06-12 NOTE — Telephone Encounter (Signed)
Here is what I would say: Dr. Aline Brochure does not take care spine problems but is happy to look at it.

## 2021-06-13 ENCOUNTER — Ambulatory Visit (INDEPENDENT_AMBULATORY_CARE_PROVIDER_SITE_OTHER): Payer: Medicare Other | Admitting: Orthopedic Surgery

## 2021-06-13 DIAGNOSIS — M542 Cervicalgia: Secondary | ICD-10-CM

## 2021-06-13 DIAGNOSIS — M50123 Cervical disc disorder at C6-C7 level with radiculopathy: Secondary | ICD-10-CM

## 2021-06-13 NOTE — Progress Notes (Signed)
Chief Complaint  ?Patient presents with  ? Wound Check  ?  Recheck on left hand, DOS 05-04-21.  ? ? ?Postop trigger finger release on 31 March wound seems to have closed nicely.  May be just a few millimeters before it completely closes over ? ?Excellent range of motion in the left long finger status post trigger release ? ?Patient came in today with with pain in her cervical spine rating to her right arm into the forearm with weakness and decreased range of motion and painful range of motion in the right shoulder. ? ?She did have some issues with her neck in the past from an accident which required a use traction device and various modalities to relieve pain but that was several years ago ? ?She was seen at [facility] urgent care where she received IM injections of cortisone and probably Toradol ? ?She has continued with topical medication as well as muscle relaxers and ibuprofen and Tylenol ? ?Physical exam ? ?Tenderness on the right lower cervical spine right upper trap tenderness over the right deltoid and anterior deltoid ? ?Normal passive range of motion with the arm at the side and internal/external rotation some pain with forward elevation of the right arm in abduction with weakness of abduction mainly C5 ? ?Grip strength may be a little weak on the right as well ? ?Encounter Diagnosis  ?Name Primary?  ? 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 ? ? ?Imaging ? ?Recommend MRI ? ?Continue heat ? ?Ibuprofen ? ?Muscle relaxer ? ?Discuss referral after mri  ?

## 2021-06-13 NOTE — Patient Instructions (Addendum)
Recommend MRI of the cervical spine ? ?Once I get the results I will call you on Wednesday afternoon or Thursday morning after the MRI ? ?While we are working on your approval for MRI please go ahead and call to schedule your appointment with [place] [facility] within at least one (1) week.  ? ?Central Scheduling ?[phone]  ?

## 2021-06-22 ENCOUNTER — Other Ambulatory Visit: Payer: Self-pay | Admitting: Nurse Practitioner

## 2021-06-22 DIAGNOSIS — E118 Type 2 diabetes mellitus with unspecified complications: Secondary | ICD-10-CM

## 2021-06-29 ENCOUNTER — Ambulatory Visit (HOSPITAL_COMMUNITY)
Admission: RE | Admit: 2021-06-29 | Discharge: 2021-06-29 | Disposition: A | Discharge status: Home / Self Care | Payer: Medicare Other | Source: Ambulatory Visit | Attending: Orthopedic Surgery | Admitting: Orthopedic Surgery

## 2021-06-29 DIAGNOSIS — M50123 Cervical disc disorder at C6-C7 level with radiculopathy: Secondary | ICD-10-CM | POA: Insufficient documentation

## 2021-06-29 DIAGNOSIS — M542 Cervicalgia: Secondary | ICD-10-CM | POA: Diagnosis present

## 2021-07-05 ENCOUNTER — Telehealth: Payer: Self-pay | Admitting: Radiology

## 2021-07-05 ENCOUNTER — Telehealth: Payer: Self-pay | Admitting: Orthopedic Surgery

## 2021-07-05 DIAGNOSIS — M4802 Spinal stenosis, cervical region: Secondary | ICD-10-CM

## 2021-07-05 NOTE — Telephone Encounter (Signed)
The patient has cervical spinal stenosis  Results reviewed with the patient  Report below  Patient referred to [place] [facility]  CLINICAL DATA:  Neck pain   EXAM: MRI CERVICAL SPINE WITHOUT CONTRAST   TECHNIQUE: Multiplanar, multisequence MR imaging of the cervical spine was performed. No intravenous contrast was administered.   COMPARISON:  12/17/2013   FINDINGS: Alignment: Straightening of the normal cervical lordosis. Trace retrolisthesis of C5 on C6, unchanged.   Vertebrae: No acute fracture or suspicious osseous lesion.   Cord: Small focus of T2 hyperintense signal in the left dorsal cord at C4-C5 is only seen on a single slice and is not corroborated on the sagittal images, favored to be artifactual. Otherwise normal in caliber and signal.   Posterior Fossa, vertebral arteries, paraspinal tissues: Negative.   Disc levels:   C2-C3: No significant disc bulge. No spinal canal stenosis or neuroforaminal narrowing.   C3-C4: No significant disc bulge. No spinal canal stenosis or neuroforaminal narrowing.   C4-C5: Mild disc bulge, mild spinal canal stenosis, and mild right neural foraminal narrowing which are new from prior exam.   C5-C6: Trace retrolisthesis with disc height loss and disc osteophyte complex with superimposed central disc protrusion. Uncovertebral and facet arthropathy. Moderate spinal canal stenosis and mild bilateral neural foraminal narrowing, which appears similar to the prior.   C6-C7: Broad-based disc bulge with superimposed left paracentral disc protrusion. Uncovertebral and facet arthropathy. Mild spinal canal stenosis, with moderate left and mild right neural foraminal narrowing, which has progressed from prior exam.   C7-T1: Central disc extrusion, which measures up to 7 mm in AP dimension, previously 6 mm, with 8 mm of cranial extension and 5 mm of caudal extension of previously 7 mm cranial and 4 mm caudal, overall slightly  increased in size compared to the prior exam. This indents and mildly deforms the ventral cord. Moderate spinal canal stenosis. Uncovertebral hypertrophy. Mild right-greater-than-left neural foraminal narrowing, which is new from the prior exam   IMPRESSION: 1. C7-T1 moderate spinal canal stenosis with mild bilateral neural foraminal narrowing. 2. C5-C6 moderate spinal canal stenosis and mild bilateral neural foraminal narrowing. 3. C6-C7 mild spinal canal stenosis, with moderate left and mild right neural foraminal narrowing. 4. C4-C5 mild spinal canal stenosis and mild right neural foraminal narrowing.     Electronically Signed   By: Merilyn Baba M.D.   On: 07/01/2021 03:15

## 2021-07-05 NOTE — Telephone Encounter (Signed)
-----   Message from Carole Civil, MD sent at 07/05/2021  9:22 AM EDT ----- Referred to [place] [facility]  Cervical spinal stenosis

## 2021-07-12 ENCOUNTER — Other Ambulatory Visit: Payer: Self-pay

## 2021-07-12 ENCOUNTER — Telehealth: Payer: Self-pay | Admitting: Nurse Practitioner

## 2021-07-12 MED ORDER — SEMAGLUTIDE (1 MG/DOSE) 2 MG/1.5ML ~~LOC~~ SOPN: 1.0000 mg | PEN_INJECTOR | SUBCUTANEOUS | 2 refills | Status: DC

## 2021-07-12 MED ORDER — OZEMPIC (0.25 OR 0.5 MG/DOSE) 2 MG/1.5ML ~~LOC~~ SOPN: 0.5000 mg | PEN_INJECTOR | SUBCUTANEOUS | 0 refills | Status: DC

## 2021-07-12 NOTE — Telephone Encounter (Signed)
Pt called and states you changed her dosage of Ozempic and she is needing a new rx sent to pharmacy.   [place], [place] - [id number] [place] [address] Phone:  [phone]  Fax:  [phone]

## 2021-07-12 NOTE — Telephone Encounter (Signed)
Rx sent 

## 2021-07-17 ENCOUNTER — Other Ambulatory Visit: Payer: Self-pay | Admitting: Nurse Practitioner

## 2021-07-25 LAB — LIPID PANEL
Cholesterol: 143 (ref 0–200)
HDL: 39 (ref 35–70)
LDL Cholesterol: 80
LDl/HDL Ratio: 77
Triglycerides: 267 — AB (ref 40–160)
Triglycerides: 244 — AB (ref 40–160)

## 2021-07-25 LAB — PROTEIN / CREATININE RATIO, URINE: Creatinine, Urine: 44

## 2021-07-25 LAB — CBC AND DIFFERENTIAL
HCT: 39 (ref 36–46)
Hemoglobin: 12.8 (ref 12.0–16.0)
Platelets: 286 10*3/uL (ref 150–400)
WBC: 12

## 2021-07-25 LAB — TSH: TSH: 2.74 (ref 0.41–5.90)

## 2021-07-25 LAB — BASIC METABOLIC PANEL
BUN: 9 (ref 4–21)
Chloride: 96 — AB (ref 99–108)
Creatinine: 0.8 (ref 0.5–1.1)
Glucose: 210

## 2021-07-25 LAB — COMPREHENSIVE METABOLIC PANEL
Calcium: 10.1 (ref 8.7–10.7)
eGFR: 87

## 2021-07-25 LAB — MICROALBUMIN, URINE: Microalb, Ur: 0.6

## 2021-07-25 LAB — MICROALBUMIN / CREATININE URINE RATIO: Microalb Creat Ratio: 14

## 2021-07-26 LAB — LAB REPORT - SCANNED
A1c: 7.6
Calcium: 10.1
Creatinine, POC: 44 mg/dL
EGFR: 87
Microalb Creat Ratio: 14
Microalbumin, Urine: 0.6
TSH: 2.74 (ref 0.41–5.90)

## 2021-07-30 ENCOUNTER — Other Ambulatory Visit: Payer: Self-pay

## 2021-07-30 MED ORDER — INSULIN LISPRO (1 UNIT DIAL) 100 UNIT/ML (KWIKPEN): 20.0000 [IU] | PEN_INJECTOR | Freq: Three times a day (TID) | SUBCUTANEOUS | 2 refills | Status: DC

## 2021-08-14 ENCOUNTER — Ambulatory Visit: Payer: Medicare Other | Admitting: Orthopedic Surgery

## 2021-08-14 ENCOUNTER — Encounter: Payer: Self-pay | Admitting: Orthopedic Surgery

## 2021-08-14 ENCOUNTER — Ambulatory Visit (INDEPENDENT_AMBULATORY_CARE_PROVIDER_SITE_OTHER): Payer: Medicare Other

## 2021-08-14 DIAGNOSIS — M25511 Pain in right shoulder: Secondary | ICD-10-CM

## 2021-08-14 NOTE — Progress Notes (Signed)
New Patient Visit  Assessment: Melanie Schultz is a 66 y.o. female with the following: 1. Right shoulder pain, unspecified chronicity  Plan: Melanie Schultz has some pain in the posterior lateral aspect of the right shoulder.  She states that her pain and motion has progressively improved.  Radiographs are without acute injury.  She may have an injury to the rotator cuff, but given her current level of strength and range of motion, I am reluctant to do anything.  If her pain does not continue to improve, we can consider an injection.  She did express an interest in an MRI, I told her we have to try some other things including an injection and/or therapy prior to getting this authorized.  Follow-up as needed.  Follow-up: Return if symptoms worsen or fail to improve.  Subjective:  Chief Complaint  Patient presents with   Shoulder Pain    RT shoulder/ pain started ~6 weeks ago (around the last time she saw Dr.H) however pain has really improved    History of Present Illness: Melanie Schultz is a 66 y.o. female who has been referred by Gypsy Decant, [place] for evaluation of right shoulder pain.  She states she has had right shoulder pain for couple of months.  It has progressively improved.  No specific injury.  She states she woke up 1 morning, and her pain was worse.  She is modifying the way she sleeps.  Taking medicines intermittently.  She has worked with Dr. Lovena Le, including some exercises for her right shoulder.  Overall, she feels much better.  She does continue to have some pain in the posterior lateral aspect of the right shoulder.  Her overall function is good at this time.   Review of Systems: No fevers or chills No numbness or tingling No chest pain No shortness of breath No bowel or bladder dysfunction No GI distress No headaches   Medical History:  Past Medical History:  Diagnosis Date   Anxiety    Depression    Diabetes mellitus without complication (HCC)    GERD  (gastroesophageal reflux disease)    Hypertension    Liver fibrosis    Multiple sclerosis (HCC)    PONV (postoperative nausea and vomiting)    Sleep apnea     Past Surgical History:  Procedure Laterality Date   BREAST BIOPSY Left    BREAST EXCISIONAL BIOPSY Left    benign   CESAREAN SECTION     ENDOMETRIAL ABLATION     NASAL SEPTUM SURGERY     TRIGGER FINGER RELEASE Left 05/04/2021   Procedure: RELEASE TRIGGER FINGER/A-1 PULLEY;  Surgeon: Carole Civil, MD;  Location: [facility];  Service: Orthopedics;  Laterality: Left;    Family History  Problem Relation Age of Onset   Hypertension Mother    Cancer Mother    Social History   Tobacco Use   Smoking status: Former    Types: Cigarettes    Quit date: 02/04/1978    Years since quitting: 43.5   Smokeless tobacco: Never  Vaping Use   Vaping Use: Never used  Substance Use Topics   Alcohol use: No    Alcohol/week: 0.0 standard drinks of alcohol   Drug use: No    Allergies  Allergen Reactions   Lisinopril Cough    Other reaction(s): cough   Mango Flavor Rash    Mango fruit-rash    Current Meds  Medication Sig   Accu-Chek Softclix Lancets lancets 100 each by Other route  4 (four) times daily. Use as instructed   acetaminophen (TYLENOL) 500 MG tablet Take 1 tablet (500 mg total) by mouth every 6 (six) hours as needed.   ARIPiprazole (ABILIFY) 2 MG tablet Take 2 mg by mouth in the morning.   Ascorbic Acid (VITAMIN C) 1000 MG tablet Take 1,000 mg by mouth in the morning.   atenolol (TENORMIN) 50 MG tablet Take 1 tablet (50 mg total) by mouth 2 (two) times daily.   B-D ULTRAFINE III SHORT PEN 31G X 8 MM MISC USE 1 PEN NEEDLE 4 TIMES DAILY AS DIRECTED   betamethasone valerate ointment (VALISONE) 0.1 % Apply 1 application topically 2 (two) times daily. Apply to affected skin twice daily for 10-14 days. (Patient taking differently: Apply 1 application  topically 2 (two) times daily as needed (skin moles/blemishes.).)    Cholecalciferol (VITAMIN D3) 125 MCG (5000 UT) CAPS Take 5,000 Units by mouth every evening.   Coenzyme Q10 (COQ-10) 100 MG CAPS Take 100 mg by mouth at bedtime.   Cyanocobalamin (VITAMIN B-12) 5000 MCG TBDP Take 5,000 mcg by mouth in the morning.   doxycycline (VIBRA-TABS) 100 MG tablet Take 1 tablet (100 mg total) by mouth 2 (two) times daily.   Evening Primrose Oil 1000 MG CAPS Take 1,000 mg by mouth at bedtime.   ferrous sulfate 325 (65 FE) MG tablet Take 325 mg by mouth at bedtime.   FLUoxetine (PROZAC) 40 MG capsule Take 40 mg by mouth at bedtime.   glipiZIDE (GLUCOTROL XL) 5 MG 24 hr tablet Take 1 tablet (5 mg total) by mouth daily with breakfast.   glucose blood (ACCU-CHEK AVIVA PLUS) test strip TEST 4 TIMES DAILY DX : E11.65   Homeopathic Products (LIVER SUPPORT SL) Take 2 tablets by mouth in the morning.   hydrochlorothiazide (HYDRODIURIL) 12.5 MG tablet Take 12.5 mg by mouth in the morning.   ibuprofen (ADVIL) 200 MG tablet Take 600 mg by mouth every 8 (eight) hours as needed (for pain.).   insulin lispro (HUMALOG KWIKPEN) 100 UNIT/ML KwikPen Inject 20-26 Units into the skin 3 (three) times daily with meals.   Krill Oil 300 MG CAPS Take 300 mg by mouth every evening.   loratadine (CLARITIN) 10 MG tablet Take 10 mg by mouth every evening.   losartan (COZAAR) 50 MG tablet Take 50 mg by mouth every evening.   magnesium oxide (MAG-OX) 400 MG tablet Take 400 mg by mouth at bedtime.   metFORMIN (GLUCOPHAGE-XR) 500 MG 24 hr tablet TAKE 2 TABLETS BY MOUTH ONCE DAILY WITH BREAKFAST   methylphenidate (RITALIN) 20 MG tablet Take 20 mg by mouth 4 (four) times daily.   Milk Thistle 175 MG CAPS Take 175 mg by mouth in the morning.   Multiple Vitamin (MULTIVITAMIN WITH MINERALS) TABS tablet Take 1 tablet by mouth in the morning. Centrum   Multiple Vitamins-Minerals (ZINC PO) Take 1 tablet by mouth in the morning.   omeprazole (PRILOSEC) 20 MG capsule Take 20 mg by mouth every evening.   Probiotic  Product (PROBIOTIC PO) Take 1 capsule by mouth in the morning.   rosuvastatin (CRESTOR) 20 MG tablet Take 20 mg by mouth in the morning.   Semaglutide, 1 MG/DOSE, 2 MG/1.5ML SOPN Inject 1 mg into the skin once a week.   Teriflunomide 14 MG TABS Take 14 mg by mouth at bedtime. ([place]agio)   TOUJEO MAX SOLOSTAR 300 UNIT/ML Solostar Pen INJECT 110 UNITS SUBCUTANEOUSLY AT BEDTIME    Objective: There were no vitals taken for  this visit.  Physical Exam:  General: Alert and oriented. and No acute distress. Gait: Normal gait.  Right shoulder without deformity.  Tenderness to palpation of the posterior lateral aspect of the shoulder.  160 degrees of forward flexion.  100 degrees of abduction.  Internal rotation to T12.  45 degrees of external rotation at her side.  Strength is good.  Fingers warm and well-perfused.  Sensation is intact throughout the right hand.  IMAGING: I personally ordered and reviewed the following images  X-rays of the right shoulder were obtained in clinic today.  No acute injuries are noted.  Maintained glenohumeral joint space.  Minimal degenerative changes of the [facility] joint.  No evidence of proximal humeral migration.  Impression: Negative right shoulder x-ray   New Medications:  No orders of the defined types were placed in this encounter.     Mordecai Rasmussen, MD  08/14/2021 2:34 PM

## 2021-08-14 NOTE — Patient Instructions (Signed)
Please call the clinic if you shoulder pain worsens or fails to get better  We can consider an injection or PT  Most insurance companies will require one or both prior to ordering an MRI

## 2021-09-05 ENCOUNTER — Other Ambulatory Visit: Payer: Self-pay | Admitting: Nurse Practitioner

## 2021-09-13 ENCOUNTER — Ambulatory Visit: Payer: Medicare Other | Admitting: Nurse Practitioner

## 2021-10-01 ENCOUNTER — Encounter: Payer: Self-pay | Admitting: Nurse Practitioner

## 2021-10-01 ENCOUNTER — Ambulatory Visit: Payer: Medicare Other | Admitting: Nurse Practitioner

## 2021-10-01 VITALS — BP 149/80 | HR 89 | Ht 65.0 in | Wt 213.0 lb

## 2021-10-01 DIAGNOSIS — E1169 Type 2 diabetes mellitus with other specified complication: Secondary | ICD-10-CM

## 2021-10-01 DIAGNOSIS — E118 Type 2 diabetes mellitus with unspecified complications: Secondary | ICD-10-CM

## 2021-10-01 DIAGNOSIS — E559 Vitamin D deficiency, unspecified: Secondary | ICD-10-CM | POA: Diagnosis not present

## 2021-10-01 DIAGNOSIS — E782 Mixed hyperlipidemia: Secondary | ICD-10-CM | POA: Diagnosis not present

## 2021-10-01 DIAGNOSIS — Z794 Long term (current) use of insulin: Secondary | ICD-10-CM

## 2021-10-01 DIAGNOSIS — I10 Essential (primary) hypertension: Secondary | ICD-10-CM | POA: Diagnosis not present

## 2021-10-01 LAB — POCT GLYCOSYLATED HEMOGLOBIN (HGB A1C): Hemoglobin A1C: 7.3 % — AB (ref 4.0–5.6)

## 2021-10-01 MED ORDER — TOUJEO MAX SOLOSTAR 300 UNIT/ML ~~LOC~~ SOPN: 90.0000 [IU] | PEN_INJECTOR | Freq: Every evening | SUBCUTANEOUS | 3 refills | Status: DC

## 2021-10-01 MED ORDER — INSULIN LISPRO (1 UNIT DIAL) 100 UNIT/ML (KWIKPEN): 22.0000 [IU] | PEN_INJECTOR | Freq: Three times a day (TID) | SUBCUTANEOUS | 3 refills | Status: DC

## 2021-10-01 MED ORDER — METFORMIN HCL ER 500 MG PO TB24: ORAL_TABLET | ORAL | 3 refills | Status: DC

## 2021-10-01 NOTE — Progress Notes (Signed)
10/01/2021   Endocrinology follow-up note    Subjective:    Patient ID: Melanie Schultz, female    DOB: 06/25/1955,  Melanie Schultz presents today for follow up of uncontrolled type 2 diabetes, hypertension, and hyperlipidemia.  Past Medical History:  Diagnosis Date   Anxiety    Depression    Diabetes mellitus without complication (HCC)    GERD (gastroesophageal reflux disease)    Hypertension    Liver fibrosis    Multiple sclerosis (HCC)    PONV (postoperative nausea and vomiting)    Sleep apnea    Past Surgical History:  Procedure Laterality Date   BREAST BIOPSY Left    BREAST EXCISIONAL BIOPSY Left    benign   CESAREAN SECTION     ENDOMETRIAL ABLATION     NASAL SEPTUM SURGERY     TRIGGER FINGER RELEASE Left 05/04/2021   Procedure: RELEASE TRIGGER FINGER/A-1 PULLEY;  Surgeon: Carole Civil, MD;  Location: [facility];  Service: Orthopedics;  Laterality: Left;   Social History   Socioeconomic History   Marital status: Divorced    Spouse name: Not on file   Number of children: Not on file   Years of education: Not on file   Highest education level: Not on file  Occupational History   Not on file  Tobacco Use   Smoking status: Former    Types: Cigarettes    Quit date: 02/04/1978    Years since quitting: 43.6   Smokeless tobacco: Never  Vaping Use   Vaping Use: Never used  Substance and Sexual Activity   Alcohol use: No    Alcohol/week: 0.0 standard drinks of alcohol   Drug use: No   Sexual activity: Not on file  Other Topics Concern   Not on file  Social History Narrative   Not on file   Social Determinants of Health   Financial Resource Strain: Not on file  Food Insecurity: Not on file  Transportation Needs: Not on file  Physical Activity: Not on file  Stress: Not on file  Social Connections: Not on file   Outpatient Encounter Medications as of 10/01/2021  Medication Sig   Accu-Chek Softclix Lancets lancets 100 each by Other route 4 (four) times  daily. Use as instructed   acetaminophen (TYLENOL) 500 MG tablet Take 1 tablet (500 mg total) by mouth every 6 (six) hours as needed.   amLODipine (NORVASC) 5 MG tablet Take 5 mg by mouth daily.   ARIPiprazole (ABILIFY) 2 MG tablet Take 2 mg by mouth in the morning.   Ascorbic Acid (VITAMIN C) 1000 MG tablet Take 1,000 mg by mouth in the morning.   atenolol (TENORMIN) 50 MG tablet Take 1 tablet (50 mg total) by mouth 2 (two) times daily.   B-D ULTRAFINE III SHORT PEN 31G X 8 MM MISC USE 1 PEN NEEDLE 4 TIMES DAILY AS DIRECTED   betamethasone valerate ointment (VALISONE) 0.1 % Apply 1 application topically 2 (two) times daily. Apply to affected skin twice daily for 10-14 days. (Patient taking differently: Apply 1 application  topically 2 (two) times daily as needed (skin moles/blemishes.).)   Cholecalciferol (VITAMIN D3) 125 MCG (5000 UT) CAPS Take 5,000 Units by mouth every evening.   Coenzyme Q10 (COQ-10) 100 MG CAPS Take 100 mg by mouth at bedtime.   Cyanocobalamin (VITAMIN B-12) 5000 MCG TBDP Take 5,000 mcg by mouth in the morning.   cyclobenzaprine (FLEXERIL) 10 MG tablet Take 10 mg by mouth 3 (three) times daily as  needed.   Evening Primrose Oil 1000 MG CAPS Take 1,000 mg by mouth at bedtime.   ferrous sulfate 325 (65 FE) MG tablet Take 325 mg by mouth at bedtime.   FLUoxetine (PROZAC) 40 MG capsule Take 40 mg by mouth at bedtime.   glipiZIDE (GLUCOTROL XL) 5 MG 24 hr tablet Take 1 tablet (5 mg total) by mouth daily with breakfast.   glucose blood (ACCU-CHEK AVIVA PLUS) test strip TEST 4 TIMES DAILY DX : E11.65   Homeopathic Products (LIVER SUPPORT SL) Take 2 tablets by mouth in the morning.   ibuprofen (ADVIL) 200 MG tablet Take 600 mg by mouth every 8 (eight) hours as needed (for pain.).   Krill Oil 300 MG CAPS Take 300 mg by mouth every evening.   loratadine (CLARITIN) 10 MG tablet Take 10 mg by mouth every evening.   losartan (COZAAR) 50 MG tablet Take 50 mg by mouth every evening.    magnesium oxide (MAG-OX) 400 MG tablet Take 400 mg by mouth at bedtime.   methylphenidate (RITALIN) 20 MG tablet Take 20 mg by mouth 4 (four) times daily.   Milk Thistle 175 MG CAPS Take 175 mg by mouth in the morning.   Multiple Vitamin (MULTIVITAMIN WITH MINERALS) TABS tablet Take 1 tablet by mouth in the morning. Centrum   Multiple Vitamins-Minerals (ZINC PO) Take 1 tablet by mouth in the morning.   omeprazole (PRILOSEC) 20 MG capsule Take 20 mg by mouth every evening.   Probiotic Product (PROBIOTIC PO) Take 1 capsule by mouth in the morning.   rosuvastatin (CRESTOR) 20 MG tablet Take 20 mg by mouth in the morning.   Semaglutide, 1 MG/DOSE, 2 MG/1.5ML SOPN Inject 1 mg into the skin once a week.   Teriflunomide 14 MG TABS Take 14 mg by mouth at bedtime. ([place])   [DISCONTINUED] insulin lispro (HUMALOG KWIKPEN) 100 UNIT/ML KwikPen Inject 20-26 Units into the skin 3 (three) times daily with meals.   [DISCONTINUED] metFORMIN (GLUCOPHAGE-XR) 500 MG 24 hr tablet TAKE 2 TABLETS BY MOUTH ONCE DAILY WITH BREAKFAST   [DISCONTINUED] TOUJEO MAX SOLOSTAR 300 UNIT/ML Solostar Pen INJECT 110 UNITS SUBCUTANEOUSLY AT BEDTIME (Patient taking differently: 90 Units at bedtime.)   Continuous Blood Gluc Receiver ([place]) [place] See admin instructions.   doxycycline (VIBRA-TABS) 100 MG tablet Take 1 tablet (100 mg total) by mouth 2 (two) times daily. (Patient not taking: Reported on 10/01/2021)   hydrochlorothiazide (HYDRODIURIL) 12.5 MG tablet Take 12.5 mg by mouth in the morning. (Patient not taking: Reported on 10/01/2021)   insulin glargine, 2 Unit Dial, (TOUJEO MAX SOLOSTAR) 300 UNIT/ML Solostar Pen Inject 90 Units into the skin at bedtime.   insulin lispro (HUMALOG KWIKPEN) 100 UNIT/ML KwikPen Inject 22-28 Units into the skin 3 (three) times daily with meals.   metFORMIN (GLUCOPHAGE-XR) 500 MG 24 hr tablet TAKE 2 TABLETS BY MOUTH ONCE DAILY WITH BREAKFAST   No facility-administered encounter  medications on file as of 10/01/2021.   ALLERGIES: Allergies  Allergen Reactions   Lisinopril Cough    Other reaction(s): cough   Mango Flavor Rash    Mango fruit-rash   VACCINATION STATUS:  There is no immunization history on file for this patient.  Diabetes She presents for her follow-up diabetic visit. She has type 2 diabetes mellitus. Onset time: She was diagnosed at approximate age of 54 years. Her disease course has been improving. There are no hypoglycemic associated symptoms. Pertinent negatives for hypoglycemia include no confusion, headaches, pallor or seizures.  Associated symptoms include foot paresthesias and weight loss. Pertinent negatives for diabetes include no chest pain, no fatigue, no polydipsia, no polyphagia and no polyuria. There are no hypoglycemic complications. Symptoms are stable. Diabetic complications include nephropathy and peripheral neuropathy. Risk factors for coronary artery disease include dyslipidemia, hypertension, obesity, sedentary lifestyle and diabetes mellitus. Current diabetic treatment includes intensive insulin program and oral agent (monotherapy) (and Ozempic). She is compliant with treatment most of the time. Her weight is decreasing steadily. She is following a generally healthy diet. When asked about meal planning, she reported none. She has had a previous visit with a dietitian. She participates in exercise intermittently. Her home blood glucose trend is decreasing steadily. Her overall blood glucose range is 180-200 mg/dl. (She presents today with her CGM and logs showing slightly above target glycemic profile overall.  Her POCT A1c today is 7.3%, improving from last visit of 8%.  She notes she did lose her Glipizide between visits in her chair and had been about a month without it before she noticed she hadn't been taking it.  She also states she is tolerating the Ozempic 1 mg dose well, did have some nausea, but it is improving.  Analysis of her CGM  shows TIR 50%, TAR 49%, TBR <1% with a GMI of 7.7%.) An ACE inhibitor/angiotensin II receptor blocker is being taken. She does not see a podiatrist.Eye exam is current.  Hyperlipidemia This is a chronic problem. The current episode started more than 1 year ago. The problem is uncontrolled. Recent lipid tests were reviewed and are variable. Exacerbating diseases include chronic renal disease, diabetes and obesity. Factors aggravating her hyperlipidemia include fatty foods, beta blockers and thiazides. Pertinent negatives include no chest pain, myalgias or shortness of breath. Current antihyperlipidemic treatment includes statins. The current treatment provides mild improvement of lipids. Compliance problems include adherence to diet.  Risk factors for coronary artery disease include diabetes mellitus, dyslipidemia, a sedentary lifestyle, post-menopausal, obesity and hypertension.  Hypertension This is a chronic problem. The current episode started more than 1 year ago. The problem has been resolved since onset. The problem is controlled. Pertinent negatives include no chest pain, headaches, palpitations or shortness of breath. Agents associated with hypertension include amphetamines. Risk factors for coronary artery disease include diabetes mellitus, dyslipidemia, post-menopausal state, sedentary lifestyle and smoking/tobacco exposure. Past treatments include angiotensin blockers, beta blockers and diuretics. The current treatment provides mild improvement. Compliance problems include diet and exercise.  Hypertensive end-organ damage includes kidney disease. Identifiable causes of hypertension include chronic renal disease.     Review of systems  Constitutional: + steadily decreasing body weight,  current Body mass index is 35.45 kg/m. , no fatigue, no subjective hyperthermia, no subjective hypothermia Eyes: no blurry vision, no xerophthalmia ENT: no sore throat, no nodules palpated in throat, no  dysphagia/odynophagia, no hoarseness Cardiovascular: no chest pain, no shortness of breath, no palpitations, no leg swelling Respiratory: no cough, no shortness of breath Gastrointestinal: no nausea/vomiting/diarrhea Musculoskeletal: no muscle/joint aches, + muscle weakness (history of MS) Skin: no rashes, no hyperemia, Neurological: no tremors, + numbness/tingling to BLE (esp bottom of feet), no dizziness Psychiatric: no depression, no anxiety    Objective:    BP (!) 149/80 (BP Location: Left Arm, Patient Position: Sitting, Cuff Size: Normal)   Pulse 89   Ht '5\' 5"'$  (1.651 m)   Wt 213 lb (96.6 kg)   BMI 35.45 kg/m   Wt Readings from Last 3 Encounters:  10/01/21 213 lb (96.6 kg)  06/12/21 216 lb (98 kg)  05/21/21 217 lb (98.4 kg)    BP Readings from Last 3 Encounters:  10/01/21 (!) 149/80  06/12/21 132/81  05/19/21 (!) 143/91     Physical Exam- Limited  Constitutional:  Body mass index is 35.45 kg/m. , not in acute distress, normal state of mind Eyes:  EOMI, no exophthalmos Neck: Supple Cardiovascular: RRR, no murmurs, rubs, or gallops, no edema Respiratory: Adequate breathing efforts, no crackles, rales, rhonchi, or wheezing Musculoskeletal: no gross deformities, strength intact in all four extremities, no gross restriction of joint movements Skin:  no rashes, no hyperemia Neurological: no tremor with outstretched hands   Diabetic Foot Exam - Simple   Simple Foot Form Diabetic Foot exam was performed with the following findings: Yes 10/01/2021  3:21 PM  Visual Inspection No deformities, no ulcerations, no other skin breakdown bilaterally: Yes Sensation Testing Intact to touch and monofilament testing bilaterally: Yes Pulse Check Posterior Tibialis and Dorsalis pulse intact bilaterally: Yes Comments     Results for orders placed or performed in visit on 10/01/21  HgB A1c  Result Value Ref Range   Hemoglobin A1C 7.3 (A) 4.0 - 5.6 %   HbA1c POC (<> result,  manual entry)     HbA1c, POC (prediabetic range)     HbA1c, POC (controlled diabetic range)     Lipid Panel     Component Value Date/Time   CHOL 156 07/31/2020 0834   TRIG 193 (H) 07/31/2020 0834   HDL 33 (L) 07/31/2020 0834   CHOLHDL 4.7 (H) 07/31/2020 0834   CHOLHDL 4.2 06/17/2019 0818   VLDL 57 (H) 09/04/2016 1036   LDLCALC 90 07/31/2020 0834   LDLCALC 90 06/17/2019 0818   Recent Results (from the past 2160 hour(s))  HgB A1c     Status: Abnormal   Collection Time: 10/01/21  2:52 PM  Result Value Ref Range   Hemoglobin A1C 7.3 (A) 4.0 - 5.6 %   HbA1c POC (<> result, manual entry)     HbA1c, POC (prediabetic range)     HbA1c, POC (controlled diabetic range)        Assessment & Plan:   1) Uncontrolled type 2 diabetes mellitus with complication, with long-term current use of insulin ([place]becca)  She presents today with her CGM and logs showing slightly above target glycemic profile overall.  Her POCT A1c today is 7.3%, improving from last visit of 8%.  She notes she did lose her Glipizide between visits in her chair and had been about a month without it before she noticed she hadn't been taking it.  She also states she is tolerating the Ozempic 1 mg dose well, did have some nausea, but it is improving.  Analysis of her CGM shows TIR 50%, TAR 49%, TBR <1% with a GMI of 7.7%.   Recent labs reviewed showing elevated liver function tests (following up with PCP about this).  - Patient remains at a high risk for more acute and chronic complications of diabetes which include CAD, CVA, CKD, retinopathy, and neuropathy. These are all discussed in detail with the patient.  - Nutritional counseling repeated at each appointment due to patients tendency to fall back in to old habits.  - The patient admits there is a room for improvement in their diet and drink choices. -  Suggestion is made for the patient to avoid simple carbohydrates from their diet including Cakes, Sweet Desserts / Pastries,  Ice Cream, Soda (diet and regular), Sweet Tea, Candies, Chips, Cookies,  Sweet Pastries, Store Bought Juices, Alcohol in Excess of 1-2 drinks a day, Artificial Sweeteners, Coffee Creamer, and "Sugar-free" Products. This will help patient to have stable blood glucose profile and potentially avoid unintended weight gain.   - I encouraged the patient to switch to unprocessed or minimally processed complex starch and increased protein intake (animal or plant source), fruits, and vegetables.   - Patient is advised to stick to a routine mealtimes to eat 3 meals a day and avoid unnecessary snacks (to snack only to correct hypoglycemia).  - I have approached patient with the following individualized plan to manage diabetes and patient agrees.  -She is advised to continue her Toujeo 90 units SQ nightly, increase her Humalog to 22-28 units TID with meals if glucose is above 90 and she is eating (Specific instructions on how to titrate insulin dosage based on glucose readings given to patient in writing).  She can also continue her Glipizide 5 mg XL daily with breakfast and Ozempic 1 mg SQ weekly (she does not think she would tolerate the 2 mg dose due to nausea).    -She is encouraged to continue to monitor blood glucose at least 4 times a day (now using her CGM), before meals and at bedtime and report blood glucose readings less than 70 or greater than 200 for 3 tests in a row.    - Patient specific target  for A1c; LDL, HDL, Triglycerides, and  Waist Circumference were discussed in detail.  2) BP/HTN:  Her blood pressure is not controlled to target.  She is advised to continue Atenolol 50 mg p.o. twice daily, Norvasc 5 mg p.o. daily, and Losartan 50 mg p.o. daily.    3) Lipids/HPL: Her most recent lipid panel from 07/31/20 show controlled LDL at 90 and elevated triglycerides of 193.  She is advised to continue Crestor 20 mg po daily at bedtime.  Side effects and precautions discussed with her.    4)   Weight/Diet:  Her Body mass index is 35.45 kg/m. -clearly complicating her diabetes management.  She is a candidate for modest weight loss.  CDE consult in progress, exercise, and carbohydrates information provided.  5) Chronic Care/Health Maintenance: -Patient is on ACE and Statin medications and is encouraged to continue to follow up with Ophthalmology, Podiatrist at least yearly or according to recommendations, and advised to  stay away from smoking. I have recommended yearly flu vaccine and pneumonia vaccination at least every 5 years; moderate intensity exercise for up to 150 minutes weekly; and  sleep for at least 7 hours a day.  I advised patient to maintain close follow up with her PCP for primary care needs and regarding her skin lesion.      I spent 42 minutes in the care of the patient today including review of labs from [facility], Lipids, Thyroid Function, Hematology (current and previous including abstractions from other facilities); face-to-face time discussing  her blood glucose readings/logs, discussing hypoglycemia and hyperglycemia episodes and symptoms, medications doses, her options of short and long term treatment based on the latest standards of care / guidelines;  discussion about incorporating lifestyle medicine;  and documenting the encounter. Risk reduction counseling performed per USPSTF guidelines to reduce obesity and cardiovascular risk factors.     Please refer to Patient Instructions for Blood Glucose Monitoring and Insulin/Medications Dosing Guide"  in media tab for additional information. Please  also refer to " Patient Self Inventory" in the Media  tab for reviewed elements of pertinent patient  history.  Melanie Schultz participated in the discussions, expressed understanding, and voiced agreement with the above plans.  All questions were answered to her satisfaction. she is encouraged to contact clinic should she have any questions or concerns prior to her return  visit.     Follow up plan: Return in about 3 months (around 01/01/2022) for Diabetes F/U with A1c in office, No previsit labs, Bring meter and logs.   Melanie Schultz, [facility] [address] [place], [place] [address] Phone: [phone] Fax: [phone]  10/01/2021, 6:22 PM

## 2021-10-06 ENCOUNTER — Ambulatory Visit
Admission: EM | Admit: 2021-10-06 | Discharge: 2021-10-06 | Disposition: A | Discharge status: Home / Self Care | Payer: Medicare Other

## 2021-10-06 DIAGNOSIS — L0231 Cutaneous abscess of buttock: Secondary | ICD-10-CM | POA: Diagnosis not present

## 2021-10-06 MED ORDER — DOXYCYCLINE HYCLATE 100 MG PO TABS: 100.0000 mg | ORAL_TABLET | Freq: Two times a day (BID) | ORAL | 0 refills | Status: AC

## 2021-10-06 NOTE — Discharge Instructions (Addendum)
Take medication as prescribed. Warm compresses to the affected area 3-4 times daily. Clean the area at least twice daily with Dial Gold bar soap or Hibiclens soap while symptoms persist.   Keep the area covered while it is draining. Follow-up if you continue to experience pain, experience increased swelling, or the area begins to look like a pimple. Go to the emergency department if you develop fever, chills, generalized fatigue, nausea, vomiting, or if the area of redness spreads into the genital region, or if you have foul-smelling drainage.

## 2021-10-06 NOTE — ED Provider Notes (Signed)
[facility]    CSN: [id number] Arrival date & time: 10/06/21  0914      History   Chief Complaint Chief Complaint  Patient presents with   Abscess    HPI Melanie Schultz is a 66 y.o. female.   The history is provided by the patient.   Patient presents with a "abscess" to the left buttock that she noticed 1 day ago.  Patient states that she tried to "poke the area" with a safety pin and only got out blood.  She states the area has become more painful.  She denies fever, chills, chest pain, abdominal pain, nausea, vomiting, or diarrhea.  She states that she did have a history of recurrent abscesses in the past.  She does have a history of diabetes.  Most recent A1c was around 7.7.  Patient is requesting incision and drainage of the area.  Past Medical History:  Diagnosis Date   Anxiety    Depression    Diabetes mellitus without complication (HCC)    GERD (gastroesophageal reflux disease)    Hypertension    Liver fibrosis    Multiple sclerosis (HCC)    PONV (postoperative nausea and vomiting)    Sleep apnea     Patient Active Problem List   Diagnosis Date Noted   Other seborrheic keratosis 03/05/2021   Dizziness and giddiness 12/26/2020   Amnesia 01/31/2020   Anogenital warts 01/31/2020   Attention deficit hyperactivity disorder 01/31/2020   Body mass index (BMI) 33.0-33.9, adult 01/31/2020   Carbuncle 01/31/2020   Costochondritis 01/31/2020   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   Derangement of posterior horn of medial meniscus 01/31/2020   Diabetic peripheral neuropathy associated with type 2 diabetes mellitus ([place]) 01/31/2020   Fatty liver 01/31/2020   Gastroesophageal reflux disease 01/31/2020   Long term (current) use of insulin ([place]) 01/31/2020   Low back pain 01/31/2020   Mood disorder ([place]) 01/31/2020   Multiple sclerosis ([place]) 01/31/2020   Neurogenic bladder 01/31/2020   Orthopnea 01/31/2020   Other abnormal and inconclusive findings on  diagnostic imaging of breast 01/31/2020   Papillomavirus as the cause of diseases classified elsewhere 01/31/2020   Sciatica 01/31/2020   Solitary pulmonary nodule 01/31/2020   Trigger finger, left middle finger 01/31/2020   Vitamin D deficiency 01/31/2020   Sleep apnea 11/25/2019   Fatigue 09/06/2019   Impaired cognition 04/07/2019   Abnormal liver function tests 08/28/2018   Type 2 diabetes mellitus ([place]) 11/24/2014   Benign essential hypertension 11/24/2014   Hyperlipidemia 11/24/2014    Past Surgical History:  Procedure Laterality Date   BREAST BIOPSY Left    BREAST EXCISIONAL BIOPSY Left    benign   CESAREAN SECTION     ENDOMETRIAL ABLATION     NASAL SEPTUM SURGERY     TRIGGER FINGER RELEASE Left 05/04/2021   Procedure: RELEASE TRIGGER FINGER/A-1 PULLEY;  Surgeon: Carole Civil, MD;  Location: [facility];  Service: Orthopedics;  Laterality: Left;    OB History   No obstetric history on file.      Home Medications    Prior to Admission medications   Medication Sig Start Date End Date Taking? Authorizing Provider  doxycycline (VIBRA-TABS) 100 MG tablet Take 1 tablet (100 mg total) by mouth 2 (two) times daily for 7 days. 10/06/21 10/13/21 Yes Darien Mignogna-Warren, Alda Lea, NP  Accu-Chek Softclix Lancets lancets 100 each by Other route 4 (four) times daily. Use as instructed 09/17/18   Cassandria Anger, MD  acetaminophen (TYLENOL) 500  MG tablet Take 1 tablet (500 mg total) by mouth every 6 (six) hours as needed. 05/04/21 05/04/22  Carole Civil, MD  amLODipine (NORVASC) 5 MG tablet Take 5 mg by mouth daily. 08/01/21   [provider]  amLODipine (NORVASC) 5 MG tablet Take 1 tablet by mouth once daily for 90    [provider]  ARIPiprazole (ABILIFY) 2 MG tablet Take 2 mg by mouth in the morning. 09/15/19   [provider]  Ascorbic Acid (VITAMIN C) 1000 MG tablet Take 1,000 mg by mouth in the morning.    [provider]  Ascorbic Acid  (VITAMIN C) 1000 MG tablet 1 tablet Orally Once a day    [provider]  atenolol (TENORMIN) 50 MG tablet Take 1 tablet (50 mg total) by mouth 2 (two) times daily. 09/18/18   Cassandria Anger, MD  B-D ULTRAFINE III SHORT PEN 31G X 8 MM MISC USE 1 PEN NEEDLE 4 TIMES DAILY AS DIRECTED 07/18/21   Brita Romp, NP  betamethasone valerate ointment (VALISONE) 0.1 % Apply 1 application topically 2 (two) times daily. Apply to affected skin twice daily for 10-14 days. Patient taking differently: Apply 1 application  topically 2 (two) times daily as needed (skin moles/blemishes.). 08/20/20   Scot Jun, FNP  Cholecalciferol (VITAMIN D3) 125 MCG (5000 UT) CAPS Take 5,000 Units by mouth every evening.    [provider]  Cholecalciferol 125 MCG (5000 UT) capsule 1 capsule Orally once a day    [provider]  Coenzyme Q10 (COQ-10) 100 MG CAPS Take 100 mg by mouth at bedtime.    [provider]  Continuous Blood Gluc Receiver ([place]) [place] See admin instructions.    [provider]  Cyanocobalamin (VITAMIN B-12) 5000 MCG TBDP Take 5,000 mcg by mouth in the morning.    [provider]  cyclobenzaprine (FLEXERIL) 10 MG tablet Take 10 mg by mouth 3 (three) times daily as needed. 07/04/21   [provider]  Evening Primrose Oil 1000 MG CAPS Take 1,000 mg by mouth at bedtime.    [provider]  fenofibrate 54 MG tablet Take 54 mg by mouth daily. 07/27/21   [provider]  ferrous sulfate 325 (65 FE) MG tablet Take 325 mg by mouth at bedtime.    [provider]  FLUoxetine (PROZAC) 40 MG capsule Take 40 mg by mouth at bedtime. 09/15/19   [provider]  FLUoxetine (PROZAC) 40 MG capsule 1 capsule Orally Once a day    [provider]  glipiZIDE (GLUCOTROL XL) 5 MG 24 hr tablet Take 1 tablet (5 mg total) by mouth daily with breakfast. 03/13/21   Brita Romp, NP  glucose blood  (ACCU-CHEK AVIVA PLUS) test strip TEST 4 TIMES DAILY DX : E11.65 11/21/20   Brita Romp, NP  Homeopathic Products (LIVER SUPPORT SL) Take 2 tablets by mouth in the morning.    [provider]  hydrochlorothiazide (HYDRODIURIL) 12.5 MG tablet Take 12.5 mg by mouth in the morning. Patient not taking: Reported on 10/01/2021    [provider]  insulin glargine, 2 Unit Dial, (TOUJEO MAX SOLOSTAR) 300 UNIT/ML Solostar Pen Inject 90 Units into the skin at bedtime. 10/01/21   Brita Romp, NP  insulin lispro (HUMALOG KWIKPEN) 100 UNIT/ML KwikPen Inject 22-28 Units into the skin 3 (three) times daily with meals. 10/01/21   Brita Romp, NP  Krill Oil 300 MG CAPS Take  300 mg by mouth every evening.    [provider]  loratadine (CLARITIN) 10 MG tablet Take 10 mg by mouth every evening.    [provider]  losartan (COZAAR) 50 MG tablet Take 50 mg by mouth every evening. 06/18/19   [provider]  magnesium oxide (MAG-OX) 400 MG tablet Take 400 mg by mouth at bedtime.    [provider]  metFORMIN (GLUCOPHAGE-XR) 500 MG 24 hr tablet TAKE 2 TABLETS BY MOUTH ONCE DAILY WITH BREAKFAST 10/01/21   Brita Romp, NP  methylphenidate (RITALIN) 20 MG tablet Take 20 mg by mouth 4 (four) times daily. 03/29/21   [provider]  Milk Thistle 175 MG CAPS Take 175 mg by mouth in the morning.    [provider]  Multiple Vitamin (MULTIVITAMIN WITH MINERALS) TABS tablet Take 1 tablet by mouth in the morning. Centrum    [provider]  Multiple Vitamins-Minerals (ZINC PO) Take 1 tablet by mouth in the morning.    [provider]  omeprazole (PRILOSEC) 20 MG capsule Take 20 mg by mouth every evening. 06/13/20   [provider]  Probiotic Product (PROBIOTIC PO) Take 1 capsule by mouth in the morning.    [provider]  rosuvastatin (CRESTOR) 20 MG tablet Take 20 mg by mouth in the morning. 05/07/18    [provider]  Semaglutide, 1 MG/DOSE, 2 MG/1.5ML SOPN Inject 1 mg into the skin once a week. 07/12/21   Brita Romp, NP  Teriflunomide 14 MG TABS Take 14 mg by mouth at bedtime. Rogelia Rohrer)    [provider]    Family History Family History  Problem Relation Age of Onset   Hypertension Mother    Cancer Mother     Social History Social History   Tobacco Use   Smoking status: Former    Types: Cigarettes    Quit date: 02/04/1978    Years since quitting: 43.6   Smokeless tobacco: Never  Vaping Use   Vaping Use: Never used  Substance Use Topics   Alcohol use: No    Alcohol/week: 0.0 standard drinks of alcohol   Drug use: No     Allergies   Lisinopril and Mango flavor   Review of Systems Review of Systems Per HPI  Physical Exam Triage Vital Signs ED Triage Vitals  Enc Vitals Group     BP 10/06/21 0947 120/78     Pulse Rate 10/06/21 0947 88     Resp 10/06/21 0947 18     Temp 10/06/21 0947 98.8 F (37.1 C)     Temp Source 10/06/21 0947 Oral     SpO2 10/06/21 0947 96 %     Weight --      Height --      Head Circumference --      Peak Flow --      Pain Score 10/06/21 0953 7     Pain Loc --      Pain Edu? --      Excl. in [place]? --    No data found.  Updated Vital Signs BP 120/78 (BP Location: Right Arm)   Pulse 88   Temp 98.8 F (37.1 C) (Oral)   Resp 18   SpO2 96%   Visual Acuity Right Eye Distance:   Left Eye Distance:   Bilateral Distance:    Right Eye Near:   Left Eye Near:    Bilateral Near:     Physical Exam Vitals and nursing  note reviewed.  Constitutional:      General: She is not in acute distress.    Appearance: She is well-developed.  Cardiovascular:     Rate and Rhythm: Normal rate and regular rhythm.     Heart sounds: Normal heart sounds.  Pulmonary:     Effort: Pulmonary effort is normal.     Breath sounds: Normal breath sounds.  Abdominal:     General: Bowel sounds are normal. There is no distension.      Palpations: Abdomen is soft.     Tenderness: There is no abdominal tenderness. There is no guarding or rebound.  Genitourinary:    Vagina: Normal. No vaginal discharge.  Skin:    General: Skin is warm and dry.     Findings: Abscess present. No erythema or rash.     Comments: Induration to the lower left buttock that measures approximately 1.5 cm.  Firmness is noted to the induration.  Area with dried blood for scabbing.  There is no oozing, fluctuance, or drainage present.  Neurological:     Mental Status: She is alert and oriented to person, place, and time.     Cranial Nerves: No cranial nerve deficit.  Psychiatric:        Behavior: Behavior normal.      UC Treatments / Results  Labs (all labs ordered are listed, but only abnormal results are displayed) Labs Reviewed - No data to display  EKG   Radiology No results found.  Procedures Procedures (including critical care time)  Medications Ordered in UC Medications - No data to display  Initial Impression / Assessment and Plan / UC Course  I have reviewed the triage vital signs and the nursing notes.  Pertinent labs & imaging results that were available during my care of the patient were reviewed by me and considered in my medical decision making (see chart for details).  Patient presents for an abscess to the left buttock.  On exam, patient has a firm area of induration to the left buttock that measures approximately 1.5 cm in diameter.  Area is firm, but there is no fluctuance, oozing, or drainage present.  Patient requesting I&D, however, this is not indicated based on the current presentation.  The same was explained to the patient.  Symptoms are consistent with a skin abscess.  Will start patient on doxycycline at this time.  Supportive care recommendations were provided to the patient.  Patient advised to follow-up as needed. Final Clinical Impressions(s) / UC Diagnoses   Final diagnoses:  Abscess of left buttock      Discharge Instructions      Take medication as prescribed. Warm compresses to the affected area 3-4 times daily. Clean the area at least twice daily with Dial Gold bar soap or Hibiclens soap while symptoms persist.   Keep the area covered while it is draining. Follow-up if you continue to experience pain, experience increased swelling, or the area begins to look like a pimple. Go to the emergency department if you develop fever, chills, generalized fatigue, nausea, vomiting, or if the area of redness spreads into the genital region, or if you have foul-smelling drainage.      ED Prescriptions     Medication Sig Dispense Auth. Provider   doxycycline (VIBRA-TABS) 100 MG tablet Take 1 tablet (100 mg total) by mouth 2 (two) times daily for 7 days. 14 tablet Sandy Haye-Warren, Alda Lea, NP      PDMP not reviewed this encounter.   Rechelle Niebla-Warren, Alda Lea,  NP 10/06/21 1023

## 2021-10-06 NOTE — ED Triage Notes (Signed)
Pt reports painful abscess near anus x 1 day. Pt reports she was trying to drain the abscess and blood came out. Pt is worry as she has to be sitting for an extended period of time tomorrow for a convention.

## 2021-10-09 ENCOUNTER — Other Ambulatory Visit: Payer: Self-pay | Admitting: Nurse Practitioner

## 2021-10-18 ENCOUNTER — Other Ambulatory Visit: Payer: Self-pay | Admitting: Family Medicine

## 2021-11-03 ENCOUNTER — Other Ambulatory Visit: Payer: Self-pay | Admitting: Family Medicine

## 2021-11-05 NOTE — Telephone Encounter (Signed)
Requested Prescriptions  Pending Prescriptions Disp Refills  . betamethasone valerate ointment (VALISONE) 0.1 % [Pharmacy Med Name: Betamethasone Valerate 0.1 % External Ointment] 15 g 0    Sig: APPLY  OINTMENT TO AFFECTED AREA TWICE DAILY FOR 10-14 DAYS     Off-Protocol Failed - 11/03/2021 11:02 AM      Failed - Medication not assigned to a protocol, review manually.      Failed - Valid encounter within last 12 months    Recent Outpatient Visits   None

## 2021-11-13 ENCOUNTER — Other Ambulatory Visit: Payer: Self-pay | Admitting: Family Medicine

## 2021-12-05 LAB — HEPATIC FUNCTION PANEL
ALT: 48 U/L — AB (ref 7–35)
AST: 44 — AB (ref 13–35)
Alkaline Phosphatase: 56 (ref 25–125)
Bilirubin, Total: 0.2

## 2021-12-05 LAB — BASIC METABOLIC PANEL
BUN: 10 (ref 4–21)
CO2: 25 — AB (ref 13–22)
Chloride: 101 (ref 99–108)
Creatinine: 0.7 (ref 0.5–1.1)
Glucose: 121
Potassium: 4.7 mEq/L (ref 3.5–5.1)
Sodium: 136 — AB (ref 137–147)

## 2021-12-05 LAB — COMPREHENSIVE METABOLIC PANEL
Albumin: 4.5 (ref 3.5–5.0)
Calcium: 9.8 (ref 8.7–10.7)
Globulin: 2.8
eGFR: 94

## 2021-12-05 LAB — PROTEIN / CREATININE RATIO, URINE
Albumin, U: 0.6
Creatinine, Urine: 44

## 2021-12-05 LAB — HEMOGLOBIN A1C: Hemoglobin A1C: 7.6

## 2021-12-05 LAB — VITAMIN D 25 HYDROXY (VIT D DEFICIENCY, FRACTURES): Vit D, 25-Hydroxy: 61

## 2021-12-05 LAB — MICROALBUMIN / CREATININE URINE RATIO: Microalb Creat Ratio: 14

## 2021-12-05 LAB — TSH: TSH: 2.47 (ref 0.41–5.90)

## 2021-12-06 LAB — LIPID PANEL
HDL: 39 (ref 35–70)
LDL Cholesterol: 80

## 2021-12-12 ENCOUNTER — Other Ambulatory Visit: Payer: Self-pay | Admitting: *Deleted

## 2021-12-13 ENCOUNTER — Encounter: Payer: Self-pay | Admitting: *Deleted

## 2021-12-18 ENCOUNTER — Other Ambulatory Visit: Payer: Self-pay | Admitting: Nurse Practitioner

## 2021-12-25 ENCOUNTER — Other Ambulatory Visit: Payer: Self-pay | Admitting: Nurse Practitioner

## 2022-01-03 ENCOUNTER — Ambulatory Visit: Payer: Medicare Other | Admitting: Nurse Practitioner

## 2022-01-03 ENCOUNTER — Encounter: Payer: Self-pay | Admitting: Nurse Practitioner

## 2022-01-03 VITALS — BP 123/75 | HR 85 | Ht 65.0 in | Wt 216.2 lb

## 2022-01-03 DIAGNOSIS — Z794 Long term (current) use of insulin: Secondary | ICD-10-CM | POA: Diagnosis not present

## 2022-01-03 DIAGNOSIS — E559 Vitamin D deficiency, unspecified: Secondary | ICD-10-CM | POA: Diagnosis not present

## 2022-01-03 DIAGNOSIS — E118 Type 2 diabetes mellitus with unspecified complications: Secondary | ICD-10-CM

## 2022-01-03 DIAGNOSIS — E782 Mixed hyperlipidemia: Secondary | ICD-10-CM | POA: Diagnosis not present

## 2022-01-03 DIAGNOSIS — E1169 Type 2 diabetes mellitus with other specified complication: Secondary | ICD-10-CM

## 2022-01-03 DIAGNOSIS — I10 Essential (primary) hypertension: Secondary | ICD-10-CM | POA: Diagnosis not present

## 2022-01-03 MED ORDER — SEMAGLUTIDE (2 MG/DOSE) 8 MG/3ML ~~LOC~~ SOPN: 2.0000 mg | PEN_INJECTOR | SUBCUTANEOUS | 3 refills | Status: DC

## 2022-01-03 MED ORDER — TOUJEO MAX SOLOSTAR 300 UNIT/ML ~~LOC~~ SOPN: 80.0000 [IU] | PEN_INJECTOR | Freq: Every evening | SUBCUTANEOUS | 3 refills | Status: DC

## 2022-01-03 NOTE — Progress Notes (Signed)
01/03/2022   Endocrinology follow-up note    Subjective:    Patient ID: Melanie Schultz, female    DOB: [birth year],  Melanie Schultz presents today for follow up of uncontrolled type 2 diabetes, hypertension, and hyperlipidemia.  Past Medical History:  Diagnosis Date   Anxiety    Depression    Diabetes mellitus without complication (HCC)    GERD (gastroesophageal reflux disease)    Hypertension    Liver fibrosis    Multiple sclerosis (HCC)    PONV (postoperative nausea and vomiting)    Sleep apnea    Past Surgical History:  Procedure Laterality Date   BREAST BIOPSY Left    BREAST EXCISIONAL BIOPSY Left    benign   CESAREAN SECTION     ENDOMETRIAL ABLATION     NASAL SEPTUM SURGERY     TRIGGER FINGER RELEASE Left 05/04/2021   Procedure: RELEASE TRIGGER FINGER/A-1 PULLEY;  Surgeon: Carole Civil, MD;  Location: [facility];  Service: Orthopedics;  Laterality: Left;   Social History   Socioeconomic History   Marital status: Divorced    Spouse name: Not on file   Number of children: Not on file   Years of education: Not on file   Highest education level: Not on file  Occupational History   Not on file  Tobacco Use   Smoking status: Former    Types: Cigarettes    Quit date: 02/04/1978    Years since quitting: 43.9   Smokeless tobacco: Never  Vaping Use   Vaping Use: Never used  Substance and Sexual Activity   Alcohol use: No    Alcohol/week: 0.0 standard drinks of alcohol   Drug use: No   Sexual activity: Not on file  Other Topics Concern   Not on file  Social History Narrative   Not on file   Social Determinants of Health   Financial Resource Strain: Not on file  Food Insecurity: Not on file  Transportation Needs: Not on file  Physical Activity: Not on file  Stress: Not on file  Social Connections: Not on file   Outpatient Encounter Medications as of 01/03/2022  Medication Sig   Accu-Chek Softclix Lancets lancets 100 each by Other route 4 (four) times  daily. Use as instructed   acetaminophen (TYLENOL) 500 MG tablet Take 1 tablet (500 mg total) by mouth every 6 (six) hours as needed.   ARIPiprazole (ABILIFY) 2 MG tablet Take 2 mg by mouth in the morning.   Ascorbic Acid (VITAMIN C) 1000 MG tablet Take 1,000 mg by mouth in the morning.   atenolol (TENORMIN) 50 MG tablet Take 1 tablet (50 mg total) by mouth 2 (two) times daily.   B-D ULTRAFINE III SHORT PEN 31G X 8 MM MISC USE 1 PEN NEEDLE 4 TIMES DAILY AS DIRECTED   Cholecalciferol (VITAMIN D3) 125 MCG (5000 UT) CAPS Take 5,000 Units by mouth every evening.   Cholecalciferol 125 MCG (5000 UT) capsule 1 capsule Orally once a day   Coenzyme Q10 (COQ-10) 100 MG CAPS Take 100 mg by mouth at bedtime.   Continuous Blood Gluc Receiver ([place]) [place] See admin instructions.   Cyanocobalamin (VITAMIN B-12) 5000 MCG TBDP Take 5,000 mcg by mouth in the morning.   Evening Primrose Oil 1000 MG CAPS Take 1,000 mg by mouth at bedtime.   fenofibrate 54 MG tablet Take 54 mg by mouth daily.   ferrous sulfate 325 (65 FE) MG tablet Take 325 mg by mouth at bedtime.  FLUoxetine (PROZAC) 40 MG capsule 1 capsule Orally Once a day   glucose blood (ACCU-CHEK AVIVA PLUS) test strip TEST 4 TIMES DAILY E11.65   Homeopathic Products (LIVER SUPPORT SL) Take 2 tablets by mouth in the morning.   insulin lispro (HUMALOG KWIKPEN) 100 UNIT/ML KwikPen Inject 22-28 Units into the skin 3 (three) times daily with meals.   Krill Oil 300 MG CAPS Take 300 mg by mouth every evening.   loratadine (CLARITIN) 10 MG tablet Take 10 mg by mouth every evening.   losartan (COZAAR) 50 MG tablet Take 50 mg by mouth every evening.   magnesium oxide (MAG-OX) 400 MG tablet Take 400 mg by mouth at bedtime.   metFORMIN (GLUCOPHAGE-XR) 500 MG 24 hr tablet TAKE 2 TABLETS BY MOUTH ONCE DAILY WITH BREAKFAST   methylphenidate (RITALIN) 20 MG tablet Take 20 mg by mouth 4 (four) times daily.   Milk Thistle 175 MG CAPS Take 175 mg by mouth in  the morning.   Multiple Vitamin (MULTIVITAMIN WITH MINERALS) TABS tablet Take 1 tablet by mouth in the morning. Centrum   Multiple Vitamins-Minerals (ZINC PO) Take 1 tablet by mouth in the morning.   omeprazole (PRILOSEC) 20 MG capsule Take 20 mg by mouth every evening.   Probiotic Product (PROBIOTIC PO) Take 1 capsule by mouth in the morning.   rosuvastatin (CRESTOR) 20 MG tablet Take 20 mg by mouth in the morning.   Semaglutide, 2 MG/DOSE, 8 MG/3ML SOPN Inject 2 mg as directed once a week.   [DISCONTINUED] Ascorbic Acid (VITAMIN C) 1000 MG tablet 1 tablet Orally Once a day   [DISCONTINUED] FLUoxetine (PROZAC) 40 MG capsule Take 40 mg by mouth at bedtime.   [DISCONTINUED] glipiZIDE (GLUCOTROL XL) 5 MG 24 hr tablet Take 1 tablet by mouth once daily with breakfast   [DISCONTINUED] insulin glargine, 2 Unit Dial, (TOUJEO MAX SOLOSTAR) 300 UNIT/ML Solostar Pen Inject 90 Units into the skin at bedtime.   [DISCONTINUED] Semaglutide, 1 MG/DOSE, 2 MG/1.5ML SOPN Inject 1 mg into the skin once a week.   insulin glargine, 2 Unit Dial, (TOUJEO MAX SOLOSTAR) 300 UNIT/ML Solostar Pen Inject 80 Units into the skin at bedtime.   [DISCONTINUED] amLODipine (NORVASC) 5 MG tablet Take 5 mg by mouth daily. (Patient not taking: Reported on 01/03/2022)   [DISCONTINUED] amLODipine (NORVASC) 5 MG tablet Take 1 tablet by mouth once daily for 90 (Patient not taking: Reported on 01/03/2022)   [DISCONTINUED] betamethasone valerate ointment (VALISONE) 0.1 % Apply 1 application topically 2 (two) times daily. Apply to affected skin twice daily for 10-14 days. (Patient not taking: Reported on 01/03/2022)   [DISCONTINUED] cyclobenzaprine (FLEXERIL) 10 MG tablet Take 10 mg by mouth 3 (three) times daily as needed. (Patient not taking: Reported on 01/03/2022)   [DISCONTINUED] hydrochlorothiazide (HYDRODIURIL) 12.5 MG tablet Take 12.5 mg by mouth in the morning. (Patient not taking: Reported on 10/01/2021)   [DISCONTINUED]  Teriflunomide 14 MG TABS Take 14 mg by mouth at bedtime. ([place]) (Patient not taking: Reported on 01/03/2022)   No facility-administered encounter medications on file as of 01/03/2022.   ALLERGIES: Allergies  Allergen Reactions   Lisinopril Cough    Other reaction(s): cough   Mango Flavor Rash    Mango fruit-rash   VACCINATION STATUS:  There is no immunization history on file for this patient.  Diabetes She presents for her follow-up diabetic visit. She has type 2 diabetes mellitus. Onset time: She was diagnosed at approximate age of 60 years. Her disease course has  been improving. There are no hypoglycemic associated symptoms. Pertinent negatives for hypoglycemia include no confusion, headaches, pallor or seizures. Associated symptoms include foot paresthesias. Pertinent negatives for diabetes include no chest pain, no fatigue, no polydipsia, no polyphagia, no polyuria and no weight loss. There are no hypoglycemic complications. Symptoms are stable. Diabetic complications include nephropathy and peripheral neuropathy. Risk factors for coronary artery disease include dyslipidemia, hypertension, obesity, sedentary lifestyle and diabetes mellitus. Current diabetic treatment includes intensive insulin program and oral agent (monotherapy) (and Ozempic). She is compliant with treatment most of the time. Her weight is fluctuating minimally. She is following a generally healthy diet. When asked about meal planning, she reported none. She has had a previous visit with a dietitian. She participates in exercise intermittently. Her home blood glucose trend is decreasing steadily. Her overall blood glucose range is 140-180 mg/dl. (She presents today with her CGM showing improving glycemic profile overall.  Her previsit A1c on 12/05/21 was 7.6%, increasing slightly from last visit of 7.3%.  She noticed she has been snacking more at night to prevent hypoglycemia.  Analysis of her CGM shows TIR 53%, TAR 46%, TBR  <2% with a GMI of 7.6%.  ) An ACE inhibitor/angiotensin II receptor blocker is being taken. She does not see a podiatrist.Eye exam is current.  Hyperlipidemia This is a chronic problem. The current episode started more than 1 year ago. The problem is uncontrolled. Recent lipid tests were reviewed and are variable. Exacerbating diseases include chronic renal disease, diabetes and obesity. Factors aggravating her hyperlipidemia include fatty foods, beta blockers and thiazides. Pertinent negatives include no chest pain, myalgias or shortness of breath. Current antihyperlipidemic treatment includes statins. The current treatment provides mild improvement of lipids. Compliance problems include adherence to diet.  Risk factors for coronary artery disease include diabetes mellitus, dyslipidemia, a sedentary lifestyle, post-menopausal, obesity and hypertension.  Hypertension This is a chronic problem. The current episode started more than 1 year ago. The problem has been resolved since onset. The problem is controlled. Pertinent negatives include no chest pain, headaches, palpitations or shortness of breath. Agents associated with hypertension include amphetamines. Risk factors for coronary artery disease include diabetes mellitus, dyslipidemia, post-menopausal state, sedentary lifestyle and smoking/tobacco exposure. Past treatments include angiotensin blockers, beta blockers and diuretics. The current treatment provides mild improvement. Compliance problems include diet and exercise.  Hypertensive end-organ damage includes kidney disease. Identifiable causes of hypertension include chronic renal disease.     Review of systems  Constitutional: + minimally fluctuating body weight,  current Body mass index is 35.98 kg/m. , no fatigue, no subjective hyperthermia, no subjective hypothermia Eyes: no blurry vision, no xerophthalmia ENT: no sore throat, no nodules palpated in throat, no dysphagia/odynophagia, no  hoarseness Cardiovascular: no chest pain, no shortness of breath, no palpitations, no leg swelling Respiratory: no cough, no shortness of breath Gastrointestinal: no nausea/vomiting/diarrhea Musculoskeletal: no muscle/joint aches, + muscle weakness (history of MS) Skin: no rashes, no hyperemia, + hair loss from MS medication Neurological: no tremors, + numbness/tingling to BLE (esp bottom of feet), no dizziness Psychiatric: no depression, no anxiety    Objective:    BP 123/75 (BP Location: Left Arm, Patient Position: Sitting, Cuff Size: Large)   Pulse 85   Ht _0  (1.651 m)   Wt 216 lb 3.2 oz (98.1 kg)   BMI 35.98 kg/m   Wt Readings from Last 3 Encounters:  01/03/22 216 lb 3.2 oz (98.1 kg)  10/01/21 213 lb (96.6 kg)  06/12/21 216 lb (  98 kg)    BP Readings from Last 3 Encounters:  01/03/22 123/75  10/06/21 120/78  10/01/21 (!) 149/80     Physical Exam- Limited  Constitutional:  Body mass index is 35.98 kg/m. , not in acute distress, normal state of mind Eyes:  EOMI, no exophthalmos Musculoskeletal: no gross deformities, strength intact in all four extremities, no gross restriction of joint movements Skin:  no rashes, no hyperemia Neurological: no tremor with outstretched hands   Diabetic Foot Exam - Simple   No data filed     Results for orders placed or performed in visit on 12/13/21  Microalbumin, urine  Result Value Ref Range   Microalb, Ur 0.6   CBC and differential  Result Value Ref Range   Hemoglobin 12.8 12.0 - 16.0   HCT 39 36 - 46   Platelets 286 150 - 400 K/uL   WBC 12.0   Microalbumin / creatinine urine ratio  Result Value Ref Range   Microalb Creat Ratio 14   Protein / creatinine ratio, urine  Result Value Ref Range   Creatinine, Urine 44   Basic metabolic panel  Result Value Ref Range   Glucose 210    BUN 9 4 - 21   Creatinine 0.8 0.5 - 1.1   Chloride 96 (A) 99 - 108  Comprehensive metabolic panel  Result Value Ref Range   eGFR 87     Calcium 10.1 8.7 - 10.7  Lipid panel  Result Value Ref Range   LDl/HDL Ratio 77.0    Triglycerides 244 (A) 40 - 160   Cholesterol 143 0 - 200   HDL 39 35 - 70   LDL Cholesterol 80   TSH  Result Value Ref Range   TSH 2.74 0.41 - 5.90   Lipid Panel     Component Value Date/Time   CHOL 143 07/25/2021 1646   CHOL 156 07/31/2020 0834   TRIG 244 (A) 07/25/2021 1646   HDL 39 12/06/2021 1435   HDL 33 (L) 07/31/2020 0834   CHOLHDL 4.7 (H) 07/31/2020 0834   CHOLHDL 4.2 06/17/2019 0818   VLDL 57 (H) 09/04/2016 1036   LDLCALC 80 12/06/2021 1435   LDLCALC 90 07/31/2020 0834   LDLCALC 90 06/17/2019 0818   Recent Results (from the past 2160 hour(s))  VITAMIN D 25 Hydroxy (Vit-D Deficiency, Fractures)     Status: None   Collection Time: 12/05/21  2:35 PM  Result Value Ref Range   Vit D, 25-Hydroxy 61   Microalbumin / creatinine urine ratio     Status: None   Collection Time: 12/05/21  2:35 PM  Result Value Ref Range   Microalb Creat Ratio 14   Protein / creatinine ratio, urine     Status: None   Collection Time: 12/05/21  2:35 PM  Result Value Ref Range   Creatinine, Urine 44    Albumin, U 0.6   Basic metabolic panel     Status: Abnormal   Collection Time: 12/05/21  2:35 PM  Result Value Ref Range   Glucose 121    BUN 10 4 - 21   CO2 25 (A) 13 - 22   Creatinine 0.7 0.5 - 1.1   Potassium 4.7 3.5 - 5.1 mEq/L   Sodium 136 (A) 137 - 147   Chloride 101 99 - 108  Comprehensive metabolic panel     Status: None   Collection Time: 12/05/21  2:35 PM  Result Value Ref Range   Globulin 2.8  eGFR 94    Calcium 9.8 8.7 - 10.7   Albumin 4.5 3.5 - 5.0  Hepatic function panel     Status: Abnormal   Collection Time: 12/05/21  2:35 PM  Result Value Ref Range   Alkaline Phosphatase 56 25 - 125   ALT 48 (A) 7 - 35 U/L   AST 44 (A) 13 - 35   Bilirubin, Total 0.2   Hemoglobin A1c     Status: None   Collection Time: 12/05/21  2:35 PM  Result Value Ref Range   Hemoglobin A1C 7.6   TSH      Status: None   Collection Time: 12/05/21  2:35 PM  Result Value Ref Range   TSH 2.47 0.41 - 5.90  Lipid panel     Status: None   Collection Time: 12/06/21  2:35 PM  Result Value Ref Range   HDL 39 35 - 70   LDL Cholesterol 80       Assessment & Plan:   1) Uncontrolled type 2 diabetes mellitus with complication, with long-term current use of insulin ([place])  She presents today with her CGM showing improving glycemic profile overall.  Her previsit A1c on 12/05/21 was 7.6%, increasing slightly from last visit of 7.3%.  She noticed she has been snacking more at night to prevent hypoglycemia.  Analysis of her CGM shows TIR 53%, TAR 46%, TBR <2% with a GMI of 7.6%.     Recent labs reviewed showing elevated liver function tests (following up with PCP about this).  - Patient remains at a high risk for more acute and chronic complications of diabetes which include CAD, CVA, CKD, retinopathy, and neuropathy. These are all discussed in detail with the patient.  - Nutritional counseling repeated at each appointment due to patients tendency to fall back in to old habits.  - The patient admits there is a room for improvement in their diet and drink choices. -  Suggestion is made for the patient to avoid simple carbohydrates from their diet including Cakes, Sweet Desserts / Pastries, Ice Cream, Soda (diet and regular), Sweet Tea, Candies, Chips, Cookies, Sweet Pastries, Store Bought Juices, Alcohol in Excess of 1-2 drinks a day, Artificial Sweeteners, Coffee Creamer, and "Sugar-free" Products. This will help patient to have stable blood glucose profile and potentially avoid unintended weight gain.   - I encouraged the patient to switch to unprocessed or minimally processed complex starch and increased protein intake (animal or plant source), fruits, and vegetables.   - Patient is advised to stick to a routine mealtimes to eat 3 meals a day and avoid unnecessary snacks (to snack only to correct  hypoglycemia).  - I have approached patient with the following individualized plan to manage diabetes and patient agrees.  -She is advised to lower her Toujeo to 80 units SQ nightly, continue her Humalog 22-28 units TID with meals if glucose is above 90 and she is eating (Specific instructions on how to titrate insulin dosage based on glucose readings given to patient in writing), and Metformin 1000 mg ER daily with breakfast. She is advised to stop her Glipizide today and increase her Ozempic to 2 mg SQ weekly (previously she could not tolerate higher dose but she would like to try again).  She can take 2 of her 1 mg injections on the same day until she runs out of her current supply.  We did discuss strategies to minimize nausea today.    -She is encouraged to continue to monitor  blood glucose at least 4 times a day (now using her CGM), before meals and at bedtime and report blood glucose readings less than 70 or greater than 200 for 3 tests in a row.    - Patient specific target  for A1c; LDL, HDL, Triglycerides, and  Waist Circumference were discussed in detail.  2) BP/HTN:  Her blood pressure is not controlled to target.  She is advised to continue Atenolol 50 mg p.o. twice daily and Losartan 50 mg p.o. daily.    3) Lipids/HPL: Her most recent lipid panel from 12/06/21 show controlled LDL at 80.  She is advised to continue Crestor 20 mg po daily at bedtime.  Side effects and precautions discussed with her.    4)  Weight/Diet:  Her Body mass index is 35.98 kg/m. -clearly complicating her diabetes management.  She is a candidate for modest weight loss.  CDE consult in progress, exercise, and carbohydrates information provided.  5) Chronic Care/Health Maintenance: -Patient is on ACE and Statin medications and is encouraged to continue to follow up with Ophthalmology, Podiatrist at least yearly or according to recommendations, and advised to  stay away from smoking. I have recommended yearly flu  vaccine and pneumonia vaccination at least every 5 years; moderate intensity exercise for up to 150 minutes weekly; and  sleep for at least 7 hours a day.  I advised patient to maintain close follow up with her PCP for primary care needs and regarding her skin lesion.      I spent 46 minutes in the care of the patient today including review of labs from [place], Lipids, Thyroid Function, Hematology (current and previous including abstractions from other facilities); face-to-face time discussing  her blood glucose readings/logs, discussing hypoglycemia and hyperglycemia episodes and symptoms, medications doses, her options of short and long term treatment based on the latest standards of care / guidelines;  discussion about incorporating lifestyle medicine;  and documenting the encounter. Risk reduction counseling performed per USPSTF guidelines to reduce obesity and cardiovascular risk factors.     Please refer to Patient Instructions for Blood Glucose Monitoring and Insulin/Medications Dosing Guide"  in media tab for additional information. Please  also refer to " Patient Self Inventory" in the Media  tab for reviewed elements of pertinent patient history.  Royetta Crochet Indelicato participated in the discussions, expressed understanding, and voiced agreement with the above plans.  All questions were answered to her satisfaction. she is encouraged to contact clinic should she have any questions or concerns prior to her return visit.     Follow up plan: Return in about 3 months (around 04/04/2022) for Diabetes F/U with A1c in office, No previsit labs, Bring meter and logs.   Rayetta Pigg, [facility] [address] [place], [place] [address] Phone: [phone] Fax: [phone]  01/03/2022, 3:24 PM

## 2022-01-15 ENCOUNTER — Ambulatory Visit: Payer: Medicare Other | Admitting: Dermatology

## 2022-02-06 ENCOUNTER — Other Ambulatory Visit: Payer: Self-pay | Admitting: Nurse Practitioner

## 2022-02-19 ENCOUNTER — Encounter: Payer: Self-pay | Admitting: Dermatology

## 2022-02-19 ENCOUNTER — Ambulatory Visit (INDEPENDENT_AMBULATORY_CARE_PROVIDER_SITE_OTHER): Payer: Medicare Other | Admitting: Dermatology

## 2022-02-19 VITALS — BP 145/84 | HR 93

## 2022-02-19 DIAGNOSIS — L821 Other seborrheic keratosis: Secondary | ICD-10-CM

## 2022-02-19 DIAGNOSIS — L570 Actinic keratosis: Secondary | ICD-10-CM | POA: Diagnosis not present

## 2022-02-19 DIAGNOSIS — L565 Disseminated superficial actinic porokeratosis (DSAP): Secondary | ICD-10-CM

## 2022-02-19 DIAGNOSIS — L814 Other melanin hyperpigmentation: Secondary | ICD-10-CM

## 2022-02-19 DIAGNOSIS — D229 Melanocytic nevi, unspecified: Secondary | ICD-10-CM | POA: Diagnosis not present

## 2022-02-19 DIAGNOSIS — Z1283 Encounter for screening for malignant neoplasm of skin: Secondary | ICD-10-CM

## 2022-02-19 DIAGNOSIS — L650 Telogen effluvium: Secondary | ICD-10-CM | POA: Diagnosis not present

## 2022-02-19 DIAGNOSIS — L918 Other hypertrophic disorders of the skin: Secondary | ICD-10-CM

## 2022-02-19 DIAGNOSIS — L820 Inflamed seborrheic keratosis: Secondary | ICD-10-CM | POA: Diagnosis not present

## 2022-02-19 MED ORDER — BETAMETHASONE VALERATE 0.1 % EX OINT: TOPICAL_OINTMENT | CUTANEOUS | 1 refills | Status: DC

## 2022-02-19 NOTE — Progress Notes (Signed)
Follow-Up Visit   Subjective  Melanie Schultz is a 67 y.o. female who presents for the following: Annual Exam (No personal hx of skin cancer or dysplastic nevi) and Alopecia (Contacted Hers, was sent topical Minoxidil to use as directed. Also taking several supplements for hair loss).  She has multiple growths on her neck, inframammary, face, and chest that get irritated.  Wants Rx for bethamethasone valerate to help with itching which she has used in the past.  The patient presents for Total-Body Skin Exam (TBSE) for skin cancer screening and mole check.  The patient has spots, moles and lesions to be evaluated, some may be new or changing and the patient has concerns that these could be cancer.   The following portions of the chart were reviewed this encounter and updated as appropriate:      Review of Systems: No other skin or systemic complaints except as noted in HPI or Assessment and Plan.   Objective  Well appearing patient in no apparent distress; mood and affect are within normal limits.  A full examination was performed including scalp, head, eyes, ears, nose, lips, neck, chest, axillae, abdomen, back, buttocks, bilateral upper extremities, bilateral lower extremities, hands, feet, fingers, toes, fingernails, and toenails. All findings within normal limits unless otherwise noted below.  Nose x1 Erythematous thin papules/macules with gritty scale.   Neck x12,  Right temple x1, sternum x1, inframammary Lx6 Rx3 (23) Erythematous keratotic or waxy stuck-on papule or plaque.  B/L legs,arms Pink scaly macules with keratotic rims at arms and legs   central upper abdomen 54m brown macule  Scalp Hair thinning on crown Mild/Moderate amount of new hair growth at frontal and temporal scalp   Assessment & Plan   Lentigines - Scattered tan macules - Due to sun exposure - Benign-appearing, observe - Recommend daily broad spectrum sunscreen SPF 30+ to sun-exposed areas, reapply  every 2 hours as needed. - Call for any changes  Seborrheic Keratoses - Stuck-on, waxy, tan-brown papules and/or plaques  - Benign-appearing - Discussed benign etiology and prognosis. - Observe - Call for any changes  Melanocytic Nevi - Tan-brown and/or pink-flesh-colored symmetric macules and papules - Benign appearing on exam today - Observation - Call clinic for new or changing moles - Recommend daily use of broad spectrum spf 30+ sunscreen to sun-exposed areas.   Hemangiomas - Red papules - Discussed benign nature - Observe - Call for any changes  Actinic Damage - Chronic condition, secondary to cumulative UV/sun exposure - diffuse scaly erythematous macules with underlying dyspigmentation - Recommend daily broad spectrum sunscreen SPF 30+ to sun-exposed areas, reapply every 2 hours as needed.  - Staying in the shade or wearing long sleeves, sun glasses (UVA+UVB protection) and wide brim hats (4-inch brim around the entire circumference of the hat) are also recommended for sun protection.  - Call for new or changing lesions.  Skin cancer screening performed today.  Acrochordons (Skin Tags) - Fleshy, skin-colored pedunculated papules - Benign appearing.  - Observe. - If desired, they can be removed with an in office procedure that is not covered by insurance. - Please call the clinic if you notice any new or changing lesions.   AK (actinic keratosis) Nose x1  Actinic keratoses are precancerous spots that appear secondary to cumulative UV radiation exposure/sun exposure over time. They are chronic with expected duration over 1 year. A portion of actinic keratoses will progress to squamous cell carcinoma of the skin. It is not possible to reliably  predict which spots will progress to skin cancer and so treatment is recommended to prevent development of skin cancer.  Recommend daily broad spectrum sunscreen SPF 30+ to sun-exposed areas, reapply every 2 hours as needed.   Recommend staying in the shade or wearing long sleeves, sun glasses (UVA+UVB protection) and wide brim hats (4-inch brim around the entire circumference of the hat). Call for new or changing lesions.  Biopsy if not resolved  Destruction of lesion - Nose x1  Destruction method: cryotherapy   Informed consent: discussed and consent obtained   Lesion destroyed using liquid nitrogen: Yes   Region frozen until ice ball extended beyond lesion: Yes   Outcome: patient tolerated procedure well with no complications   Post-procedure details: wound care instructions given   Additional details:  Prior to procedure, discussed risks of blister formation, small wound, skin dyspigmentation, or rare scar following cryotherapy. Recommend Vaseline ointment to treated areas while healing.   Inflamed seborrheic keratosis (23) Neck x12,  Right temple x1, sternum x1, inframammary Lx6 Rx3  Symptomatic, irritating, patient would like treated.  Okay to spot treat areas with Betamethasone Valerate ointment qd/bid as needed only. Topical steroids (such as triamcinolone, fluocinolone, fluocinonide, mometasone, clobetasol, halobetasol, betamethasone, hydrocortisone) can cause thinning and lightening of the skin if they are used for too long in the same area. Your physician has selected the right strength medicine for your problem and area affected on the body. Please use your medication only as directed by your physician to prevent side effects.    Destruction of lesion - Neck x12,  Right temple x1, sternum x1, inframammary Lx6 Rx3  Destruction method: cryotherapy   Informed consent: discussed and consent obtained   Lesion destroyed using liquid nitrogen: Yes   Region frozen until ice ball extended beyond lesion: Yes   Outcome: patient tolerated procedure well with no complications   Post-procedure details: wound care instructions given   Additional details:  Prior to procedure, discussed risks of blister  formation, small wound, skin dyspigmentation, or rare scar following cryotherapy. Recommend Vaseline ointment to treated areas while healing.   betamethasone valerate ointment (VALISONE) 0.1 % - Neck x12,  Right temple x1, sternum x1, inframammary Lx6 Rx3 Apply twice daily to affected areas as needed for itching. Avoid applying to face, groin, and axilla.  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 Condition is symptomatic/ bothersome to patient. Not currently at goal.  DSAP is a chronic inherited condition of sun-exposed skin, most commonly affecting the arms and legs.  It is difficult to treat.  Recommend photoprotection and regular use of spf 30 or higher sunscreen to prevent worsening of condition and precancerous changes.  Continue Cholesterol 2% Lovastatin 2% Cream 240 gm- Apply twice daily as directed to affected areas arms and legs.  Patient states she does not need refills. Does not like the way the topical treatment feels.   Nevus central upper abdomen  Benign-appearing.  Observation.  Call clinic for new or changing lesions.  Recommend daily use of broad spectrum spf 30+ sunscreen to sun-exposed areas.    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  Also started topical minoxidil around same time.  Continue topical minoxidil as directed.  Telogen Effluvium Counseling Telogen effluvium is a benign,  self-limited condition causing increased hair shedding usually for several months. It does not progress to baldness, and the hair eventually grows back on its own. It can be triggered by recent illness, recent surgery, thyroid disease, low iron stores, vitamin D deficiency, fad diets or  rapid weight loss, hormonal changes such as pregnancy or birth control pills, and some medication. Usually the hair loss starts 2-3 months after the illness or health change. Rarely, it can continue for longer than a year. Treatments options may include oral or topical Minoxidil; Red Light scalp treatments; Biotin 2.5 mg daily and other options.      Return in about 1 year (around 02/20/2023) for TBSE.  I, Emelia Salisbury, CMA, am acting as scribe for Brendolyn Patty, MD.  Documentation: I have reviewed the above documentation for accuracy and completeness, and I agree with the above.  Brendolyn Patty MD

## 2022-02-19 NOTE — Patient Instructions (Addendum)
Cryotherapy Aftercare  Wash gently with soap and water everyday.   Apply Vaseline daily until healed.   Hair thinning: Telogen Effluvium Counseling Telogen effluvium is a benign, self-limited condition causing increased hair shedding usually for several months. It does not progress to baldness, and the hair eventually grows back on its own. It can be triggered by recent illness, recent surgery, thyroid disease, low iron stores, vitamin D deficiency, fad diets or rapid weight loss, hormonal changes such as pregnancy or birth control pills, and some medication. Usually the hair loss starts 2-3 months after the illness or health change. Rarely, it can continue for longer than a year. Treatments options may include oral or topical Minoxidil; Red Light scalp treatments; Biotin 2.5 mg daily and other options.   Recommend daily broad spectrum sunscreen SPF 30+ to sun-exposed areas, reapply every 2 hours as needed. Call for new or changing lesions.  Staying in the shade or wearing long sleeves, sun glasses (UVA+UVB protection) and wide brim hats (4-inch brim around the entire circumference of the hat) are also recommended for sun protection.    Melanoma ABCDEs  Melanoma is the most dangerous type of skin cancer, and is the leading cause of death from skin disease.  You are more likely to develop melanoma if you: Have light-colored skin, light-colored eyes, or red or blond hair Spend a lot of time in the sun Tan regularly, either outdoors or in a tanning bed Have had blistering sunburns, especially during childhood Have a close family member who has had a melanoma Have atypical moles or large birthmarks  Early detection of melanoma is key since treatment is typically straightforward and cure rates are extremely high if we catch it early.   The first sign of melanoma is often a change in a mole or a new dark spot.  The ABCDE system is a way of remembering the signs of melanoma.  A for asymmetry:   The two halves do not match. B for border:  The edges of the growth are irregular. C for color:  A mixture of colors are present instead of an even brown color. D for diameter:  Melanomas are usually (but not always) greater than 11m - the size of a pencil eraser. E for evolution:  The spot keeps changing in size, shape, and color.  Please check your skin once per month between visits. You can use a small mirror in front and a large mirror behind you to keep an eye on the back side or your body.   If you see any new or changing lesions before your next follow-up, please call to schedule a visit.  Please continue daily skin protection including broad spectrum sunscreen SPF 30+ to sun-exposed areas, reapplying every 2 hours as needed when you're outdoors.   Staying in the shade or wearing long sleeves, sun glasses (UVA+UVB protection) and wide brim hats (4-inch brim around the entire circumference of the hat) are also recommended for sun protection.    Due to recent changes in healthcare laws, you may see results of your pathology and/or laboratory studies on MyChart before the doctors have had a chance to review them. We understand that in some cases there may be results that are confusing or concerning to you. Please understand that not all results are received at the same time and often the doctors may need to interpret multiple results in order to provide you with the best plan of care or course of treatment. Therefore, we ask that  you please give [place] 2 business days to thoroughly review all your results before contacting the office for clarification. Should we see a critical lab result, you will be contacted sooner.   If You Need Anything After Your Visit  If you have any questions or concerns for your doctor, please call our main line at [phone] and press option 4 to reach your doctor's medical assistant. If no one answers, please leave a voicemail as directed and we will return your call  as soon as possible. Messages left after 4 pm will be answered the following business day.   You may also send [place] a message via [place]. We typically respond to MyChart messages within 1-2 business days.  For prescription refills, please ask your pharmacy to contact our office. Our fax number is [phone].  If you have an urgent issue when the clinic is closed that cannot wait until the next business day, you can page your doctor at the number below.    Please note that while we do our best to be available for urgent issues outside of office hours, we are not available 24/7.   If you have an urgent issue and are unable to reach [place], you may choose to seek medical care at your doctor's office, retail clinic, urgent care center, or emergency room.  If you have a medical emergency, please immediately call 911 or go to the emergency department.  Pager Numbers  - Dr. Nehemiah Massed: [phone]  - Dr. Laurence Ferrari: [phone]  - Dr. Nicole Kindred: [phone]  In the event of inclement weather, please call our main line at [phone] for an update on the status of any delays or closures.  Dermatology Medication Tips: Please keep the boxes that topical medications come in in order to help keep track of the instructions about where and how to use these. Pharmacies typically print the medication instructions only on the boxes and not directly on the medication tubes.   If your medication is too expensive, please contact our office at [phone] option 4 or send [place] a message through [place].   We are unable to tell what your co-pay for medications will be in advance as this is different depending on your insurance coverage. However, we may be able to find a substitute medication at lower cost or fill out paperwork to get insurance to cover a needed medication.   If a prior authorization is required to get your medication covered by your insurance company, please allow [place] 1-2 business days to complete  this process.  Drug prices often vary depending on where the prescription is filled and some pharmacies may offer cheaper prices.  The website www.goodrx.com contains coupons for medications through different pharmacies. The prices here do not account for what the cost may be with help from insurance (it may be cheaper with your insurance), but the website can give you the price if you did not use any insurance.  - You can print the associated coupon and take it with your prescription to the pharmacy.  - You may also stop by our office during regular business hours and pick up a GoodRx coupon card.  - If you need your prescription sent electronically to a different pharmacy, notify our office through [facility] or by phone at [phone] option 4.     Si Usted Necesita Algo Despus de Su Visita  Tambin puede enviarnos un mensaje a travs de Pharmacist, community. Por lo general respondemos a los mensajes de Therapist, occupational transcurso  de 1 a 2 das hbiles.  Para renovar recetas, por favor pida a su farmacia que se ponga en contacto con nuestra oficina. Harland Dingwall de fax es [place] [phone].  Si tiene un asunto urgente cuando la clnica est cerrada y que no puede esperar hasta el siguiente da hbil, puede llamar/localizar a su doctor(a) al nmero que aparece a continuacin.   Por favor, tenga en cuenta que aunque hacemos todo lo posible para estar disponibles para asuntos urgentes fuera del horario de [place], no estamos disponibles las 24 horas del da, los 7 das de la [place].   Si tiene un problema urgente y no puede comunicarse con nosotros, puede optar por buscar atencin mdica  en el consultorio de su doctor(a), en una clnica privada, en un centro de atencin urgente o en una sala de emergencias.  Si tiene Engineering geologist, por favor llame inmediatamente al 911 o vaya a la sala de emergencias.  Nmeros de bper  - Dr. Nehemiah Massed: [phone]  - Dra. Moye: [phone]  -  Dra. Nicole Kindred: [phone]  En caso de inclemencias del [place], por favor llame a Johnsie Kindred principal al [phone] para una actualizacin sobre el [place] de cualquier retraso o cierre.  Consejos para la medicacin en dermatologa: Por favor, guarde las cajas en las que vienen los medicamentos de uso tpico para ayudarle a seguir las instrucciones sobre dnde y cmo usarlos. Las farmacias generalmente imprimen las instrucciones del medicamento slo en las cajas y no directamente en los tubos del [place].   Si su medicamento es muy caro, por favor, pngase en contacto con Zigmund Daniel llamando al [phone] y presione la opcin 4 o envenos un mensaje a travs de Pharmacist, community.   No podemos decirle cul ser su copago por los medicamentos por adelantado ya que esto es diferente dependiendo de la cobertura de su seguro. Sin embargo, es posible que podamos encontrar un medicamento sustituto a Electrical engineer un formulario para que el seguro cubra el medicamento que se considera necesario.   Si se requiere una autorizacin previa para que su compaa de seguros [place] su medicamento, por favor permtanos de 1 a 2 das hbiles para completar este proceso.  Los precios de los medicamentos varan con frecuencia dependiendo del Environmental consultant de dnde se surte la receta y alguna farmacias pueden ofrecer precios ms baratos.  El sitio web www.goodrx.com tiene cupones para medicamentos de Airline pilot. Los precios aqu no tienen en cuenta lo que podra costar con la ayuda del seguro (puede ser ms barato con su seguro), pero el sitio web puede darle el precio si no utiliz Research scientist (physical sciences).  - Puede imprimir el cupn correspondiente y llevarlo con su receta a la farmacia.  - Tambin puede pasar por nuestra oficina durante el horario de atencin regular y Charity fundraiser una tarjeta de cupones de GoodRx.  - Si necesita que su receta se enve electrnicamente a una farmacia diferente, informe a nuestra  oficina a travs de MyChart de [place] o por telfono llamando al [phone] y presione la opcin 4.

## 2022-03-26 ENCOUNTER — Other Ambulatory Visit: Payer: Self-pay | Admitting: Nurse Practitioner

## 2022-04-04 ENCOUNTER — Encounter: Payer: Self-pay | Admitting: Radiology

## 2022-04-08 ENCOUNTER — Ambulatory Visit: Payer: Medicare Other | Admitting: Nurse Practitioner

## 2022-04-08 ENCOUNTER — Encounter: Payer: Self-pay | Admitting: Nurse Practitioner

## 2022-04-08 VITALS — BP 126/88 | HR 85 | Ht 65.0 in | Wt 211.0 lb

## 2022-04-08 DIAGNOSIS — I10 Essential (primary) hypertension: Secondary | ICD-10-CM | POA: Diagnosis not present

## 2022-04-08 DIAGNOSIS — Z794 Long term (current) use of insulin: Secondary | ICD-10-CM

## 2022-04-08 DIAGNOSIS — E1169 Type 2 diabetes mellitus with other specified complication: Secondary | ICD-10-CM | POA: Diagnosis not present

## 2022-04-08 DIAGNOSIS — E559 Vitamin D deficiency, unspecified: Secondary | ICD-10-CM

## 2022-04-08 DIAGNOSIS — E782 Mixed hyperlipidemia: Secondary | ICD-10-CM | POA: Diagnosis not present

## 2022-04-08 LAB — POCT GLYCOSYLATED HEMOGLOBIN (HGB A1C): Hemoglobin A1C: 7.1 % — AB (ref 4.0–5.6)

## 2022-04-08 MED ORDER — SEMAGLUTIDE (1 MG/DOSE) 4 MG/3ML ~~LOC~~ SOPN: 1.0000 mg | PEN_INJECTOR | SUBCUTANEOUS | 1 refills | Status: DC

## 2022-04-08 NOTE — Progress Notes (Signed)
04/08/2022   Endocrinology follow-up note    Subjective:    Patient ID: Melanie Schultz, female    DOB: [birth year],  Melanie Schultz presents today for follow up of uncontrolled type 2 diabetes, hypertension, and hyperlipidemia.  Past Medical History:  Diagnosis Date   Anxiety    Depression    Diabetes mellitus without complication (HCC)    GERD (gastroesophageal reflux disease)    Hypertension    Liver fibrosis    Multiple sclerosis (HCC)    PONV (postoperative nausea and vomiting)    Sleep apnea    Past Surgical History:  Procedure Laterality Date   BREAST BIOPSY Left    BREAST EXCISIONAL BIOPSY Left    benign   CESAREAN SECTION     ENDOMETRIAL ABLATION     NASAL SEPTUM SURGERY     TRIGGER FINGER RELEASE Left 05/04/2021   Procedure: RELEASE TRIGGER FINGER/A-1 PULLEY;  Surgeon: Carole Civil, MD;  Location: [facility];  Service: Orthopedics;  Laterality: Left;   Social History   Socioeconomic History   Marital status: Divorced    Spouse name: Not on file   Number of children: Not on file   Years of education: Not on file   Highest education level: Not on file  Occupational History   Not on file  Tobacco Use   Smoking status: Former    Types: Cigarettes    Quit date: 02/04/1978    Years since quitting: 44.2   Smokeless tobacco: Never  Vaping Use   Vaping Use: Never used  Substance and Sexual Activity   Alcohol use: No    Alcohol/week: 0.0 standard drinks of alcohol   Drug use: No   Sexual activity: Not on file  Other Topics Concern   Not on file  Social History Narrative   Not on file   Social Determinants of Health   Financial Resource Strain: Not on file  Food Insecurity: Not on file  Transportation Needs: Not on file  Physical Activity: Not on file  Stress: Not on file  Social Connections: Not on file   Outpatient Encounter Medications as of 04/08/2022  Medication Sig   Accu-Chek Softclix Lancets lancets 100 each by Other route 4 (four) times  daily. Use as instructed   acetaminophen (TYLENOL) 500 MG tablet Take 1 tablet (500 mg total) by mouth every 6 (six) hours as needed.   ARIPiprazole (ABILIFY) 2 MG tablet Take 2 mg by mouth in the morning.   Ascorbic Acid (VITAMIN C) 1000 MG tablet Take 1,000 mg by mouth in the morning.   atenolol (TENORMIN) 50 MG tablet Take 1 tablet (50 mg total) by mouth 2 (two) times daily.   B-D ULTRAFINE III SHORT PEN 31G X 8 MM MISC USE PEN NEEDLE 4 TIMES DAILY AS  DIRECTED   B-D ULTRAFINE III SHORT PEN 31G X 8 MM MISC USE 1 PEN NEEDLE 4 TIMES DAILY AS DIRECTED   betamethasone valerate ointment (VALISONE) 0.1 % Apply twice daily to affected areas as needed for itching. Avoid applying to face, groin, and axilla.   Cholecalciferol (VITAMIN D3) 125 MCG (5000 UT) CAPS Take 5,000 Units by mouth every evening.   Cholecalciferol 125 MCG (5000 UT) capsule 1 capsule Orally once a day   Coenzyme Q10 (COQ-10) 100 MG CAPS Take 100 mg by mouth at bedtime.   Continuous Blood Gluc Receiver ([place]) [place] See admin instructions.   Cyanocobalamin (VITAMIN B-12) 5000 MCG TBDP Take 5,000 mcg by mouth in  the morning.   Evening Primrose Oil 1000 MG CAPS Take 1,000 mg by mouth at bedtime.   fenofibrate 54 MG tablet Take 54 mg by mouth daily.   ferrous sulfate 325 (65 FE) MG tablet Take 325 mg by mouth at bedtime.   FLUoxetine (PROZAC) 40 MG capsule 1 capsule Orally Once a day   glucose blood (ACCU-CHEK AVIVA PLUS) test strip TEST 4 TIMES DAILY E11.65   Homeopathic Products (LIVER SUPPORT SL) Take 2 tablets by mouth in the morning.   insulin glargine, 2 Unit Dial, (TOUJEO MAX SOLOSTAR) 300 UNIT/ML Solostar Pen Inject 80 Units into the skin at bedtime.   insulin lispro (HUMALOG KWIKPEN) 100 UNIT/ML KwikPen Inject 22-28 Units into the skin 3 (three) times daily with meals.   Krill Oil 300 MG CAPS Take 300 mg by mouth every evening.   loratadine (CLARITIN) 10 MG tablet Take 10 mg by mouth every evening.   losartan  (COZAAR) 50 MG tablet Take 50 mg by mouth every evening.   magnesium oxide (MAG-OX) 400 MG tablet Take 400 mg by mouth at bedtime.   metFORMIN (GLUCOPHAGE-XR) 500 MG 24 hr tablet TAKE 2 TABLETS BY MOUTH ONCE DAILY WITH BREAKFAST   methylphenidate (RITALIN) 20 MG tablet Take 20 mg by mouth 4 (four) times daily.   Milk Thistle 175 MG CAPS Take 175 mg by mouth in the morning.   Multiple Vitamin (MULTIVITAMIN WITH MINERALS) TABS tablet Take 1 tablet by mouth in the morning. Centrum   Multiple Vitamins-Minerals (ZINC PO) Take 1 tablet by mouth in the morning.   omeprazole (PRILOSEC) 20 MG capsule Take 20 mg by mouth every evening.   Probiotic Product (PROBIOTIC PO) Take 1 capsule by mouth in the morning.   rosuvastatin (CRESTOR) 20 MG tablet Take 20 mg by mouth in the morning.   Semaglutide, 1 MG/DOSE, 4 MG/3ML SOPN Inject 1 mg as directed once a week.   [DISCONTINUED] Semaglutide, 2 MG/DOSE, 8 MG/3ML SOPN Inject 2 mg as directed once a week.   No facility-administered encounter medications on file as of 04/08/2022.   ALLERGIES: Allergies  Allergen Reactions   Lisinopril Cough    Other reaction(s): cough   Mango Flavor Rash    Mango fruit-rash   VACCINATION STATUS:  There is no immunization history on file for this patient.  Diabetes She presents for her follow-up diabetic visit. She has type 2 diabetes mellitus. Onset time: She was diagnosed at approximate age of 27 years. Her disease course has been improving. There are no hypoglycemic associated symptoms. Pertinent negatives for hypoglycemia include no confusion, headaches, pallor or seizures. Associated symptoms include foot paresthesias and weight loss. Pertinent negatives for diabetes include no chest pain, no fatigue, no polydipsia, no polyphagia and no polyuria. There are no hypoglycemic complications. Symptoms are stable. Diabetic complications include nephropathy and peripheral neuropathy. Risk factors for coronary artery disease  include dyslipidemia, hypertension, obesity, sedentary lifestyle and diabetes mellitus. Current diabetic treatment includes intensive insulin program and oral agent (monotherapy) (and Ozempic). She is compliant with treatment most of the time. Her weight is decreasing steadily. She is following a generally healthy diet. When asked about meal planning, she reported none. She has had a previous visit with a dietitian. She participates in exercise intermittently. Her home blood glucose trend is decreasing steadily. Her overall blood glucose range is 140-180 mg/dl. (She presents today with her CGM showing improved glycemic profile overall.  Her POCT A1c today is 7.1%, improving from last visit of 7.6%.  Analysis of her CGM shows TIR 63%, TAR 36%, TBR <2% with a GMI of 7.4%.  She notes significant nausea and constipation since increasing her Ozempic.  She does note that she has not been doing well with her diet recently either.) An ACE inhibitor/angiotensin II receptor blocker is being taken. She does not see a podiatrist.Eye exam is current.  Hyperlipidemia This is a chronic problem. The current episode started more than 1 year ago. The problem is uncontrolled. Recent lipid tests were reviewed and are variable. Exacerbating diseases include chronic renal disease, diabetes and obesity. Factors aggravating her hyperlipidemia include fatty foods, beta blockers and thiazides. Pertinent negatives include no chest pain, myalgias or shortness of breath. Current antihyperlipidemic treatment includes statins. The current treatment provides mild improvement of lipids. Compliance problems include adherence to diet.  Risk factors for coronary artery disease include diabetes mellitus, dyslipidemia, a sedentary lifestyle, post-menopausal, obesity and hypertension.  Hypertension This is a chronic problem. The current episode started more than 1 year ago. The problem has been resolved since onset. The problem is controlled.  Pertinent negatives include no chest pain, headaches, palpitations or shortness of breath. Agents associated with hypertension include amphetamines. Risk factors for coronary artery disease include diabetes mellitus, dyslipidemia, post-menopausal state, sedentary lifestyle and smoking/tobacco exposure. Past treatments include angiotensin blockers, beta blockers and diuretics. The current treatment provides mild improvement. Compliance problems include diet and exercise.  Hypertensive end-organ damage includes kidney disease. Identifiable causes of hypertension include chronic renal disease.     Review of systems  Constitutional: + steadily decreasing body weight,  current Body mass index is 35.11 kg/m. , no fatigue, no subjective hyperthermia, no subjective hypothermia Eyes: no blurry vision, no xerophthalmia ENT: no sore throat, no nodules palpated in throat, no dysphagia/odynophagia, no hoarseness Cardiovascular: no chest pain, no shortness of breath, no palpitations, no leg swelling Respiratory: no cough, no shortness of breath Gastrointestinal: no nausea/vomiting/diarrhea Musculoskeletal: no muscle/joint aches, + muscle weakness (history of MS) Skin: no rashes, no hyperemia, + hair loss from MS medication Neurological: no tremors, + numbness/tingling to BLE (esp bottom of feet), no dizziness Psychiatric: no depression, no anxiety    Objective:    BP 126/88 (BP Location: Right Arm, Patient Position: Sitting, Cuff Size: Large)   Pulse 85   Ht '5\' 5"'$  (1.651 m)   Wt 211 lb (95.7 kg)   BMI 35.11 kg/m   Wt Readings from Last 3 Encounters:  04/08/22 211 lb (95.7 kg)  01/03/22 216 lb 3.2 oz (98.1 kg)  10/01/21 213 lb (96.6 kg)    BP Readings from Last 3 Encounters:  04/08/22 126/88  02/19/22 (!) 145/84  01/03/22 123/75     Physical Exam- Limited  Constitutional:  Body mass index is 35.11 kg/m. , not in acute distress, normal state of mind Eyes:  EOMI, no  exophthalmos Musculoskeletal: no gross deformities, strength intact in all four extremities, no gross restriction of joint movements Skin:  no rashes, no hyperemia Neurological: no tremor with outstretched hands   Diabetic Foot Exam - Simple   No data filed     Results for orders placed or performed in visit on 04/08/22  HgB A1c  Result Value Ref Range   Hemoglobin A1C 7.1 (A) 4.0 - 5.6 %   HbA1c POC (<> result, manual entry)     HbA1c, POC (prediabetic range)     HbA1c, POC (controlled diabetic range)     Lipid Panel     Component Value Date/Time  CHOL 143 07/25/2021 1646   CHOL 156 07/31/2020 0834   TRIG 244 (A) 07/25/2021 1646   HDL 39 12/06/2021 1435   HDL 33 (L) 07/31/2020 0834   CHOLHDL 4.7 (H) 07/31/2020 0834   CHOLHDL 4.2 06/17/2019 0818   VLDL 57 (H) 09/04/2016 1036   LDLCALC 80 12/06/2021 1435   LDLCALC 90 07/31/2020 0834   LDLCALC 90 06/17/2019 0818   Recent Results (from the past 2160 hour(s))  HgB A1c     Status: Abnormal   Collection Time: 04/08/22  2:36 PM  Result Value Ref Range   Hemoglobin A1C 7.1 (A) 4.0 - 5.6 %   HbA1c POC (<> result, manual entry)     HbA1c, POC (prediabetic range)     HbA1c, POC (controlled diabetic range)         Assessment & Plan:   1) Controlled type 2 diabetes mellitus without complication, with long-term current use of insulin ([place])   She presents today with her CGM showing improved glycemic profile overall.  Her POCT A1c today is 7.1%, improving from last visit of 7.6%.  Analysis of her CGM shows TIR 63%, TAR 36%, TBR <2% with a GMI of 7.4%.  She notes significant nausea and constipation since increasing her Ozempic.  She does note that she has not been doing well with her diet recently either.   Recent labs reviewed showing elevated liver function tests (following up with PCP about this).  - Patient remains at a high risk for more acute and chronic complications of diabetes which include CAD, CVA, CKD, retinopathy,  and neuropathy. These are all discussed in detail with the patient.  - Nutritional counseling repeated at each appointment due to patients tendency to fall back in to old habits.  - The patient admits there is a room for improvement in their diet and drink choices. -  Suggestion is made for the patient to avoid simple carbohydrates from their diet including Cakes, Sweet Desserts / Pastries, Ice Cream, Soda (diet and regular), Sweet Tea, Candies, Chips, Cookies, Sweet Pastries, Store Bought Juices, Alcohol in Excess of 1-2 drinks a day, Artificial Sweeteners, Coffee Creamer, and "Sugar-free" Products. This will help patient to have stable blood glucose profile and potentially avoid unintended weight gain.   - I encouraged the patient to switch to unprocessed or minimally processed complex starch and increased protein intake (animal or plant source), fruits, and vegetables.   - Patient is advised to stick to a routine mealtimes to eat 3 meals a day and avoid unnecessary snacks (to snack only to correct hypoglycemia).  - I have approached patient with the following individualized plan to manage diabetes and patient agrees.  -She is advised to continue her Toujeo 80 units SQ nightly, continue her Humalog 22-28 units TID with meals if glucose is above 90 and she is eating (Specific instructions on how to titrate insulin dosage based on glucose readings given to patient in writing), and Metformin 1000 mg ER daily with breakfast.  I did lower her Ozempic back to 1 mg SQ weekly for now since she is not tolerating the higher dose.  -She is encouraged to continue to monitor blood glucose at least 4 times a day (now using her CGM), before meals and at bedtime and report blood glucose readings less than 70 or greater than 200 for 3 tests in a row.    - Patient specific target  for A1c; LDL, HDL, Triglycerides, and  Waist Circumference were discussed in detail.  2)  BP/HTN:  Her blood pressure is controlled to  target.  She is advised to continue Atenolol 50 mg p.o. twice daily and Losartan 50 mg p.o. daily.    3) Lipids/HPL: Her most recent lipid panel from 12/06/21 show controlled LDL at 80.  She is advised to continue Crestor 20 mg po daily at bedtime.  Side effects and precautions discussed with her.    4)  Weight/Diet:  Her Body mass index is 35.11 kg/m. -clearly complicating her diabetes management.  She is a candidate for modest weight loss.  CDE consult in progress, exercise, and carbohydrates information provided.  5) Chronic Care/Health Maintenance: -Patient is on ACE and Statin medications and is encouraged to continue to follow up with Ophthalmology, Podiatrist at least yearly or according to recommendations, and advised to  stay away from smoking. I have recommended yearly flu vaccine and pneumonia vaccination at least every 5 years; moderate intensity exercise for up to 150 minutes weekly; and  sleep for at least 7 hours a day.  I advised patient to maintain close follow up with her PCP for primary care needs and regarding her skin lesion.     I spent  54  minutes in the care of the patient today including review of labs from [place], Lipids, Thyroid Function, Hematology (current and previous including abstractions from other facilities); face-to-face time discussing  her blood glucose readings/logs, discussing hypoglycemia and hyperglycemia episodes and symptoms, medications doses, her options of short and long term treatment based on the latest standards of care / guidelines;  discussion about incorporating lifestyle medicine;  and documenting the encounter. Risk reduction counseling performed per USPSTF guidelines to reduce obesity and cardiovascular risk factors.     Please refer to Patient Instructions for Blood Glucose Monitoring and Insulin/Medications Dosing Guide"  in media tab for additional information. Please  also refer to " Patient Self Inventory" in the Media  tab for reviewed  elements of pertinent patient history.  Melanie Schultz participated in the discussions, expressed understanding, and voiced agreement with the above plans.  All questions were answered to her satisfaction. she is encouraged to contact clinic should she have any questions or concerns prior to her return visit.     Follow up plan: Return in about 3 months (around 07/09/2022) for Diabetes F/U with A1c in office, No previsit labs, Bring meter and logs.   Melanie Schultz, [facility] [address] [place], [place] [address] Phone: [phone] Fax: [phone]  04/08/2022, 3:01 PM

## 2022-07-08 LAB — HEPATIC FUNCTION PANEL
ALT: 36 U/L — AB (ref 7–35)
AST: 33 (ref 13–35)

## 2022-07-08 LAB — LIPID PANEL
HDL: 37 (ref 35–70)
LDL Cholesterol: 113
Triglycerides: 237 — AB (ref 40–160)

## 2022-07-08 LAB — COMPREHENSIVE METABOLIC PANEL: eGFR: 87

## 2022-07-08 LAB — PROTEIN / CREATININE RATIO, URINE: Creatinine, Urine: 70

## 2022-07-08 LAB — BASIC METABOLIC PANEL
BUN: 12 (ref 4–21)
Creatinine: 0.8 (ref 0.5–1.1)
Glucose: 139

## 2022-07-08 LAB — VITAMIN D 25 HYDROXY (VIT D DEFICIENCY, FRACTURES): Vit D, 25-Hydroxy: 46

## 2022-07-08 LAB — MICROALBUMIN / CREATININE URINE RATIO: Microalb Creat Ratio: 16

## 2022-07-09 ENCOUNTER — Ambulatory Visit: Payer: Medicare Other | Admitting: Nurse Practitioner

## 2022-07-09 ENCOUNTER — Encounter: Payer: Self-pay | Admitting: Nurse Practitioner

## 2022-07-09 VITALS — BP 134/80 | HR 80 | Ht 65.0 in | Wt 219.8 lb

## 2022-07-09 DIAGNOSIS — E559 Vitamin D deficiency, unspecified: Secondary | ICD-10-CM | POA: Diagnosis not present

## 2022-07-09 DIAGNOSIS — Z7984 Long term (current) use of oral hypoglycemic drugs: Secondary | ICD-10-CM

## 2022-07-09 DIAGNOSIS — I10 Essential (primary) hypertension: Secondary | ICD-10-CM | POA: Diagnosis not present

## 2022-07-09 DIAGNOSIS — Z7985 Long-term (current) use of injectable non-insulin antidiabetic drugs: Secondary | ICD-10-CM

## 2022-07-09 DIAGNOSIS — Z794 Long term (current) use of insulin: Secondary | ICD-10-CM

## 2022-07-09 DIAGNOSIS — E782 Mixed hyperlipidemia: Secondary | ICD-10-CM | POA: Diagnosis not present

## 2022-07-09 DIAGNOSIS — E118 Type 2 diabetes mellitus with unspecified complications: Secondary | ICD-10-CM

## 2022-07-09 DIAGNOSIS — E1169 Type 2 diabetes mellitus with other specified complication: Secondary | ICD-10-CM | POA: Diagnosis not present

## 2022-07-09 MED ORDER — INSULIN LISPRO (1 UNIT DIAL) 100 UNIT/ML (KWIKPEN): 22.0000 [IU] | PEN_INJECTOR | Freq: Three times a day (TID) | SUBCUTANEOUS | 3 refills | Status: DC

## 2022-07-09 MED ORDER — SITAGLIPTIN PHOSPHATE 50 MG PO TABS: 50.0000 mg | ORAL_TABLET | Freq: Every day | ORAL | 1 refills | Status: DC

## 2022-07-09 MED ORDER — TOUJEO MAX SOLOSTAR 300 UNIT/ML ~~LOC~~ SOPN: 80.0000 [IU] | PEN_INJECTOR | Freq: Every evening | SUBCUTANEOUS | 3 refills | Status: DC

## 2022-07-09 NOTE — Progress Notes (Signed)
07/09/2022   Endocrinology follow-up note    Subjective:    Patient ID: Melanie Schultz, female    DOB: 12/22/1955,  Melanie Schultz presents today for follow up of uncontrolled type 2 diabetes, hypertension, and hyperlipidemia.  Past Medical History:  Diagnosis Date   Anxiety    Depression    Diabetes mellitus without complication (HCC)    GERD (gastroesophageal reflux disease)    Hypertension    Liver fibrosis    Multiple sclerosis (HCC)    PONV (postoperative nausea and vomiting)    Sleep apnea    Past Surgical History:  Procedure Laterality Date   BREAST BIOPSY Left    BREAST EXCISIONAL BIOPSY Left    benign   CESAREAN SECTION     ENDOMETRIAL ABLATION     NASAL SEPTUM SURGERY     TRIGGER FINGER RELEASE Left 05/04/2021   Procedure: RELEASE TRIGGER FINGER/A-1 PULLEY;  Surgeon: Vickki Hearing, MD;  Location: [facility];  Service: Orthopedics;  Laterality: Left;   Social History   Socioeconomic History   Marital status: Divorced    Spouse name: Not on file   Number of children: Not on file   Years of education: Not on file   Highest education level: Not on file  Occupational History   Not on file  Tobacco Use   Smoking status: Former    Types: Cigarettes    Quit date: 02/04/1978    Years since quitting: 44.4   Smokeless tobacco: Never  Vaping Use   Vaping Use: Never used  Substance and Sexual Activity   Alcohol use: No    Alcohol/week: 0.0 standard drinks of alcohol   Drug use: No   Sexual activity: Not on file  Other Topics Concern   Not on file  Social History Narrative   Not on file   Social Determinants of Health   Financial Resource Strain: Not on file  Food Insecurity: Not on file  Transportation Needs: Not on file  Physical Activity: Not on file  Stress: Not on file  Social Connections: Not on file   Outpatient Encounter Medications as of 07/09/2022  Medication Sig   Accu-Chek Softclix Lancets lancets 100 each by Other route 4 (four) times  daily. Use as instructed   ARIPiprazole (ABILIFY) 2 MG tablet Take 2 mg by mouth in the morning.   Ascorbic Acid (VITAMIN C) 1000 MG tablet Take 1,000 mg by mouth in the morning.   atenolol (TENORMIN) 50 MG tablet Take 1 tablet (50 mg total) by mouth 2 (two) times daily.   B-D ULTRAFINE III SHORT PEN 31G X 8 MM MISC USE PEN NEEDLE 4 TIMES DAILY AS  DIRECTED   B-D ULTRAFINE III SHORT PEN 31G X 8 MM MISC USE 1 PEN NEEDLE 4 TIMES DAILY AS DIRECTED   betamethasone valerate ointment (VALISONE) 0.1 % Apply twice daily to affected areas as needed for itching. Avoid applying to face, groin, and axilla.   Cholecalciferol (VITAMIN D3) 125 MCG (5000 UT) CAPS Take 5,000 Units by mouth every evening.   Cholecalciferol 125 MCG (5000 UT) capsule 1 capsule Orally once a day   Coenzyme Q10 (COQ-10) 100 MG CAPS Take 100 mg by mouth at bedtime.   Continuous Blood Gluc Receiver (DEXCOM G7 RECEIVER) DEVI See admin instructions.   Cyanocobalamin (VITAMIN B-12) 5000 MCG TBDP Take 5,000 mcg by mouth in the morning.   Evening Primrose Oil 1000 MG CAPS Take 1,000 mg by mouth at bedtime.   fenofibrate 54  MG tablet Take 54 mg by mouth daily.   ferrous sulfate 325 (65 FE) MG tablet Take 325 mg by mouth at bedtime.   FLUoxetine (PROZAC) 40 MG capsule 1 capsule Orally Once a day   glucose blood (ACCU-CHEK AVIVA PLUS) test strip TEST 4 TIMES DAILY E11.65   Homeopathic Products (LIVER SUPPORT SL) Take 2 tablets by mouth in the morning.   Krill Oil 300 MG CAPS Take 300 mg by mouth every evening.   loratadine (CLARITIN) 10 MG tablet Take 10 mg by mouth every evening.   losartan (COZAAR) 50 MG tablet Take 50 mg by mouth every evening.   magnesium oxide (MAG-OX) 400 MG tablet Take 400 mg by mouth at bedtime.   metFORMIN (GLUCOPHAGE-XR) 500 MG 24 hr tablet TAKE 2 TABLETS BY MOUTH ONCE DAILY WITH BREAKFAST   methylphenidate (RITALIN) 20 MG tablet Take 20 mg by mouth 4 (four) times daily.   Milk Thistle 175 MG CAPS Take 175 mg by  mouth in the morning.   Multiple Vitamin (MULTIVITAMIN WITH MINERALS) TABS tablet Take 1 tablet by mouth in the morning. Centrum   Multiple Vitamins-Minerals (ZINC PO) Take 1 tablet by mouth in the morning.   omeprazole (PRILOSEC) 20 MG capsule Take 20 mg by mouth every evening.   Probiotic Product (PROBIOTIC PO) Take 1 capsule by mouth in the morning.   rosuvastatin (CRESTOR) 20 MG tablet Take 20 mg by mouth in the morning.   sitaGLIPtin (JANUVIA) 50 MG tablet Take 1 tablet (50 mg total) by mouth daily.   VASCEPA 1 g capsule 2 g 2 (two) times daily.   [DISCONTINUED] insulin glargine, 2 Unit Dial, (TOUJEO MAX SOLOSTAR) 300 UNIT/ML Solostar Pen Inject 80 Units into the skin at bedtime.   [DISCONTINUED] insulin lispro (HUMALOG KWIKPEN) 100 UNIT/ML KwikPen Inject 22-28 Units into the skin 3 (three) times daily with meals.   insulin glargine, 2 Unit Dial, (TOUJEO MAX SOLOSTAR) 300 UNIT/ML Solostar Pen Inject 80 Units into the skin at bedtime.   insulin lispro (HUMALOG KWIKPEN) 100 UNIT/ML KwikPen Inject 22-28 Units into the skin 3 (three) times daily with meals.   [DISCONTINUED] Semaglutide, 1 MG/DOSE, 4 MG/3ML SOPN Inject 1 mg as directed once a week. (Patient not taking: Reported on 07/09/2022)   No facility-administered encounter medications on file as of 07/09/2022.   ALLERGIES: Allergies  Allergen Reactions   Lisinopril Cough    Other reaction(s): cough   Mango Flavor Rash    Mango fruit-rash   VACCINATION STATUS:  There is no immunization history on file for this patient.  Diabetes She presents for her follow-up diabetic visit. She has type 2 diabetes mellitus. Onset time: She was diagnosed at approximate age of 48 years. Her disease course has been worsening. There are no hypoglycemic associated symptoms. Pertinent negatives for hypoglycemia include no confusion, headaches, pallor or seizures. Associated symptoms include foot paresthesias. Pertinent negatives for diabetes include no chest  pain, no fatigue, no polydipsia, no polyphagia, no polyuria and no weight loss. There are no hypoglycemic complications. Symptoms are stable. Diabetic complications include nephropathy and peripheral neuropathy. Risk factors for coronary artery disease include dyslipidemia, hypertension, obesity, sedentary lifestyle and diabetes mellitus. Current diabetic treatment includes intensive insulin program and oral agent (monotherapy) (and Ozempic). She is compliant with treatment most of the time. Her weight is increasing steadily. She is following a generally healthy diet. When asked about meal planning, she reported none. She has had a previous visit with a dietitian. She participates in exercise  intermittently. Her home blood glucose trend is increasing steadily. Her overall blood glucose range is >200 mg/dl. (She presents today with her CGM showing worsening glycemic profile.  Her POCT A1c today is 8.2%, increasing from last visit of 7.1%.  she was not able to tolerate the Ozempic, even in the lower dosage, due to nausea/vomiting/constipation, therefore she stopped it about 6 weeks ago.  As a result her glucose has increased.  She sometimes will take higher dose of Toujeo to help bring high bedtime readings down and has taken an additional dose of Humalog as well.  Analysis of her CGM shows TIR 24%, TAR 76%, TBR 0% with a GMI of 9%.) An ACE inhibitor/angiotensin II receptor blocker is being taken. She does not see a podiatrist.Eye exam is current.  Hyperlipidemia This is a chronic problem. The current episode started more than 1 year ago. The problem is uncontrolled. Recent lipid tests were reviewed and are variable. Exacerbating diseases include chronic renal disease, diabetes and obesity. Factors aggravating her hyperlipidemia include fatty foods, beta blockers and thiazides. Pertinent negatives include no chest pain, myalgias or shortness of breath. Current antihyperlipidemic treatment includes statins. The  current treatment provides mild improvement of lipids. Compliance problems include adherence to diet.  Risk factors for coronary artery disease include diabetes mellitus, dyslipidemia, a sedentary lifestyle, post-menopausal, obesity and hypertension.  Hypertension This is a chronic problem. The current episode started more than 1 year ago. The problem has been resolved since onset. The problem is controlled. Pertinent negatives include no chest pain, headaches, palpitations or shortness of breath. Agents associated with hypertension include amphetamines. Risk factors for coronary artery disease include diabetes mellitus, dyslipidemia, post-menopausal state, sedentary lifestyle and smoking/tobacco exposure. Past treatments include angiotensin blockers, beta blockers and diuretics. The current treatment provides mild improvement. Compliance problems include diet and exercise.  Hypertensive end-organ damage includes kidney disease. Identifiable causes of hypertension include chronic renal disease.     Review of systems  Constitutional: + increasing body weight,  current Body mass index is 36.58 kg/m. , no fatigue, no subjective hyperthermia, no subjective hypothermia Eyes: no blurry vision, no xerophthalmia ENT: no sore throat, no nodules palpated in throat, no dysphagia/odynophagia, no hoarseness Cardiovascular: no chest pain, no shortness of breath, no palpitations, no leg swelling Respiratory: no cough, no shortness of breath Gastrointestinal: no nausea/vomiting/diarrhea Musculoskeletal: no muscle/joint aches, + muscle weakness (history of MS) Skin: no rashes, no hyperemia, + hair loss from MS medication Neurological: no tremors, + numbness/tingling to BLE (esp bottom of feet), no dizziness Psychiatric: no depression, no anxiety    Objective:    BP 134/80 (BP Location: Left Arm, Patient Position: Sitting, Cuff Size: Large)   Pulse 80   Ht 5\' 5"  (1.651 m)   Wt 219 lb 12.8 oz (99.7 kg)    BMI 36.58 kg/m   Wt Readings from Last 3 Encounters:  07/09/22 219 lb 12.8 oz (99.7 kg)  04/08/22 211 lb (95.7 kg)  01/03/22 216 lb 3.2 oz (98.1 kg)    BP Readings from Last 3 Encounters:  07/09/22 134/80  04/08/22 126/88  02/19/22 (!) 145/84      Physical Exam- Limited  Constitutional:  Body mass index is 36.58 kg/m. , not in acute distress, normal state of mind Eyes:  EOMI, no exophthalmos Neck: Supple Musculoskeletal: no gross deformities, strength intact in all four extremities, no gross restriction of joint movements Skin:  no rashes, no hyperemia Neurological: no tremor with outstretched hands   Diabetic Foot Exam -  Simple   No data filed     Results for orders placed or performed in visit on 07/09/22  Microalbumin / creatinine urine ratio  Result Value Ref Range   Microalb Creat Ratio 16   Protein / creatinine ratio, urine  Result Value Ref Range   Creatinine, Urine 70   Basic metabolic panel  Result Value Ref Range   Glucose 139    BUN 12 4 - 21   Creatinine 0.8 0.5 - 1.1  Comprehensive metabolic panel  Result Value Ref Range   eGFR 87   Lipid panel  Result Value Ref Range   Triglycerides 237 (A) 40 - 160   HDL 37 35 - 70   LDL Cholesterol 113   Hepatic function panel  Result Value Ref Range   ALT 36 (A) 7 - 35 U/L   AST 33 13 - 35  VITAMIN D 25 Hydroxy (Vit-D Deficiency, Fractures)  Result Value Ref Range   Vit D, 25-Hydroxy 46    Lipid Panel     Component Value Date/Time   CHOL 143 07/25/2021 1646   CHOL 156 07/31/2020 0834   TRIG 237 (A) 07/08/2022 0000   HDL 37 07/08/2022 0000   HDL 33 (L) 07/31/2020 0834   CHOLHDL 4.7 (H) 07/31/2020 0834   CHOLHDL 4.2 06/17/2019 0818   VLDL 57 (H) 09/04/2016 1036   LDLCALC 113 07/08/2022 0000   LDLCALC 90 07/31/2020 0834   LDLCALC 90 06/17/2019 0818   Recent Results (from the past 2160 hour(s))  Microalbumin / creatinine urine ratio     Status: None   Collection Time: 07/08/22 12:00 AM  Result  Value Ref Range   Microalb Creat Ratio 16   Protein / creatinine ratio, urine     Status: None   Collection Time: 07/08/22 12:00 AM  Result Value Ref Range   Creatinine, Urine 70   Basic metabolic panel     Status: None   Collection Time: 07/08/22 12:00 AM  Result Value Ref Range   Glucose 139    BUN 12 4 - 21   Creatinine 0.8 0.5 - 1.1  Comprehensive metabolic panel     Status: None   Collection Time: 07/08/22 12:00 AM  Result Value Ref Range   eGFR 87   Lipid panel     Status: Abnormal   Collection Time: 07/08/22 12:00 AM  Result Value Ref Range   Triglycerides 237 (A) 40 - 160   HDL 37 35 - 70   LDL Cholesterol 113   Hepatic function panel     Status: Abnormal   Collection Time: 07/08/22 12:00 AM  Result Value Ref Range   ALT 36 (A) 7 - 35 U/L   AST 33 13 - 35  VITAMIN D 25 Hydroxy (Vit-D Deficiency, Fractures)     Status: None   Collection Time: 07/08/22 12:00 AM  Result Value Ref Range   Vit D, 25-Hydroxy 46         Assessment & Plan:   1) Controlled type 2 diabetes mellitus without complication, with long-term current use of insulin (HCC)  She presents today with her CGM showing worsening glycemic profile.  Her POCT A1c today is 8.2%, increasing from last visit of 7.1%.  she was not able to tolerate the Ozempic, even in the lower dosage, due to nausea/vomiting/constipation, therefore she stopped it about 6 weeks ago.  As a result her glucose has increased.  She sometimes will take higher dose of Toujeo to help bring  high bedtime readings down and has taken an additional dose of Humalog as well.  Analysis of her CGM shows TIR 24%, TAR 76%, TBR 0% with a GMI of 9%.   Recent labs reviewed showing elevated liver function tests (following up with PCP about this).  - Patient remains at a high risk for more acute and chronic complications of diabetes which include CAD, CVA, CKD, retinopathy, and neuropathy. These are all discussed in detail with the patient.  -  Nutritional counseling repeated at each appointment due to patients tendency to fall back in to old habits.  - The patient admits there is a room for improvement in their diet and drink choices. -  Suggestion is made for the patient to avoid simple carbohydrates from their diet including Cakes, Sweet Desserts / Pastries, Ice Cream, Soda (diet and regular), Sweet Tea, Candies, Chips, Cookies, Sweet Pastries, Store Bought Juices, Alcohol in Excess of 1-2 drinks a day, Artificial Sweeteners, Coffee Creamer, and "Sugar-free" Products. This will help patient to have stable blood glucose profile and potentially avoid unintended weight gain.   - I encouraged the patient to switch to unprocessed or minimally processed complex starch and increased protein intake (animal or plant source), fruits, and vegetables.   - Patient is advised to stick to a routine mealtimes to eat 3 meals a day and avoid unnecessary snacks (to snack only to correct hypoglycemia).  - I have approached patient with the following individualized plan to manage diabetes and patient agrees.  -She is advised to continue her Toujeo 80 units SQ nightly, continue her Humalog 22-28 units TID with meals if glucose is above 90 and she is eating (Specific instructions on how to titrate insulin dosage based on glucose readings given to patient in writing), and Metformin 1000 mg ER daily with breakfast.  She did not tolerate Ozempic, even in lower dosage, therefore that will be discontinued.  Instead, will trial Januvia 50 mg po daily with breakfast to help with postprandial spikes in glucose.  -She is encouraged to continue to monitor blood glucose at least 4 times a day (now using her CGM), before meals and at bedtime and report blood glucose readings less than 70 or greater than 200 for 3 tests in a row.    - Patient specific target  for A1c; LDL, HDL, Triglycerides, and  Waist Circumference were discussed in detail.  2) BP/HTN:  Her blood  pressure is controlled to target.  She is advised to continue Atenolol 50 mg p.o. twice daily and Losartan 50 mg p.o. daily.    3) Lipids/HPL: Her most recent lipid panel from 07/08/22 show uncontrolled LDL at 113 and elevated triglycerides of 237.  She is advised to continue Crestor 20 mg po daily at bedtime and Vascepa 1 g po BID (says she has been forgetting her second dose).  Side effects and precautions discussed with her.    4)  Weight/Diet:  Her Body mass index is 36.58 kg/m. -clearly complicating her diabetes management.  She is a candidate for modest weight loss.  CDE consult in progress, exercise, and carbohydrates information provided.  5) Chronic Care/Health Maintenance: -Patient is on ACE and Statin medications and is encouraged to continue to follow up with Ophthalmology, Podiatrist at least yearly or according to recommendations, and advised to  stay away from smoking. I have recommended yearly flu vaccine and pneumonia vaccination at least every 5 years; moderate intensity exercise for up to 150 minutes weekly; and  sleep for at least  7 hours a day.  I advised patient to maintain close follow up with her PCP for primary care needs and regarding her skin lesion.      I spent  41  minutes in the care of the patient today including review of labs from CMP, Lipids, Thyroid Function, Hematology (current and previous including abstractions from other facilities); face-to-face time discussing  her blood glucose readings/logs, discussing hypoglycemia and hyperglycemia episodes and symptoms, medications doses, her options of short and long term treatment based on the latest standards of care / guidelines;  discussion about incorporating lifestyle medicine;  and documenting the encounter. Risk reduction counseling performed per USPSTF guidelines to reduce obesity and cardiovascular risk factors.     Please refer to Patient Instructions for Blood Glucose Monitoring and Insulin/Medications  Dosing Guide"  in media tab for additional information. Please  also refer to " Patient Self Inventory" in the Media  tab for reviewed elements of pertinent patient history.  Alben Spittle Varnum participated in the discussions, expressed understanding, and voiced agreement with the above plans.  All questions were answered to her satisfaction. she is encouraged to contact clinic should she have any questions or concerns prior to her return visit.     Follow up plan: Return in about 3 months (around 10/09/2022) for Diabetes F/U with A1c in office, No previsit labs, Bring meter and logs.   Ronny Bacon, [facility] [address] [place], [place] [address] Phone: [phone] Fax: [phone]  07/09/2022, 3:46 PM

## 2022-08-01 ENCOUNTER — Other Ambulatory Visit (HOSPITAL_COMMUNITY): Payer: Self-pay | Admitting: Neurology

## 2022-08-01 DIAGNOSIS — G35 Multiple sclerosis: Secondary | ICD-10-CM

## 2022-08-21 ENCOUNTER — Other Ambulatory Visit: Payer: Self-pay | Admitting: Nurse Practitioner

## 2022-08-27 ENCOUNTER — Ambulatory Visit (HOSPITAL_COMMUNITY)
Admission: RE | Admit: 2022-08-27 | Discharge: 2022-08-27 | Disposition: A | Discharge status: Home / Self Care | Payer: Medicare Other | Source: Ambulatory Visit | Attending: Neurology | Admitting: Neurology

## 2022-08-27 DIAGNOSIS — G35 Multiple sclerosis: Secondary | ICD-10-CM | POA: Insufficient documentation

## 2022-08-27 MED ORDER — GADOBUTROL 1 MMOL/ML IV SOLN
10.0000 mL | Freq: Once | INTRAVENOUS | Status: AC | PRN
  Administered 2022-08-27: 10 mL via INTRAVENOUS

## 2022-09-05 ENCOUNTER — Other Ambulatory Visit: Payer: Self-pay

## 2022-09-05 DIAGNOSIS — E118 Type 2 diabetes mellitus with unspecified complications: Secondary | ICD-10-CM

## 2022-09-05 MED ORDER — TOUJEO MAX SOLOSTAR 300 UNIT/ML ~~LOC~~ SOPN: 80.0000 [IU] | PEN_INJECTOR | Freq: Every evening | SUBCUTANEOUS | 0 refills | Status: DC

## 2022-09-05 MED ORDER — RELION PEN NEEDLES 31G X 8 MM MISC: 0 refills | Status: DC

## 2022-09-05 MED ORDER — METFORMIN HCL ER 500 MG PO TB24: ORAL_TABLET | ORAL | 0 refills | Status: DC

## 2022-09-05 MED ORDER — INSULIN LISPRO (1 UNIT DIAL) 100 UNIT/ML (KWIKPEN): 22.0000 [IU] | PEN_INJECTOR | Freq: Three times a day (TID) | SUBCUTANEOUS | 0 refills | Status: DC

## 2022-09-05 MED ORDER — SITAGLIPTIN PHOSPHATE 50 MG PO TABS: 50.0000 mg | ORAL_TABLET | Freq: Every day | ORAL | 0 refills | Status: DC

## 2022-09-11 ENCOUNTER — Other Ambulatory Visit: Payer: Self-pay

## 2022-09-11 MED ORDER — RELION PEN NEEDLES 31G X 8 MM MISC: 0 refills | Status: DC

## 2022-09-19 ENCOUNTER — Telehealth: Payer: Self-pay | Admitting: Nurse Practitioner

## 2022-09-19 MED ORDER — RELION PEN NEEDLES 31G X 8 MM MISC: 0 refills | Status: DC

## 2022-09-19 NOTE — Telephone Encounter (Signed)
Refill [facility]

## 2022-10-10 ENCOUNTER — Ambulatory Visit: Payer: 59 | Admitting: Nurse Practitioner

## 2022-10-10 ENCOUNTER — Encounter: Payer: Self-pay | Admitting: Nurse Practitioner

## 2022-10-10 VITALS — BP 105/63 | HR 83 | Ht 65.0 in | Wt 225.0 lb

## 2022-10-10 DIAGNOSIS — E118 Type 2 diabetes mellitus with unspecified complications: Secondary | ICD-10-CM

## 2022-10-10 DIAGNOSIS — E782 Mixed hyperlipidemia: Secondary | ICD-10-CM | POA: Diagnosis not present

## 2022-10-10 DIAGNOSIS — E559 Vitamin D deficiency, unspecified: Secondary | ICD-10-CM | POA: Diagnosis not present

## 2022-10-10 DIAGNOSIS — Z794 Long term (current) use of insulin: Secondary | ICD-10-CM

## 2022-10-10 DIAGNOSIS — Z7985 Long-term (current) use of injectable non-insulin antidiabetic drugs: Secondary | ICD-10-CM

## 2022-10-10 DIAGNOSIS — E1169 Type 2 diabetes mellitus with other specified complication: Secondary | ICD-10-CM | POA: Diagnosis not present

## 2022-10-10 DIAGNOSIS — I10 Essential (primary) hypertension: Secondary | ICD-10-CM

## 2022-10-10 DIAGNOSIS — Z7984 Long term (current) use of oral hypoglycemic drugs: Secondary | ICD-10-CM

## 2022-10-10 LAB — POCT GLYCOSYLATED HEMOGLOBIN (HGB A1C): Hemoglobin A1C: 8.5 % — AB (ref 4.0–5.6)

## 2022-10-10 MED ORDER — RELION PEN NEEDLES 31G X 8 MM MISC: 3 refills | Status: DC

## 2022-10-10 MED ORDER — INSULIN LISPRO (1 UNIT DIAL) 100 UNIT/ML (KWIKPEN): 22.0000 [IU] | PEN_INJECTOR | Freq: Three times a day (TID) | SUBCUTANEOUS | 3 refills | Status: DC

## 2022-10-10 MED ORDER — METFORMIN HCL ER 500 MG PO TB24: 1000.0000 mg | ORAL_TABLET | Freq: Every day | ORAL | 3 refills | Status: DC

## 2022-10-10 MED ORDER — TOUJEO MAX SOLOSTAR 300 UNIT/ML ~~LOC~~ SOPN: 80.0000 [IU] | PEN_INJECTOR | Freq: Every evening | SUBCUTANEOUS | 3 refills | Status: DC

## 2022-10-10 MED ORDER — SITAGLIPTIN PHOSPHATE 100 MG PO TABS: 100.0000 mg | ORAL_TABLET | Freq: Every day | ORAL | 1 refills | Status: DC

## 2022-10-10 NOTE — Progress Notes (Signed)
10/10/2022   Endocrinology follow-up note    Subjective:    Patient ID: Melanie Schultz, female    DOB: 15-Jan-1956,  Melanie Schultz presents today for follow up of uncontrolled type 2 diabetes, hypertension, and hyperlipidemia.  Past Medical History:  Diagnosis Date   Anxiety    Depression    Diabetes mellitus without complication (HCC)    GERD (gastroesophageal reflux disease)    Hypertension    Liver fibrosis    Multiple sclerosis (HCC)    PONV (postoperative nausea and vomiting)    Sleep apnea    Past Surgical History:  Procedure Laterality Date   BREAST BIOPSY Left    BREAST EXCISIONAL BIOPSY Left    benign   CESAREAN SECTION     ENDOMETRIAL ABLATION     NASAL SEPTUM SURGERY     TRIGGER FINGER RELEASE Left 05/04/2021   Procedure: RELEASE TRIGGER FINGER/A-1 PULLEY;  Surgeon: Vickki Hearing, MD;  Location: [facility];  Service: Orthopedics;  Laterality: Left;   Social History   Socioeconomic History   Marital status: Divorced    Spouse name: Not on file   Number of children: Not on file   Years of education: Not on file   Highest education level: Not on file  Occupational History   Not on file  Tobacco Use   Smoking status: Former    Current packs/day: 0.00    Types: Cigarettes    Quit date: 02/04/1978    Years since quitting: 44.7   Smokeless tobacco: Never  Vaping Use   Vaping status: Never Used  Substance and Sexual Activity   Alcohol use: No    Alcohol/week: 0.0 standard drinks of alcohol   Drug use: No   Sexual activity: Not on file  Other Topics Concern   Not on file  Social History Narrative   Not on file   Social Determinants of Health   Financial Resource Strain: Not on file  Food Insecurity: Not on file  Transportation Needs: Not on file  Physical Activity: Not on file  Stress: Not on file  Social Connections: Not on file   Outpatient Encounter Medications as of 10/10/2022  Medication Sig   Accu-Chek Softclix Lancets lancets 100 each by  Other route 4 (four) times daily. Use as instructed   ARIPiprazole (ABILIFY) 2 MG tablet Take 2 mg by mouth in the morning.   Ascorbic Acid (VITAMIN C) 1000 MG tablet Take 1,000 mg by mouth in the morning.   atenolol (TENORMIN) 50 MG tablet Take 1 tablet (50 mg total) by mouth 2 (two) times daily.   betamethasone valerate ointment (VALISONE) 0.1 % Apply twice daily to affected areas as needed for itching. Avoid applying to face, groin, and axilla.   Cholecalciferol (VITAMIN D3) 125 MCG (5000 UT) CAPS Take 5,000 Units by mouth every evening.   Coenzyme Q10 (COQ-10) 100 MG CAPS Take 100 mg by mouth at bedtime.   Continuous Blood Gluc Receiver (DEXCOM G7 RECEIVER) DEVI See admin instructions.   Cyanocobalamin (VITAMIN B-12) 5000 MCG TBDP Take 5,000 mcg by mouth in the morning.   Evening Primrose Oil 1000 MG CAPS Take 1,000 mg by mouth at bedtime.   ferrous sulfate 325 (65 FE) MG tablet Take 325 mg by mouth at bedtime.   FLUoxetine (PROZAC) 40 MG capsule 1 capsule Orally Once a day   glucose blood (ACCU-CHEK AVIVA PLUS) test strip TEST 4 TIMES DAILY E11.65   Homeopathic Products (LIVER SUPPORT SL) Take 2 tablets by  mouth in the morning.   Krill Oil 300 MG CAPS Take 300 mg by mouth every evening.   loratadine (CLARITIN) 10 MG tablet Take 10 mg by mouth every evening.   magnesium oxide (MAG-OX) 400 MG tablet Take 400 mg by mouth at bedtime.   methylphenidate (RITALIN) 20 MG tablet Take 20 mg by mouth 4 (four) times daily.   Milk Thistle 175 MG CAPS Take 175 mg by mouth in the morning.   Multiple Vitamin (MULTIVITAMIN WITH MINERALS) TABS tablet Take 1 tablet by mouth in the morning. Centrum   Multiple Vitamins-Minerals (ZINC PO) Take 1 tablet by mouth in the morning.   omeprazole (PRILOSEC) 20 MG capsule Take 20 mg by mouth every evening.   Probiotic Product (PROBIOTIC PO) Take 1 capsule by mouth in the morning.   rosuvastatin (CRESTOR) 20 MG tablet Take 20 mg by mouth in the morning.   VASCEPA 1 g  capsule 2 g 2 (two) times daily.   [DISCONTINUED] insulin glargine, 2 Unit Dial, (TOUJEO MAX SOLOSTAR) 300 UNIT/ML Solostar Pen Inject 80 Units into the skin at bedtime.   [DISCONTINUED] insulin lispro (HUMALOG KWIKPEN) 100 UNIT/ML KwikPen Inject 22-28 Units into the skin 3 (three) times daily with meals.   [DISCONTINUED] Insulin Pen Needle (RELION PEN NEEDLE 31G/8MM) 31G X 8 MM MISC USE WITH INSULIN QID. E11.65   [DISCONTINUED] metFORMIN (GLUCOPHAGE-XR) 500 MG 24 hr tablet TAKE 2 TABLETS BY MOUTH ONCE DAILY WITH BREAKFAST   [DISCONTINUED] sitaGLIPtin (JANUVIA) 50 MG tablet Take 1 tablet (50 mg total) by mouth daily.   Cholecalciferol 125 MCG (5000 UT) capsule 1 capsule Orally once a day (Patient not taking: Reported on 10/10/2022)   fenofibrate 54 MG tablet Take 54 mg by mouth daily. (Patient not taking: Reported on 10/10/2022)   insulin glargine, 2 Unit Dial, (TOUJEO MAX SOLOSTAR) 300 UNIT/ML Solostar Pen Inject 80 Units into the skin at bedtime.   insulin lispro (HUMALOG KWIKPEN) 100 UNIT/ML KwikPen Inject 22-28 Units into the skin 3 (three) times daily with meals.   Insulin Pen Needle (RELION PEN NEEDLE 31G/8MM) 31G X 8 MM MISC USE WITH INSULIN QID. E11.65   losartan (COZAAR) 50 MG tablet Take 50 mg by mouth every evening. (Patient not taking: Reported on 10/10/2022)   metFORMIN (GLUCOPHAGE-XR) 500 MG 24 hr tablet Take 2 tablets (1,000 mg total) by mouth daily with breakfast. TAKE 2 TABLETS BY MOUTH ONCE DAILY WITH BREAKFAST   sitaGLIPtin (JANUVIA) 100 MG tablet Take 1 tablet (100 mg total) by mouth daily.   No facility-administered encounter medications on file as of 10/10/2022.   ALLERGIES: Allergies  Allergen Reactions   Lisinopril Cough    Other reaction(s): cough   Mango Flavor Rash    Mango fruit-rash   VACCINATION STATUS:  There is no immunization history on file for this patient.  Diabetes She presents for her follow-up diabetic visit. She has type 2 diabetes mellitus. Onset time:  She was diagnosed at approximate age of 48 years. Her disease course has been worsening. There are no hypoglycemic associated symptoms. Pertinent negatives for hypoglycemia include no confusion, headaches, pallor or seizures. Associated symptoms include foot paresthesias. Pertinent negatives for diabetes include no chest pain, no fatigue, no polydipsia, no polyphagia, no polyuria and no weight loss. There are no hypoglycemic complications. Symptoms are stable. Diabetic complications include nephropathy and peripheral neuropathy. Risk factors for coronary artery disease include dyslipidemia, hypertension, obesity, sedentary lifestyle and diabetes mellitus. Current diabetic treatment includes intensive insulin program and oral agent (  dual therapy). She is compliant with treatment most of the time. Her weight is increasing steadily. She is following a generally healthy diet. When asked about meal planning, she reported none. She has had a previous visit with a dietitian. She participates in exercise intermittently. Her home blood glucose trend is fluctuating minimally. Her overall blood glucose range is >200 mg/dl. (She presents today with her CGM showing worsening glycemic profile.  Her POCT A1c today is 8.5%, increasing from last visit of 7.1%.  Analysis of her CGM shows TIR 36%, TAR 64%, TBR 0% with a GMI of 8.5%.  She notes she has started a workout regimen but is frustrated that she actually gained weight instead of losing it.) An ACE inhibitor/angiotensin II receptor blocker is being taken. She does not see a podiatrist.Eye exam is current.  Hyperlipidemia This is a chronic problem. The current episode started more than 1 year ago. The problem is uncontrolled. Recent lipid tests were reviewed and are variable. Exacerbating diseases include chronic renal disease, diabetes and obesity. Factors aggravating her hyperlipidemia include fatty foods, beta blockers and thiazides. Pertinent negatives include no chest  pain, myalgias or shortness of breath. Current antihyperlipidemic treatment includes statins. The current treatment provides mild improvement of lipids. Compliance problems include adherence to diet.  Risk factors for coronary artery disease include diabetes mellitus, dyslipidemia, a sedentary lifestyle, post-menopausal, obesity and hypertension.  Hypertension This is a chronic problem. The current episode started more than 1 year ago. The problem has been resolved since onset. The problem is controlled. Pertinent negatives include no chest pain, headaches, palpitations or shortness of breath. Agents associated with hypertension include amphetamines. Risk factors for coronary artery disease include diabetes mellitus, dyslipidemia, post-menopausal state, sedentary lifestyle and smoking/tobacco exposure. Past treatments include angiotensin blockers, beta blockers and diuretics. The current treatment provides mild improvement. Compliance problems include diet and exercise.  Hypertensive end-organ damage includes kidney disease. Identifiable causes of hypertension include chronic renal disease.     Review of systems  Constitutional: + increasing body weight (Despite starting workout regimen),  current Body mass index is 37.44 kg/m. , no fatigue, no subjective hyperthermia, no subjective hypothermia Eyes: no blurry vision, no xerophthalmia ENT: no sore throat, no nodules palpated in throat, no dysphagia/odynophagia, no hoarseness Cardiovascular: no chest pain, no shortness of breath, no palpitations, no leg swelling Respiratory: no cough, no shortness of breath Gastrointestinal: no nausea/vomiting/diarrhea Musculoskeletal: no muscle/joint aches, + muscle weakness (history of MS) Skin: no rashes, no hyperemia, + hair loss from MS medication Neurological: no tremors, + numbness/tingling to BLE (esp bottom of feet), no dizziness Psychiatric: no depression, no anxiety    Objective:    BP 105/63 (BP  Location: Left Arm, Patient Position: Sitting, Cuff Size: Large)   Pulse 83   Ht 5\' 5"  (1.651 m)   Wt 225 lb (102.1 kg)   BMI 37.44 kg/m   Wt Readings from Last 3 Encounters:  10/10/22 225 lb (102.1 kg)  07/09/22 219 lb 12.8 oz (99.7 kg)  04/08/22 211 lb (95.7 kg)    BP Readings from Last 3 Encounters:  10/10/22 105/63  07/09/22 134/80  04/08/22 126/88      Physical Exam- Limited  Constitutional:  Body mass index is 37.44 kg/m. , not in acute distress, normal state of mind Eyes:  EOMI, no exophthalmos Neck: Supple Musculoskeletal: no gross deformities, strength intact in all four extremities, no gross restriction of joint movements Skin:  no rashes, no hyperemia Neurological: no tremor with outstretched hands  Diabetic Foot Exam - Simple   No data filed     Results for orders placed or performed in visit on 10/10/22  HgB A1c  Result Value Ref Range   Hemoglobin A1C 8.5 (A) 4.0 - 5.6 %   HbA1c POC (<> result, manual entry)     HbA1c, POC (prediabetic range)     HbA1c, POC (controlled diabetic range)     Lipid Panel     Component Value Date/Time   CHOL 143 07/25/2021 1646   CHOL 156 07/31/2020 0834   TRIG 237 (A) 07/08/2022 0000   HDL 37 07/08/2022 0000   HDL 33 (L) 07/31/2020 0834   CHOLHDL 4.7 (H) 07/31/2020 0834   CHOLHDL 4.2 06/17/2019 0818   VLDL 57 (H) 09/04/2016 1036   LDLCALC 113 07/08/2022 0000   LDLCALC 90 07/31/2020 0834   LDLCALC 90 06/17/2019 0818   Recent Results (from the past 2160 hour(s))  HgB A1c     Status: Abnormal   Collection Time: 10/10/22  3:15 PM  Result Value Ref Range   Hemoglobin A1C 8.5 (A) 4.0 - 5.6 %   HbA1c POC (<> result, manual entry)     HbA1c, POC (prediabetic range)     HbA1c, POC (controlled diabetic range)           Assessment & Plan:   1) Controlled type 2 diabetes mellitus without complication, with long-term current use of insulin (HCC)  She presents today with her CGM showing worsening glycemic  profile.  Her POCT A1c today is 8.5%, increasing from last visit of 7.1%.  Analysis of her CGM shows TIR 29%, TAR 71%, TBR 0% with a GMI of 8.3%.  She notes she has started a workout regimen but is frustrated that she actually gained weight instead of losing it.  She does have some drops in glucose for which she tends to over-correct.   Recent labs reviewed showing elevated liver function tests (following up with PCP about this).  - Patient remains at a high risk for more acute and chronic complications of diabetes which include CAD, CVA, CKD, retinopathy, and neuropathy. These are all discussed in detail with the patient.  - Nutritional counseling repeated at each appointment due to patients tendency to fall back in to old habits.  - The patient admits there is a room for improvement in their diet and drink choices. -  Suggestion is made for the patient to avoid simple carbohydrates from their diet including Cakes, Sweet Desserts / Pastries, Ice Cream, Soda (diet and regular), Sweet Tea, Candies, Chips, Cookies, Sweet Pastries, Store Bought Juices, Alcohol in Excess of 1-2 drinks a day, Artificial Sweeteners, Coffee Creamer, and "Sugar-free" Products. This will help patient to have stable blood glucose profile and potentially avoid unintended weight gain.   - I encouraged the patient to switch to unprocessed or minimally processed complex starch and increased protein intake (animal or plant source), fruits, and vegetables.   - Patient is advised to stick to a routine mealtimes to eat 3 meals a day and avoid unnecessary snacks (to snack only to correct hypoglycemia).  - I have approached patient with the following individualized plan to manage diabetes and patient agrees.  -She is advised to continue her Toujeo 80 units SQ nightly, continue her Humalog 22-28 units TID with meals if glucose is above 90 and she is eating (Specific instructions on how to titrate insulin dosage based on glucose readings  given to patient in writing), and Metformin 1000 mg ER  daily with breakfast.   Will increase her Januvia to 100 mg po daily and see if it helps bring down postprandial highs without having to increase insulin doses.  -She is encouraged to continue to monitor blood glucose at least 4 times a day (now using her CGM), before meals and at bedtime and report blood glucose readings less than 70 or greater than 200 for 3 tests in a row.    -She did not tolerate Ozempic.  - Patient specific target  for A1c; LDL, HDL, Triglycerides, and  Waist Circumference were discussed in detail.  2) BP/HTN:  Her blood pressure is controlled to target.  She is advised to continue Atenolol 50 mg p.o. twice daily and Losartan 50 mg p.o. daily.    3) Lipids/HPL: Her most recent lipid panel from 07/08/22 show uncontrolled LDL at 113 and elevated triglycerides of 237.  She is advised to continue Crestor 20 mg po daily at bedtime and Vascepa 1 g po BID (says she has been forgetting her second dose).  Side effects and precautions discussed with her.    4)  Weight/Diet:  Her Body mass index is 37.44 kg/m. -clearly complicating her diabetes management.  She is a candidate for modest weight loss.  CDE consult in progress, exercise, and carbohydrates information provided.  5) Chronic Care/Health Maintenance: -Patient is on ACE and Statin medications and is encouraged to continue to follow up with Ophthalmology, Podiatrist at least yearly or according to recommendations, and advised to  stay away from smoking. I have recommended yearly flu vaccine and pneumonia vaccination at least every 5 years; moderate intensity exercise for up to 150 minutes weekly; and  sleep for at least 7 hours a day.  I advised patient to maintain close follow up with her PCP for primary care needs and regarding her skin lesion.      I spent  41  minutes in the care of the patient today including review of labs from CMP, Lipids, Thyroid Function,  Hematology (current and previous including abstractions from other facilities); face-to-face time discussing  her blood glucose readings/logs, discussing hypoglycemia and hyperglycemia episodes and symptoms, medications doses, her options of short and long term treatment based on the latest standards of care / guidelines;  discussion about incorporating lifestyle medicine;  and documenting the encounter. Risk reduction counseling performed per USPSTF guidelines to reduce obesity and cardiovascular risk factors.     Please refer to Patient Instructions for Blood Glucose Monitoring and Insulin/Medications Dosing Guide"  in media tab for additional information. Please  also refer to " Patient Self Inventory" in the Media  tab for reviewed elements of pertinent patient history.  Alben Spittle Bolinger participated in the discussions, expressed understanding, and voiced agreement with the above plans.  All questions were answered to her satisfaction. she is encouraged to contact clinic should she have any questions or concerns prior to her return visit.     Follow up plan: Return in about 3 months (around 01/09/2023) for Diabetes F/U with A1c in office, No previsit labs, Bring meter and logs.   Ronny Bacon, [facility] [address] [place], [place] [address] Phone: [phone] Fax: [phone]  10/10/2022, 4:10 PM

## 2022-11-13 ENCOUNTER — Other Ambulatory Visit: Payer: Self-pay

## 2022-11-13 DIAGNOSIS — E1169 Type 2 diabetes mellitus with other specified complication: Secondary | ICD-10-CM

## 2022-11-13 MED ORDER — DEXCOM G7 SENSOR MISC
2 refills | Status: DC
Start: 2022-11-13 — End: 2022-11-19

## 2022-11-13 MED ORDER — DEXCOM G7 SENSOR MISC: 2 refills | Status: DC

## 2022-11-19 ENCOUNTER — Telehealth: Payer: Self-pay | Admitting: Nurse Practitioner

## 2022-11-19 DIAGNOSIS — E1169 Type 2 diabetes mellitus with other specified complication: Secondary | ICD-10-CM

## 2022-11-19 MED ORDER — DEXCOM G7 SENSOR MISC: 1 refills | Status: DC

## 2022-11-19 MED ORDER — DEXCOM G7 SENSOR MISC: 2 refills | Status: DC

## 2022-11-19 NOTE — Telephone Encounter (Signed)
Faxed manually

## 2022-11-19 NOTE — Telephone Encounter (Signed)
Can you re send in her sensors? It says it failed and did not go through thanks

## 2022-11-28 ENCOUNTER — Other Ambulatory Visit: Payer: Self-pay

## 2022-11-28 DIAGNOSIS — Z794 Long term (current) use of insulin: Secondary | ICD-10-CM

## 2022-11-28 MED ORDER — DEXCOM G7 SENSOR MISC: 1 refills | Status: DC

## 2022-11-28 NOTE — Telephone Encounter (Signed)
Pt still has gotten her sensors. She said to please call and give verbally [phone] Fax [phone]

## 2022-11-28 NOTE — Telephone Encounter (Signed)
Sent again and verified they received it.Pt notified.

## 2023-01-13 ENCOUNTER — Ambulatory Visit (INDEPENDENT_AMBULATORY_CARE_PROVIDER_SITE_OTHER): Payer: 59 | Admitting: Nurse Practitioner

## 2023-01-13 ENCOUNTER — Encounter: Payer: Self-pay | Admitting: Nurse Practitioner

## 2023-01-13 VITALS — BP 138/83 | HR 73 | Ht 65.0 in | Wt 203.6 lb

## 2023-01-13 DIAGNOSIS — I10 Essential (primary) hypertension: Secondary | ICD-10-CM | POA: Diagnosis not present

## 2023-01-13 DIAGNOSIS — E782 Mixed hyperlipidemia: Secondary | ICD-10-CM

## 2023-01-13 DIAGNOSIS — Z794 Long term (current) use of insulin: Secondary | ICD-10-CM

## 2023-01-13 DIAGNOSIS — E1169 Type 2 diabetes mellitus with other specified complication: Secondary | ICD-10-CM

## 2023-01-13 DIAGNOSIS — E559 Vitamin D deficiency, unspecified: Secondary | ICD-10-CM

## 2023-01-13 DIAGNOSIS — Z7984 Long term (current) use of oral hypoglycemic drugs: Secondary | ICD-10-CM

## 2023-01-13 DIAGNOSIS — E118 Type 2 diabetes mellitus with unspecified complications: Secondary | ICD-10-CM

## 2023-01-13 DIAGNOSIS — Z7985 Long-term (current) use of injectable non-insulin antidiabetic drugs: Secondary | ICD-10-CM

## 2023-01-13 LAB — POCT GLYCOSYLATED HEMOGLOBIN (HGB A1C): Hemoglobin A1C: 7.8 % — AB (ref 4.0–5.6)

## 2023-01-13 MED ORDER — TOUJEO MAX SOLOSTAR 300 UNIT/ML ~~LOC~~ SOPN: 40.0000 [IU] | PEN_INJECTOR | Freq: Every evening | SUBCUTANEOUS | Status: DC

## 2023-01-13 NOTE — Progress Notes (Signed)
01/13/2023   Endocrinology follow-up note    Subjective:    Patient ID: Melanie Schultz, female    DOB: October 23, 1955,  Melanie Schultz presents today for follow up of uncontrolled type 2 diabetes, hypertension, and hyperlipidemia.  Past Medical History:  Diagnosis Date   Anxiety    Depression    Diabetes mellitus without complication (HCC)    GERD (gastroesophageal reflux disease)    Hypertension    Liver fibrosis    Multiple sclerosis (HCC)    PONV (postoperative nausea and vomiting)    Sleep apnea    Past Surgical History:  Procedure Laterality Date   BREAST BIOPSY Left    BREAST EXCISIONAL BIOPSY Left    benign   CESAREAN SECTION     ENDOMETRIAL ABLATION     NASAL SEPTUM SURGERY     TRIGGER FINGER RELEASE Left 05/04/2021   Procedure: RELEASE TRIGGER FINGER/A-1 PULLEY;  Surgeon: Vickki Hearing, MD;  Location: [facility];  Service: Orthopedics;  Laterality: Left;   Social History   Socioeconomic History   Marital status: Divorced    Spouse name: Not on file   Number of children: Not on file   Years of education: Not on file   Highest education level: Not on file  Occupational History   Not on file  Tobacco Use   Smoking status: Former    Current packs/day: 0.00    Types: Cigarettes    Quit date: 02/04/1978    Years since quitting: 44.9   Smokeless tobacco: Never  Vaping Use   Vaping status: Never Used  Substance and Sexual Activity   Alcohol use: No    Alcohol/week: 0.0 standard drinks of alcohol   Drug use: No   Sexual activity: Not on file  Other Topics Concern   Not on file  Social History Narrative   Not on file   Social Determinants of Health   Financial Resource Strain: Not on file  Food Insecurity: Not on file  Transportation Needs: Not on file  Physical Activity: Not on file  Stress: Not on file  Social Connections: Not on file   Outpatient Encounter Medications as of 01/13/2023  Medication Sig   Accu-Chek Softclix Lancets lancets 100 each  by Other route 4 (four) times daily. Use as instructed   ARIPiprazole (ABILIFY) 2 MG tablet Take 2 mg by mouth in the morning.   Ascorbic Acid (VITAMIN C) 1000 MG tablet Take 1,000 mg by mouth in the morning.   atenolol (TENORMIN) 50 MG tablet Take 1 tablet (50 mg total) by mouth 2 (two) times daily.   betamethasone valerate ointment (VALISONE) 0.1 % Apply twice daily to affected areas as needed for itching. Avoid applying to face, groin, and axilla.   Cholecalciferol (VITAMIN D3) 125 MCG (5000 UT) CAPS Take 5,000 Units by mouth every evening.   Coenzyme Q10 (COQ-10) 100 MG CAPS Take 100 mg by mouth at bedtime.   Continuous Blood Gluc Receiver (DEXCOM G7 RECEIVER) DEVI See admin instructions.   Continuous Glucose Sensor (DEXCOM G7 SENSOR) MISC Change sensor every 10 days. E11.65   Cyanocobalamin (VITAMIN B-12) 5000 MCG TBDP Take 5,000 mcg by mouth in the morning.   Evening Primrose Oil 1000 MG CAPS Take 1,000 mg by mouth at bedtime.   fenofibrate 54 MG tablet Take 54 mg by mouth daily.   ferrous sulfate 325 (65 FE) MG tablet Take 325 mg by mouth at bedtime.   FLUoxetine (PROZAC) 40 MG capsule 1 capsule Orally Once  a day   glucose blood (ACCU-CHEK AVIVA PLUS) test strip TEST 4 TIMES DAILY E11.65   Homeopathic Products (LIVER SUPPORT SL) Take 2 tablets by mouth in the morning.   Insulin Pen Needle (RELION PEN NEEDLE 31G/8MM) 31G X 8 MM MISC USE WITH INSULIN QID. E11.65   irbesartan (AVAPRO) 300 MG tablet Take 300 mg by mouth every morning.   Krill Oil 300 MG CAPS Take 300 mg by mouth every evening.   loratadine (CLARITIN) 10 MG tablet Take 10 mg by mouth every evening.   magnesium oxide (MAG-OX) 400 MG tablet Take 400 mg by mouth at bedtime.   metFORMIN (GLUCOPHAGE-XR) 500 MG 24 hr tablet Take 2 tablets (1,000 mg total) by mouth daily with breakfast. TAKE 2 TABLETS BY MOUTH ONCE DAILY WITH BREAKFAST   methylphenidate (RITALIN) 20 MG tablet Take 20 mg by mouth 4 (four) times daily.   Milk Thistle  175 MG CAPS Take 175 mg by mouth in the morning.   Multiple Vitamin (MULTIVITAMIN WITH MINERALS) TABS tablet Take 1 tablet by mouth in the morning. Centrum   Multiple Vitamins-Minerals (ZINC PO) Take 1 tablet by mouth in the morning.   omeprazole (PRILOSEC) 20 MG capsule Take 20 mg by mouth every evening.   rosuvastatin (CRESTOR) 20 MG tablet Take 20 mg by mouth in the morning.   sitaGLIPtin (JANUVIA) 100 MG tablet Take 1 tablet (100 mg total) by mouth daily.   VASCEPA 1 g capsule 2 g 2 (two) times daily.   [DISCONTINUED] insulin glargine, 2 Unit Dial, (TOUJEO MAX SOLOSTAR) 300 UNIT/ML Solostar Pen Inject 80 Units into the skin at bedtime.   [DISCONTINUED] insulin lispro (HUMALOG KWIKPEN) 100 UNIT/ML KwikPen Inject 22-28 Units into the skin 3 (three) times daily with meals.   Cholecalciferol 125 MCG (5000 UT) capsule 1 capsule Orally once a day (Patient not taking: Reported on 01/13/2023)   insulin glargine, 2 Unit Dial, (TOUJEO MAX SOLOSTAR) 300 UNIT/ML Solostar Pen Inject 40 Units into the skin at bedtime.   losartan (COZAAR) 50 MG tablet Take 50 mg by mouth every evening. (Patient not taking: Reported on 10/10/2022)   Probiotic Product (PROBIOTIC PO) Take 1 capsule by mouth in the morning. (Patient not taking: Reported on 01/13/2023)   No facility-administered encounter medications on file as of 01/13/2023.   ALLERGIES: Allergies  Allergen Reactions   Lisinopril Cough    Other reaction(s): cough   Mango Flavoring Agent (Non-Screening) Rash    Mango fruit-rash   VACCINATION STATUS:  There is no immunization history on file for this patient.  Diabetes She presents for her follow-up diabetic visit. She has type 2 diabetes mellitus. Onset time: She was diagnosed at approximate age of 48 years. Her disease course has been improving. There are no hypoglycemic associated symptoms. Pertinent negatives for hypoglycemia include no confusion, headaches, pallor or seizures. Associated symptoms include  foot paresthesias and weight loss. Pertinent negatives for diabetes include no chest pain, no fatigue, no polydipsia, no polyphagia and no polyuria. There are no hypoglycemic complications. Symptoms are stable. Diabetic complications include nephropathy and peripheral neuropathy. Risk factors for coronary artery disease include dyslipidemia, hypertension, obesity, sedentary lifestyle and diabetes mellitus. Current diabetic treatment includes intensive insulin program and oral agent (dual therapy). She is compliant with treatment some of the time (often forgets her oral meds, glucose has been controlled so she has skipped several doses of insulin as well). Her weight is increasing steadily. She is following a generally healthy diet. When asked about meal  planning, she reported none. She has had a previous visit with a dietitian. She participates in exercise intermittently. Her home blood glucose trend is decreasing steadily. Her overall blood glucose range is 180-200 mg/dl. (She presents today with her CGM showing drastically improved glycemic profile.  Her POCT A1c today is 7.8%, improving from last visit of 8.5%.  Analysis of her CGM shows TIR 40%, TAR 60%, TBR 0% with a GMI of 8%.  She has lost 22 lbs since last visit.  She notes she often forgets to take her oral medications and has been skipping doses of her insulin depending on her glucose readings.) An ACE inhibitor/angiotensin II receptor blocker is being taken. She does not see a podiatrist.Eye exam is current.  Hyperlipidemia This is a chronic problem. The current episode started more than 1 year ago. The problem is uncontrolled. Recent lipid tests were reviewed and are variable. Exacerbating diseases include chronic renal disease, diabetes and obesity. Factors aggravating her hyperlipidemia include fatty foods, beta blockers and thiazides. Pertinent negatives include no chest pain, myalgias or shortness of breath. Current antihyperlipidemic treatment  includes statins. The current treatment provides mild improvement of lipids. Compliance problems include adherence to diet.  Risk factors for coronary artery disease include diabetes mellitus, dyslipidemia, a sedentary lifestyle, post-menopausal, obesity and hypertension.  Hypertension This is a chronic problem. The current episode started more than 1 year ago. The problem has been resolved since onset. The problem is controlled. Pertinent negatives include no chest pain, headaches, palpitations or shortness of breath. Agents associated with hypertension include amphetamines. Risk factors for coronary artery disease include diabetes mellitus, dyslipidemia, post-menopausal state, sedentary lifestyle and smoking/tobacco exposure. Past treatments include angiotensin blockers, beta blockers and diuretics. The current treatment provides mild improvement. Compliance problems include diet and exercise.  Hypertensive end-organ damage includes kidney disease. Identifiable causes of hypertension include chronic renal disease.     Review of systems  Constitutional: + decreasing body weight,  current Body mass index is 33.88 kg/m. , no fatigue, no subjective hyperthermia, no subjective hypothermia Eyes: no blurry vision, no xerophthalmia ENT: no sore throat, no nodules palpated in throat, no dysphagia/odynophagia, no hoarseness Cardiovascular: no chest pain, no shortness of breath, no palpitations, no leg swelling Respiratory: no cough, no shortness of breath Gastrointestinal: no nausea/vomiting/diarrhea Musculoskeletal: no muscle/joint aches, + muscle weakness (history of MS) Skin: no rashes, no hyperemia, + hair loss from MS medication Neurological: no tremors, + numbness/tingling to BLE (esp bottom of feet), no dizziness Psychiatric: no depression, no anxiety    Objective:    BP 138/83 (BP Location: Right Arm, Patient Position: Sitting, Cuff Size: Large)   Pulse 73   Ht 5\' 5"  (1.651 m)   Wt 203 lb  9.6 oz (92.4 kg)   BMI 33.88 kg/m   Wt Readings from Last 3 Encounters:  01/13/23 203 lb 9.6 oz (92.4 kg)  10/10/22 225 lb (102.1 kg)  07/09/22 219 lb 12.8 oz (99.7 kg)    BP Readings from Last 3 Encounters:  01/13/23 138/83  10/10/22 105/63  07/09/22 134/80     Physical Exam- Limited  Constitutional:  Body mass index is 33.88 kg/m. , not in acute distress, normal state of mind Eyes:  EOMI, no exophthalmos Musculoskeletal: no gross deformities, strength intact in all four extremities, no gross restriction of joint movements Skin:  no rashes, no hyperemia Neurological: no tremor with outstretched hands   Diabetic Foot Exam - Simple   No data filed     Results  for orders placed or performed in visit on 01/13/23  HgB A1c  Result Value Ref Range   Hemoglobin A1C 7.8 (A) 4.0 - 5.6 %   HbA1c POC (<> result, manual entry)     HbA1c, POC (prediabetic range)     HbA1c, POC (controlled diabetic range)     Lipid Panel     Component Value Date/Time   CHOL 143 07/25/2021 1646   CHOL 156 07/31/2020 0834   TRIG 237 (A) 07/08/2022 0000   HDL 37 07/08/2022 0000   HDL 33 (L) 07/31/2020 0834   CHOLHDL 4.7 (H) 07/31/2020 0834   CHOLHDL 4.2 06/17/2019 0818   VLDL 57 (H) 09/04/2016 1036   LDLCALC 113 07/08/2022 0000   LDLCALC 90 07/31/2020 0834   LDLCALC 90 06/17/2019 0818   Recent Results (from the past 2160 hour(s))  HgB A1c     Status: Abnormal   Collection Time: 01/13/23  2:26 PM  Result Value Ref Range   Hemoglobin A1C 7.8 (A) 4.0 - 5.6 %   HbA1c POC (<> result, manual entry)     HbA1c, POC (prediabetic range)     HbA1c, POC (controlled diabetic range)            Assessment & Plan:   1) Controlled type 2 diabetes mellitus without complication, with long-term current use of insulin (HCC)  She presents today with her CGM showing drastically improved glycemic profile.  Her POCT A1c today is 7.8%, improving from last visit of 8.5%.  Analysis of her CGM shows TIR  40%, TAR 60%, TBR 0% with a GMI of 8%.  She has lost 22 lbs since last visit.  She notes she often forgets to take her oral medications and has been skipping doses of her insulin depending on her glucose readings.   Recent labs reviewed showing elevated liver function tests (following up with PCP about this).  - Patient remains at a high risk for more acute and chronic complications of diabetes which include CAD, CVA, CKD, retinopathy, and neuropathy. These are all discussed in detail with the patient.  - Nutritional counseling repeated at each appointment due to patients tendency to fall back in to old habits.  - The patient admits there is a room for improvement in their diet and drink choices. -  Suggestion is made for the patient to avoid simple carbohydrates from their diet including Cakes, Sweet Desserts / Pastries, Ice Cream, Soda (diet and regular), Sweet Tea, Candies, Chips, Cookies, Sweet Pastries, Store Bought Juices, Alcohol in Excess of 1-2 drinks a day, Artificial Sweeteners, Coffee Creamer, and "Sugar-free" Products. This will help patient to have stable blood glucose profile and potentially avoid unintended weight gain.   - I encouraged the patient to switch to unprocessed or minimally processed complex starch and increased protein intake (animal or plant source), fruits, and vegetables.   - Patient is advised to stick to a routine mealtimes to eat 3 meals a day and avoid unnecessary snacks (to snack only to correct hypoglycemia).  - I have approached patient with the following individualized plan to manage diabetes and patient agrees.  -She is advised to lower her her Toujeo to 40 units SQ nightly and stop her prandial insulin altogether today.  She is advised to be more consistent with taking her Metformin 1000 mg ER daily and Januvia 100 mg po daily.  -She is encouraged to continue to monitor blood glucose at least 4 times a day (using her CGM), before meals and at bedtime  and  report blood glucose readings less than 70 or greater than 200 for 3 tests in a row.    -She did not tolerate Ozempic.  - Patient specific target  for A1c; LDL, HDL, Triglycerides, and  Waist Circumference were discussed in detail.  2) BP/HTN:  Her blood pressure is controlled to target.  She is advised to continue Atenolol 50 mg p.o. twice daily and Losartan 50 mg p.o. daily.    3) Lipids/HPL: Her most recent lipid panel from 07/08/22 show uncontrolled LDL at 113 and elevated triglycerides of 237.  She is advised to continue Crestor 20 mg po daily at bedtime and Vascepa 1 g po BID (says she has been forgetting her second dose).  Side effects and precautions discussed with her.    4)  Weight/Diet:  Her Body mass index is 33.88 kg/m. -clearly complicating her diabetes management.  She is a candidate for modest weight loss.  CDE consult in progress, exercise, and carbohydrates information provided.  5) Chronic Care/Health Maintenance: -Patient is on ACE and Statin medications and is encouraged to continue to follow up with Ophthalmology, Podiatrist at least yearly or according to recommendations, and advised to  stay away from smoking. I have recommended yearly flu vaccine and pneumonia vaccination at least every 5 years; moderate intensity exercise for up to 150 minutes weekly; and  sleep for at least 7 hours a day.  I advised patient to maintain close follow up with her PCP for primary care needs and regarding her skin lesion.     I spent  35  minutes in the care of the patient today including review of labs from CMP, Lipids, Thyroid Function, Hematology (current and previous including abstractions from other facilities); face-to-face time discussing  her blood glucose readings/logs, discussing hypoglycemia and hyperglycemia episodes and symptoms, medications doses, her options of short and long term treatment based on the latest standards of care / guidelines;  discussion about incorporating  lifestyle medicine;  and documenting the encounter. Risk reduction counseling performed per USPSTF guidelines to reduce obesity and cardiovascular risk factors.     Please refer to Patient Instructions for Blood Glucose Monitoring and Insulin/Medications Dosing Guide"  in media tab for additional information. Please  also refer to " Patient Self Inventory" in the Media  tab for reviewed elements of pertinent patient history.  Alben Spittle Debose participated in the discussions, expressed understanding, and voiced agreement with the above plans.  All questions were answered to her satisfaction. she is encouraged to contact clinic should she have any questions or concerns prior to her return visit.     Follow up plan: Return in about 4 months (around 05/14/2023) for Diabetes F/U with A1c in office, No previsit labs, Bring meter and logs.   Ronny Bacon, [facility] [address] [place], [place] [address] Phone: [phone] Fax: [phone]  01/13/2023, 2:38 PM

## 2023-02-22 ENCOUNTER — Ambulatory Visit
Admission: EM | Admit: 2023-02-22 | Discharge: 2023-02-22 | Disposition: A | Discharge status: Home / Self Care | Payer: 59 | Attending: Nurse Practitioner | Admitting: Nurse Practitioner

## 2023-02-22 DIAGNOSIS — L608 Other nail disorders: Secondary | ICD-10-CM | POA: Diagnosis not present

## 2023-02-22 DIAGNOSIS — L600 Ingrowing nail: Secondary | ICD-10-CM | POA: Diagnosis not present

## 2023-02-22 NOTE — Discharge Instructions (Addendum)
Patient declined AV[facility] 

## 2023-02-22 NOTE — ED Provider Notes (Signed)
[facility]    CSN: [id number] Arrival date & time: 02/22/23  1002      History   Chief Complaint No chief complaint on file.   HPI Melanie Schultz is a 68 y.o. female.   The history is provided by the patient.   Patient presents for complaints of a hangnail on her left small toe.  She states she now has pain in the left small toe over the past 4 to 5 days.  States that she cuts her own toenails.  She is concerned for infection as she is diabetic.  Reports that she has never visited a podiatrist for her feet.  She denies fever, chills, chest pain, abdominal pain, nausea, vomiting, diarrhea, or rash.  Past Medical History:  Diagnosis Date   Anxiety    Depression    Diabetes mellitus without complication (HCC)    GERD (gastroesophageal reflux disease)    Hypertension    Liver fibrosis    Multiple sclerosis (HCC)    PONV (postoperative nausea and vomiting)    Sleep apnea     Patient Active Problem List   Diagnosis Date Noted   Other seborrheic keratosis 03/05/2021   Dizziness and giddiness 12/26/2020   Amnesia 01/31/2020   Anogenital warts 01/31/2020   Attention deficit hyperactivity disorder 01/31/2020   Body mass index (BMI) 33.0-33.9, adult 01/31/2020   Carbuncle 01/31/2020   Costochondritis 01/31/2020   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   Derangement of posterior horn of medial meniscus 01/31/2020   Diabetic peripheral neuropathy associated with type 2 diabetes mellitus (HCC) 01/31/2020   Fatty liver 01/31/2020   Gastroesophageal reflux disease 01/31/2020   Long term (current) use of insulin (HCC) 01/31/2020   Low back pain 01/31/2020   Mood disorder (HCC) 01/31/2020   Multiple sclerosis (HCC) 01/31/2020   Neurogenic bladder 01/31/2020   Orthopnea 01/31/2020   Other abnormal and inconclusive findings on diagnostic imaging of breast 01/31/2020   Papillomavirus as the cause of diseases classified elsewhere 01/31/2020   Sciatica 01/31/2020    Solitary pulmonary nodule 01/31/2020   Trigger finger, left middle finger 01/31/2020   Vitamin D deficiency 01/31/2020   Sleep apnea 11/25/2019   Fatigue 09/06/2019   Impaired cognition 04/07/2019   Abnormal liver function tests 08/28/2018   Type 2 diabetes mellitus (HCC) 11/24/2014   Benign essential hypertension 11/24/2014   Hyperlipidemia 11/24/2014    Past Surgical History:  Procedure Laterality Date   BREAST BIOPSY Left    BREAST EXCISIONAL BIOPSY Left    benign   CESAREAN SECTION     ENDOMETRIAL ABLATION     NASAL SEPTUM SURGERY     TRIGGER FINGER RELEASE Left 05/04/2021   Procedure: RELEASE TRIGGER FINGER/A-1 PULLEY;  Surgeon: Vickki Hearing, MD;  Location: [facility];  Service: Orthopedics;  Laterality: Left;    OB History   No obstetric history on file.      Home Medications    Prior to Admission medications   Medication Sig Start Date End Date Taking? Authorizing Provider  Accu-Chek Softclix Lancets lancets 100 each by Other route 4 (four) times daily. Use as instructed 09/17/18   Roma Kayser, MD  ARIPiprazole (ABILIFY) 2 MG tablet Take 2 mg by mouth in the morning. 09/15/19   [provider]  Ascorbic Acid (VITAMIN C) 1000 MG tablet Take 1,000 mg by mouth in the morning.    [provider]  atenolol (TENORMIN) 50 MG tablet Take 1 tablet (50 mg total) by mouth 2 (two)  times daily. 09/18/18   Roma Kayser, MD  betamethasone valerate ointment (VALISONE) 0.1 % Apply twice daily to affected areas as needed for itching. Avoid applying to face, groin, and axilla. 02/19/22   Willeen Niece, MD  Cholecalciferol (VITAMIN D3) 125 MCG (5000 UT) CAPS Take 5,000 Units by mouth every evening.    [provider]  Coenzyme Q10 (COQ-10) 100 MG CAPS Take 100 mg by mouth at bedtime.    [provider]  Continuous Blood Gluc Receiver (DEXCOM G7 RECEIVER) DEVI See admin instructions.    [provider]  Continuous Glucose  Sensor (DEXCOM G7 SENSOR) MISC Change sensor every 10 days. E11.65 11/28/22   Dani Gobble, NP  Cyanocobalamin (VITAMIN B-12) 5000 MCG TBDP Take 5,000 mcg by mouth in the morning.    [provider]  Evening Primrose Oil 1000 MG CAPS Take 1,000 mg by mouth at bedtime.    [provider]  fenofibrate 54 MG tablet Take 54 mg by mouth daily. 07/27/21   [provider]  ferrous sulfate 325 (65 FE) MG tablet Take 325 mg by mouth at bedtime.    [provider]  FLUoxetine (PROZAC) 40 MG capsule 1 capsule Orally Once a day    [provider]  glucose blood (ACCU-CHEK AVIVA PLUS) test strip TEST 4 TIMES DAILY E11.65 10/10/21   Dani Gobble, NP  Homeopathic Products (LIVER SUPPORT SL) Take 2 tablets by mouth in the morning.    [provider]  insulin glargine, 2 Unit Dial, (TOUJEO MAX SOLOSTAR) 300 UNIT/ML Solostar Pen Inject 40 Units into the skin at bedtime. 01/13/23   Dani Gobble, NP  Insulin Pen Needle (RELION PEN NEEDLE 31G/8MM) 31G X 8 MM MISC USE WITH INSULIN QID. E11.65 10/10/22   Dani Gobble, NP  irbesartan (AVAPRO) 300 MG tablet Take 300 mg by mouth every morning. 12/06/22   [provider]  Boris Lown Oil 300 MG CAPS Take 300 mg by mouth every evening.    [provider]  loratadine (CLARITIN) 10 MG tablet Take 10 mg by mouth every evening.    [provider]  magnesium oxide (MAG-OX) 400 MG tablet Take 400 mg by mouth at bedtime.    [provider]  metFORMIN (GLUCOPHAGE-XR) 500 MG 24 hr tablet Take 2 tablets (1,000 mg total) by mouth daily with breakfast. TAKE 2 TABLETS BY MOUTH ONCE DAILY WITH BREAKFAST 10/10/22   Dani Gobble, NP  methylphenidate (RITALIN) 20 MG tablet Take 20 mg by mouth 4 (four) times daily. 03/29/21   [provider]  Milk Thistle 175 MG CAPS Take 175 mg by mouth in the morning.    [provider]  Multiple Vitamin (MULTIVITAMIN WITH MINERALS) TABS  tablet Take 1 tablet by mouth in the morning. Centrum    [provider]  Multiple Vitamins-Minerals (ZINC PO) Take 1 tablet by mouth in the morning.    [provider]  omeprazole (PRILOSEC) 20 MG capsule Take 20 mg by mouth every evening. 06/13/20   [provider]  rosuvastatin (CRESTOR) 20 MG tablet Take 20 mg by mouth in the morning. 05/07/18   [provider]  sitaGLIPtin (JANUVIA) 100 MG tablet Take 1 tablet (100 mg total) by mouth daily. 10/10/22   Dani Gobble, NP  VASCEPA 1 g capsule 2 g 2 (two) times daily.    [provider]    Family History Family History  Problem Relation Age of Onset  Hypertension Mother    Cancer Mother     Social History Social History   Tobacco Use   Smoking status: Former    Current packs/day: 0.00    Types: Cigarettes    Quit date: 02/04/1978    Years since quitting: 45.0   Smokeless tobacco: Never  Vaping Use   Vaping status: Never Used  Substance Use Topics   Alcohol use: No    Alcohol/week: 0.0 standard drinks of alcohol   Drug use: No     Allergies   Lisinopril and Mango flavoring agent (non-screening)   Review of Systems Review of Systems Per HPI  Physical Exam Triage Vital Signs ED Triage Vitals  Encounter Vitals Group     BP 02/22/23 1030 132/83     Systolic BP Percentile --      Diastolic BP Percentile --      Pulse Rate 02/22/23 1030 69     Resp 02/22/23 1030 18     Temp 02/22/23 1030 98.1 F (36.7 C)     Temp Source 02/22/23 1030 Oral     SpO2 02/22/23 1030 98 %     Weight --      Height --      Head Circumference --      Peak Flow --      Pain Score 02/22/23 1029 4     Pain Loc --      Pain Education --      Exclude from Growth Chart --    No data found.  Updated Vital Signs BP 132/83 (BP Location: Right Arm)   Pulse 69   Temp 98.1 F (36.7 C) (Oral)   Resp 18   SpO2 98%   Visual Acuity Right Eye Distance:   Left Eye Distance:   Bilateral  Distance:    Right Eye Near:   Left Eye Near:    Bilateral Near:     Physical Exam Vitals and nursing note reviewed.  Constitutional:      General: She is not in acute distress.    Appearance: Normal appearance.  Eyes:     Extraocular Movements: Extraocular movements intact.     Pupils: Pupils are equal, round, and reactive to light.  Pulmonary:     Effort: Pulmonary effort is normal.  Feet:     Left foot:     Skin integrity: No ulcer, blister, skin breakdown, erythema or warmth.     Comments: Small hangnail noted to the left fifth toe.  Toe is without swelling, erythema, or bruising. Skin:    General: Skin is warm and dry.  Neurological:     General: No focal deficit present.     Mental Status: She is alert and oriented to person, place, and time.  Psychiatric:        Mood and Affect: Mood normal.        Behavior: Behavior normal.      UC Treatments / Results  Labs (all labs ordered are listed, but only abnormal results are displayed) Labs Reviewed - No data to display  EKG   Radiology No results found.  Procedures Nail Removal  Date/Time: 02/22/2023 11:21 AM  Performed by: Abran Cantor, NP Authorized by: Abran Cantor, NP   Consent:    Consent obtained:  Verbal   Consent given by:  Patient Universal protocol:    Procedure explained and questions answered to patient or proxy's satisfaction: yes     Patient identity confirmed:  Verbally with patient Pre-procedure  details:    Skin preparation:  Alcohol Procedure details:    Location:  Foot Anesthesia:    Anesthesia method:  None Nail Removal:    Nail removed:  Partial Post-procedure details:    Procedure completion:  Tolerated Comments:     Small hangnail noted to the left small toe.  Area cleansed with alcohol, hangnail was removed with nail clippers.  No bleeding, oozing, drainage, or tenderness postprocedure.  Patient tolerated well.  (including critical care  time)  Medications Ordered in UC Medications - No data to display  Initial Impression / Assessment and Plan / UC Course  I have reviewed the triage vital signs and the nursing notes.  Pertinent labs & imaging results that were available during my care of the patient were reviewed by me and considered in my medical decision making (see chart for details).  Patient with hangnail to the left small toe.  Nail was removed with fingernail clippers.  Patient tolerated well.  Patient advised to continue to monitor area for worsening.  Patient was in agreement this plan of care and verbalized understanding.  All questions were answered.  Patient stable for discharge.   Final Clinical Impressions(s) / UC Diagnoses   Final diagnoses:  None   Discharge Instructions   None    ED Prescriptions   None    PDMP not reviewed this encounter.   Abran Cantor, NP 02/22/23 1123

## 2023-02-22 NOTE — ED Triage Notes (Signed)
Pt reports he left toe nail had a hang nail and now she has pain in her left pinky toe x 4-5 days

## 2023-02-23 ENCOUNTER — Ambulatory Visit: Payer: Self-pay

## 2023-03-04 ENCOUNTER — Ambulatory Visit: Payer: 59 | Admitting: Dermatology

## 2023-03-04 ENCOUNTER — Encounter: Payer: Self-pay | Admitting: Dermatology

## 2023-03-04 VITALS — BP 142/78 | HR 80

## 2023-03-04 DIAGNOSIS — D1801 Hemangioma of skin and subcutaneous tissue: Secondary | ICD-10-CM

## 2023-03-04 DIAGNOSIS — L814 Other melanin hyperpigmentation: Secondary | ICD-10-CM

## 2023-03-04 DIAGNOSIS — W908XXA Exposure to other nonionizing radiation, initial encounter: Secondary | ICD-10-CM

## 2023-03-04 DIAGNOSIS — Z1283 Encounter for screening for malignant neoplasm of skin: Secondary | ICD-10-CM

## 2023-03-04 DIAGNOSIS — D229 Melanocytic nevi, unspecified: Secondary | ICD-10-CM

## 2023-03-04 DIAGNOSIS — C4442 Squamous cell carcinoma of skin of scalp and neck: Secondary | ICD-10-CM | POA: Diagnosis not present

## 2023-03-04 DIAGNOSIS — L649 Androgenic alopecia, unspecified: Secondary | ICD-10-CM

## 2023-03-04 DIAGNOSIS — L821 Other seborrheic keratosis: Secondary | ICD-10-CM

## 2023-03-04 DIAGNOSIS — D225 Melanocytic nevi of trunk: Secondary | ICD-10-CM

## 2023-03-04 DIAGNOSIS — D492 Neoplasm of unspecified behavior of bone, soft tissue, and skin: Secondary | ICD-10-CM | POA: Diagnosis not present

## 2023-03-04 DIAGNOSIS — L578 Other skin changes due to chronic exposure to nonionizing radiation: Secondary | ICD-10-CM

## 2023-03-04 DIAGNOSIS — D485 Neoplasm of uncertain behavior of skin: Secondary | ICD-10-CM

## 2023-03-04 DIAGNOSIS — L565 Disseminated superficial actinic porokeratosis (DSAP): Secondary | ICD-10-CM

## 2023-03-04 DIAGNOSIS — L729 Follicular cyst of the skin and subcutaneous tissue, unspecified: Secondary | ICD-10-CM

## 2023-03-04 DIAGNOSIS — C4492 Squamous cell carcinoma of skin, unspecified: Secondary | ICD-10-CM

## 2023-03-04 DIAGNOSIS — L720 Epidermal cyst: Secondary | ICD-10-CM

## 2023-03-04 HISTORY — DX: Squamous cell carcinoma of skin, unspecified: C44.92

## 2023-03-04 MED ORDER — MINOXIDIL 2.5 MG PO TABS: 2.5000 mg | ORAL_TABLET | Freq: Every day | ORAL | 5 refills | Status: DC

## 2023-03-04 MED ORDER — HYDROQUINONE 4 % EX CREA: TOPICAL_CREAM | CUTANEOUS | 1 refills | Status: AC

## 2023-03-04 NOTE — Patient Instructions (Addendum)
Start minoxidil 2.5 mg take 1/2 tablet by mouth daily for one month. If tolerating ok, may increase to full tablet.   Doses of minoxidil for hair loss are considered 'low dose'. This is because the doses used for hair loss are much lower than the doses which are used for conditions such as high blood pressure (hypertension). The doses used for hypertension are 10-40mg  per day.  Side effects are uncommon at the low doses (up to 2.5 mg/day) used to treat hair loss. Potential side effects, more commonly seen at higher doses, include: Increase in hair growth (hypertrichosis) elsewhere on face and body Temporary hair shedding upon starting medication which may last up to 4 weeks Ankle swelling, fluid retention, rapid weight gain more than 5 pounds Low blood pressure and feeling lightheaded or dizzy when standing up quickly Fast or irregular heartbeat Headaches    Wound Care Instructions  Cleanse wound gently with soap and water once a day then pat dry with clean gauze. Apply a thin coat of Petrolatum (petroleum jelly, "Vaseline") over the wound (unless you have an allergy to this). We recommend that you use a new, sterile tube of Vaseline. Do not pick or remove scabs. Do not remove the yellow or white "healing tissue" from the base of the wound.  Cover the wound with fresh, clean, nonstick gauze and secure with paper tape. You may use Band-Aids in place of gauze and tape if the wound is small enough, but would recommend trimming much of the tape off as there is often too much. Sometimes Band-Aids can irritate the skin.  You should call the office for your biopsy report after 1 week if you have not already been contacted.  If you experience any problems, such as abnormal amounts of bleeding, swelling, significant bruising, significant pain, or evidence of infection, please call the office immediately.  FOR ADULT SURGERY PATIENTS: If you need something for pain relief you may take 1 extra strength  Tylenol (acetaminophen) AND 2 Ibuprofen (200mg  each) together every 4 hours as needed for pain. (do not take these if you are allergic to them or if you have a reason you should not take them.) Typically, you may only need pain medication for 1 to 3 days.     Due to recent changes in healthcare laws, you may see results of your pathology and/or laboratory studies on MyChart before the doctors have had a chance to review them. We understand that in some cases there may be results that are confusing or concerning to you. Please understand that not all results are received at the same time and often the doctors may need to interpret multiple results in order to provide you with the best plan of care or course of treatment. Therefore, we ask that you please give [place] 2 business days to thoroughly review all your results before contacting the office for clarification. Should we see a critical lab result, you will be contacted sooner.   If You Need Anything After Your Visit  If you have any questions or concerns for your doctor, please call our main line at [phone] and press option 4 to reach your doctor's medical assistant. If no one answers, please leave a voicemail as directed and we will return your call as soon as possible. Messages left after 4 pm will be answered the following business day.   You may also send [place] a message via MyChart. We typically respond to MyChart messages within 1-2 business days.  For prescription  refills, please ask your pharmacy to contact our office. Our fax number is [phone].  If you have an urgent issue when the clinic is closed that cannot wait until the next business day, you can page your doctor at the number below.    Please note that while we do our best to be available for urgent issues outside of office hours, we are not available 24/7.   If you have an urgent issue and are unable to reach [place], you may choose to seek medical care at your doctor's office,  retail clinic, urgent care center, or emergency room.  If you have a medical emergency, please immediately call 911 or go to the emergency department.  Pager Numbers  - Dr. Gwen Pounds: [phone]  - Dr. Roseanne Reno: [phone]  - Dr. Katrinka Blazing: [phone]   In the event of inclement weather, please call our main line at [phone] for an update on the status of any delays or closures.  Dermatology Medication Tips: Please keep the boxes that topical medications come in in order to help keep track of the instructions about where and how to use these. Pharmacies typically print the medication instructions only on the boxes and not directly on the medication tubes.   If your medication is too expensive, please contact our office at [phone] option 4 or send [place] a message through MyChart.   We are unable to tell what your co-pay for medications will be in advance as this is different depending on your insurance coverage. However, we may be able to find a substitute medication at lower cost or fill out paperwork to get insurance to cover a needed medication.   If a prior authorization is required to get your medication covered by your insurance company, please allow [place] 1-2 business days to complete this process.  Drug prices often vary depending on where the prescription is filled and some pharmacies may offer cheaper prices.  The website www.goodrx.com contains coupons for medications through different pharmacies. The prices here do not account for what the cost may be with help from insurance (it may be cheaper with your insurance), but the website can give you the price if you did not use any insurance.  - You can print the associated coupon and take it with your prescription to the pharmacy.  - You may also stop by our office during regular business hours and pick up a GoodRx coupon card.  - If you need your prescription sent electronically to a different pharmacy, notify our office  through [facility] or by phone at [phone] option 4.     Si Usted Necesita Algo Despus de Su Visita  Tambin puede enviarnos un mensaje a travs de Clinical cytogeneticist. Por lo general respondemos a los mensajes de MyChart en el transcurso de 1 a 2 das hbiles.  Para renovar recetas, por favor pida a su farmacia que se ponga en contacto con nuestra oficina. Annie Sable de fax es [place] [phone].  Si tiene un asunto urgente cuando la clnica est cerrada y que no puede esperar hasta el siguiente da hbil, puede llamar/localizar a su doctor(a) al nmero que aparece a continuacin.   Por favor, tenga en cuenta que aunque hacemos todo lo posible para estar disponibles para asuntos urgentes fuera del horario de [place], no estamos disponibles las 24 horas del da, los 7 [address]  [address] de la [place].   Si tiene un problema urgente y no puede comunicarse con nosotros, puede optar por buscar atencin mdica  en el  consultorio de su doctor(a), en una clnica privada, en un centro de atencin urgente o en una sala de emergencias.  Si tiene Engineer, drilling, por favor llame inmediatamente al 911 o vaya a la sala de emergencias.  Nmeros de bper  - Dr. Gwen Pounds: [phone]  - Dra. Roseanne Reno: [phone]  - Dr. Katrinka Blazing: [phone]   En caso de inclemencias del tiempo, por favor llame a Lacy Duverney principal al [phone] para una actualizacin sobre el [place] de cualquier retraso o cierre.  Consejos para la medicacin en dermatologa: Por favor, guarde las cajas en las que vienen los medicamentos de uso tpico para ayudarle a seguir las instrucciones sobre dnde y cmo usarlos. Las farmacias generalmente imprimen las instrucciones del medicamento slo en las cajas y no directamente en los tubos del [place].   Si su medicamento es muy caro, por favor, pngase en contacto con Rolm Gala llamando al [phone] y presione la opcin 4 o envenos un mensaje a travs de Clinical cytogeneticist.   No  podemos decirle cul ser su copago por los medicamentos por adelantado ya que esto es diferente dependiendo de la cobertura de su seguro. Sin embargo, es posible que podamos encontrar un medicamento sustituto a Audiological scientist un formulario para que el seguro cubra el medicamento que se considera necesario.   Si se requiere una autorizacin previa para que su compaa de seguros [place] su medicamento, por favor permtanos de 1 a 2 das hbiles para completar [address]  Los precios de los medicamentos varan con frecuencia dependiendo del Environmental consultant de dnde se surte la receta y alguna farmacias pueden ofrecer precios ms baratos.  El sitio web www.goodrx.com tiene cupones para medicamentos de Health and safety inspector. Los precios aqu no tienen en cuenta lo que podra costar con la ayuda del seguro (puede ser ms barato con su seguro), pero el sitio web puede darle el precio si no utiliz Tourist information centre manager.  - Puede imprimir el cupn correspondiente y llevarlo con su receta a la farmacia.  - Tambin puede pasar por nuestra oficina durante el horario de atencin regular y Education officer, museum una tarjeta de cupones de GoodRx.  - Si necesita que su receta se enve electrnicamente a una farmacia diferente, informe a nuestra oficina a travs de MyChart de [place] o por telfono llamando al [phone] y presione la opcin 4.

## 2023-03-04 NOTE — Progress Notes (Unsigned)
Follow-Up Visit   Subjective  Melanie Schultz is a 68 y.o. female who presents for the following: Skin Cancer Screening and Full Body Skin Exam  The patient presents for Total-Body Skin Exam (TBSE) for skin cancer screening and mole check. The patient has spots, moles and lesions to be evaluated, some may be new or changing. She has dark spots on her face to check. History of AKs. She has a tender growth on her posterior neck that came up 2 months ago. She also c/o hair loss.   The following portions of the chart were reviewed this encounter and updated as appropriate: medications, allergies, medical history  Review of Systems:  No other skin or systemic complaints except as noted in HPI or Assessment and Plan.  Objective  Well appearing patient in no apparent distress; mood and affect are within normal limits.  A full examination was performed including scalp, head, eyes, ears, nose, lips, neck, chest, axillae, abdomen, back, buttocks, bilateral upper extremities, bilateral lower extremities, hands, feet, fingers, toes, fingernails, and toenails. All findings within normal limits unless otherwise noted below.   Relevant physical exam findings are noted in the Assessment and Plan.  L post neck 6.0 mm pink scaly papule         Assessment & Plan   SKIN CANCER SCREENING PERFORMED TODAY.  ACTINIC DAMAGE - Chronic condition, secondary to cumulative UV/sun exposure - diffuse scaly erythematous macules with underlying dyspigmentation - Recommend daily broad spectrum sunscreen SPF 30+ to sun-exposed areas, reapply every 2 hours as needed.  - Staying in the shade or wearing long sleeves, sun glasses (UVA+UVB protection) and wide brim hats (4-inch brim around the entire circumference of the hat) are also recommended for sun protection.  - Call for new or changing lesions.  LENTIGINES, HEMANGIOMAS - Benign normal skin lesions - Benign-appearing - Call for any changes  MELANOCYTIC  NEVI - Tan-brown and/or pink-flesh-colored symmetric macules and papules - central upper abdomen 9mm brown macule - Benign appearing on exam today - Observation - Call clinic for new or changing moles - Recommend daily use of broad spectrum spf 30+ sunscreen to sun-exposed areas.   SEBORRHEIC KERATOSIS - Stuck-on, waxy, tan-brown papules and/or plaques  - Benign-appearing - Discussed benign etiology and prognosis. - Observe - Call for any changes  EPIDERMAL INCLUSION CYST Exam: Light pink firm papule at left anterior thigh  Benign-appearing. Exam most consistent with an epidermal inclusion cyst. Discussed that a cyst is a benign growth that can grow over time and sometimes get irritated or inflamed. Recommend observation if it is not bothersome. Discussed option of surgical excision to remove it if it is growing, symptomatic, or other changes noted. Please call for new or changing lesions so they can be evaluated.  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 Condition is symptomatic/ bothersome to patient. Not currently at goal.   DSAP is a chronic inherited condition of sun-exposed skin, most commonly affecting the arms and legs.  It is difficult to treat.  Recommend photoprotection and regular use of spf 30 or higher sunscreen to prevent worsening of condition and precancerous changes.  Exam: Pink scaly macules with keratotic rim at arms and legs   Treatment: Continue Cholesterol 2% Lovastatin 2% Cream 240 gm- Apply twice daily as directed to affected areas arms and legs.    ANDROGENETIC ALOPECIA (FEMALE PATTERN HAIR LOSS) Exam: Diffuse thinning of the crown  and widening of the midline part with retention of the frontal hairline, see photos  Chronic and persistent condition with duration or expected duration over one year. Condition is bothersome/symptomatic for patient.  Currently flared.   Female Androgenic Alopecia is a chronic condition related to genetics and/or hormonal changes.  In women androgenetic alopecia is commonly associated with menopause but may occur any time after puberty.  It causes hair thinning primarily on the crown with widening of the part and temporal hairline recession.  Can use OTC Rogaine (minoxidil) 5% solution/foam as directed.  Oral treatments in female patients who have no contraindication may include : - Low dose oral minoxidil 1.25 - 5mg  daily - Spironolactone 50 - 100mg  bid - Finasteride 2.5 - 5 mg daily Adjunctive therapies include: - Low Level Laser Light Therapy (LLLT) - Platelet-rich plasma injections (PRP) - Hair Transplants or scalp reduction   Treatment Plan: BP 142/78 Start Minoxidil 2.5 mg take 1 po every day dsp #30 6Rf (take 1/2 tablet daily for the first month. If tolerating ok, may increase to full tablet.)  Doses of minoxidil for hair loss are considered 'low dose'. This is because the doses used for hair loss are much lower than the doses which are used for conditions such as high blood pressure (hypertension). The doses used for hypertension are 10-40mg  per day.  Side effects are uncommon at the low doses (up to 2.5 mg/day) used to treat hair loss. Potential side effects, more commonly seen at higher doses, include: Increase in hair growth (hypertrichosis) elsewhere on face and body Temporary hair shedding upon starting medication which may last up to 4 weeks Ankle swelling, fluid retention, rapid weight gain more than 5 pounds Low blood pressure and feeling lightheaded or dizzy when standing up quickly Fast or irregular heartbeat Headaches   NEOPLASM OF UNCERTAIN BEHAVIOR OF SKIN L post neck Epidermal / dermal shaving  Lesion diameter (cm):  0.6 Informed consent: discussed and consent obtained   Patient was prepped and draped in usual sterile fashion: Area prepped with alcohol. Anesthesia: the lesion  was anesthetized in a standard fashion   Anesthetic:  1% lidocaine w/ epinephrine 1-100,000 buffered w/ 8.4% NaHCO3 Instrument used: flexible razor blade   Hemostasis achieved with: pressure, aluminum chloride and electrodesiccation   Outcome: patient tolerated procedure well    Destruction of lesion  Destruction method: electrodesiccation and curettage   Informed consent: discussed and consent obtained   Curettage performed in three different directions: Yes   Electrodesiccation performed over the curetted area: Yes   Final wound size (cm):  0.6 Hemostasis achieved with:  pressure, aluminum chloride and electrodesiccation Outcome: patient tolerated procedure well with no complications   Post-procedure details: wound care instructions given   Post-procedure details comment:  Ointment and bandage applied. Specimen 1 - Surgical pathology Differential Diagnosis: Hypertrophic AK r/o SCC Check Margins: No EDC today Shave removal and EDC today. Return in about 6 months (around 09/01/2023) for Alopecia.  I, Inez Pilgrim., CMA, am acting as scribe for Willeen Niece, MD .   Documentation: I have reviewed the above documentation for accuracy and completeness, and I agree with the above.  Willeen Niece, MD

## 2023-03-05 LAB — SURGICAL PATHOLOGY

## 2023-03-06 ENCOUNTER — Telehealth: Payer: Self-pay

## 2023-03-06 NOTE — Telephone Encounter (Signed)
Patient advised of BX results. aw

## 2023-03-06 NOTE — Telephone Encounter (Signed)
Left pt msg to call for bx results/sh

## 2023-03-06 NOTE — Telephone Encounter (Signed)
-----   Message from Willeen Niece sent at 03/05/2023  9:19 PM EST ----- 1. 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

## 2023-03-12 ENCOUNTER — Other Ambulatory Visit: Payer: Self-pay | Admitting: Nurse Practitioner

## 2023-03-13 ENCOUNTER — Telehealth: Payer: Self-pay

## 2023-03-13 NOTE — Telephone Encounter (Signed)
 Patient LM on VM requesting a refill of Triamcinolone cream.   I called pt, LM on her VM to please call here, I do not see where we have given her Triamcinolone cream, what is she using Triamcinolone cream for?

## 2023-05-14 ENCOUNTER — Ambulatory Visit (INDEPENDENT_AMBULATORY_CARE_PROVIDER_SITE_OTHER): Payer: 59 | Admitting: Nurse Practitioner

## 2023-05-14 ENCOUNTER — Encounter: Payer: Self-pay | Admitting: Nurse Practitioner

## 2023-05-14 VITALS — BP 136/80 | HR 70 | Ht 65.0 in | Wt 199.8 lb

## 2023-05-14 DIAGNOSIS — E782 Mixed hyperlipidemia: Secondary | ICD-10-CM | POA: Diagnosis not present

## 2023-05-14 DIAGNOSIS — E559 Vitamin D deficiency, unspecified: Secondary | ICD-10-CM

## 2023-05-14 DIAGNOSIS — I10 Essential (primary) hypertension: Secondary | ICD-10-CM | POA: Diagnosis not present

## 2023-05-14 DIAGNOSIS — Z7985 Long-term (current) use of injectable non-insulin antidiabetic drugs: Secondary | ICD-10-CM

## 2023-05-14 DIAGNOSIS — E1169 Type 2 diabetes mellitus with other specified complication: Secondary | ICD-10-CM

## 2023-05-14 DIAGNOSIS — E118 Type 2 diabetes mellitus with unspecified complications: Secondary | ICD-10-CM

## 2023-05-14 DIAGNOSIS — Z794 Long term (current) use of insulin: Secondary | ICD-10-CM | POA: Diagnosis not present

## 2023-05-14 DIAGNOSIS — Z7984 Long term (current) use of oral hypoglycemic drugs: Secondary | ICD-10-CM

## 2023-05-14 MED ORDER — RELION PEN NEEDLES 31G X 8 MM MISC: 3 refills | Status: DC

## 2023-05-14 MED ORDER — METFORMIN HCL ER 500 MG PO TB24: 1000.0000 mg | ORAL_TABLET | Freq: Every day | ORAL | 3 refills | Status: DC

## 2023-05-14 MED ORDER — TOUJEO MAX SOLOSTAR 300 UNIT/ML ~~LOC~~ SOPN: 50.0000 [IU] | PEN_INJECTOR | Freq: Every evening | SUBCUTANEOUS | 3 refills | Status: DC

## 2023-05-14 MED ORDER — SITAGLIPTIN PHOSPHATE 100 MG PO TABS: 100.0000 mg | ORAL_TABLET | Freq: Every day | ORAL | 3 refills | Status: DC

## 2023-05-14 NOTE — Progress Notes (Signed)
 05/14/2023   Endocrinology follow-up note    Subjective:    Patient ID: Melanie Schultz, female    DOB: 12-14-1955,  Melanie Schultz presents today for follow up of uncontrolled type 2 diabetes, hypertension, and hyperlipidemia.  Past Medical History:  Diagnosis Date   Actinic keratosis    Anxiety    Depression    Diabetes mellitus without complication (HCC)    GERD (gastroesophageal reflux disease)    Hypertension    Liver fibrosis    Multiple sclerosis (HCC)    PONV (postoperative nausea and vomiting)    Sleep apnea    Squamous cell carcinoma of skin 03/04/2023   L post neck, EDC   Past Surgical History:  Procedure Laterality Date   BREAST BIOPSY Left    BREAST EXCISIONAL BIOPSY Left    benign   CESAREAN SECTION     ENDOMETRIAL ABLATION     NASAL SEPTUM SURGERY     TRIGGER FINGER RELEASE Left 05/04/2021   Procedure: RELEASE TRIGGER FINGER/A-1 PULLEY;  Surgeon: Vickki Hearing, MD;  Location: [facility];  Service: Orthopedics;  Laterality: Left;   Social History   Socioeconomic History   Marital status: Divorced    Spouse name: Not on file   Number of children: Not on file   Years of education: Not on file   Highest education level: Not on file  Occupational History   Not on file  Tobacco Use   Smoking status: Former    Current packs/day: 0.00    Types: Cigarettes    Quit date: 02/04/1978    Years since quitting: 45.3   Smokeless tobacco: Never  Vaping Use   Vaping status: Never Used  Substance and Sexual Activity   Alcohol use: No    Alcohol/week: 0.0 standard drinks of alcohol   Drug use: No   Sexual activity: Not on file  Other Topics Concern   Not on file  Social History Narrative   Not on file   Social Drivers of Health   Financial Resource Strain: Not on file  Food Insecurity: Not on file  Transportation Needs: Not on file  Physical Activity: Not on file  Stress: Not on file  Social Connections: Not on file   Outpatient Encounter  Medications as of 05/14/2023  Medication Sig   Accu-Chek Softclix Lancets lancets 100 each by Other route 4 (four) times daily. Use as instructed   ARIPiprazole (ABILIFY) 2 MG tablet Take 2 mg by mouth in the morning.   Ascorbic Acid (VITAMIN C) 1000 MG tablet Take 1,000 mg by mouth in the morning.   aspirin EC 81 MG tablet Take 81 mg by mouth daily. Swallow whole.   atenolol (TENORMIN) 50 MG tablet Take 1 tablet (50 mg total) by mouth 2 (two) times daily.   betamethasone valerate ointment (VALISONE) 0.1 % Apply twice daily to affected areas as needed for itching. Avoid applying to face, groin, and axilla.   Cholecalciferol (VITAMIN D3) 125 MCG (5000 UT) CAPS Take 5,000 Units by mouth every evening.   Coenzyme Q10 (COQ-10) 100 MG CAPS Take 100 mg by mouth at bedtime.   Continuous Blood Gluc Receiver (DEXCOM G7 RECEIVER) DEVI See admin instructions.   Continuous Glucose Sensor (DEXCOM G7 SENSOR) MISC Change sensor every 10 days. E11.65   Cyanocobalamin (VITAMIN B-12) 5000 MCG TBDP Take 5,000 mcg by mouth in the morning.   Evening Primrose Oil 1000 MG CAPS Take 1,000 mg by mouth at bedtime.   fenofibrate 54 MG  tablet Take 54 mg by mouth daily.   ferrous sulfate 325 (65 FE) MG tablet Take 325 mg by mouth at bedtime.   FLUoxetine (PROZAC) 40 MG capsule 1 capsule Orally Once a day   glucose blood (ACCU-CHEK AVIVA PLUS) test strip TEST 4 TIMES DAILY E11.65   Homeopathic Products (LIVER SUPPORT SL) Take 2 tablets by mouth in the morning.   hydroquinone 4 % cream Apply to dark spots on face twice daily, no longer than 3 months at a time.   irbesartan (AVAPRO) 300 MG tablet Take 300 mg by mouth every morning.   Krill Oil 300 MG CAPS Take 300 mg by mouth every evening.   loratadine (CLARITIN) 10 MG tablet Take 10 mg by mouth every evening.   magnesium oxide (MAG-OX) 400 MG tablet Take 400 mg by mouth at bedtime.   methylphenidate (RITALIN) 20 MG tablet Take 20 mg by mouth 4 (four) times daily.   Milk  Thistle 175 MG CAPS Take 175 mg by mouth in the morning.   minoxidil (LONITEN) 2.5 MG tablet Take 1 tablet (2.5 mg total) by mouth daily.   Multiple Vitamin (MULTIVITAMIN WITH MINERALS) TABS tablet Take 1 tablet by mouth in the morning. Centrum   Multiple Vitamins-Minerals (ZINC PO) Take 1 tablet by mouth in the morning.   omeprazole (PRILOSEC) 20 MG capsule Take 20 mg by mouth every evening.   rosuvastatin (CRESTOR) 20 MG tablet Take 20 mg by mouth in the morning.   VASCEPA 1 g capsule 2 g 2 (two) times daily.   [DISCONTINUED] insulin glargine, 2 Unit Dial, (TOUJEO MAX SOLOSTAR) 300 UNIT/ML Solostar Pen Inject 40 Units into the skin at bedtime.   [DISCONTINUED] Insulin Pen Needle (RELION PEN NEEDLE 31G/8MM) 31G X 8 MM MISC USE WITH INSULIN QID. E11.65   [DISCONTINUED] metFORMIN (GLUCOPHAGE-XR) 500 MG 24 hr tablet Take 2 tablets (1,000 mg total) by mouth daily with breakfast. TAKE 2 TABLETS BY MOUTH ONCE DAILY WITH BREAKFAST   [DISCONTINUED] sitaGLIPtin (JANUVIA) 100 MG tablet TAKE ONE TABLET (100MG  TOTAL) BY MOUTH DAILY   insulin glargine, 2 Unit Dial, (TOUJEO MAX SOLOSTAR) 300 UNIT/ML Solostar Pen Inject 50 Units into the skin at bedtime.   Insulin Pen Needle (RELION PEN NEEDLE 31G/8MM) 31G X 8 MM MISC USE WITH INSULIN QID. E11.65   metFORMIN (GLUCOPHAGE-XR) 500 MG 24 hr tablet Take 2 tablets (1,000 mg total) by mouth daily with breakfast. TAKE 2 TABLETS BY MOUTH ONCE DAILY WITH BREAKFAST   sitaGLIPtin (JANUVIA) 100 MG tablet Take 1 tablet (100 mg total) by mouth daily.   No facility-administered encounter medications on file as of 05/14/2023.   ALLERGIES: Allergies  Allergen Reactions   Lisinopril Cough    Other reaction(s): cough   Mango Flavoring Agent (Non-Screening) Rash    Mango fruit-rash   VACCINATION STATUS:  There is no immunization history on file for this patient.  Diabetes She presents for her follow-up diabetic visit. She has type 2 diabetes mellitus. Onset time: She was  diagnosed at approximate age of 48 years. Her disease course has been worsening. There are no hypoglycemic associated symptoms. Pertinent negatives for hypoglycemia include no confusion, headaches, pallor or seizures. Associated symptoms include foot paresthesias and weight loss. Pertinent negatives for diabetes include no chest pain, no fatigue, no polydipsia, no polyphagia and no polyuria. There are no hypoglycemic complications. Symptoms are stable. Diabetic complications include nephropathy and peripheral neuropathy. Risk factors for coronary artery disease include dyslipidemia, hypertension, obesity, sedentary lifestyle and diabetes mellitus.  Current diabetic treatment includes oral agent (dual therapy) and insulin injections. She is compliant with treatment most of the time (often forgets her oral meds, glucose has been controlled so she has skipped several doses of insulin as well). Her weight is increasing steadily. She is following a generally healthy diet. When asked about meal planning, she reported none. She has had a previous visit with a dietitian. She participates in exercise intermittently. Her home blood glucose trend is increasing steadily. Her overall blood glucose range is >200 mg/dl. (She presents today with her CGM showing slightly above target glycemic profile.  Her POCT A1c today is 8.7%,increasing from last visit of 7.8%.  Analysis of her CGM shows TIR 27%, TAR 73%, TBR 0% with a GMI of 8.3%. She is incredibly afraid of low readings, says she has symptoms when glucose hits 90s.) An ACE inhibitor/angiotensin II receptor blocker is being taken. She does not see a podiatrist.Eye exam is current.  Hyperlipidemia This is a chronic problem. The current episode started more than 1 year ago. The problem is uncontrolled. Recent lipid tests were reviewed and are variable. Exacerbating diseases include chronic renal disease, diabetes and obesity. Factors aggravating her hyperlipidemia include fatty  foods, beta blockers and thiazides. Pertinent negatives include no chest pain, myalgias or shortness of breath. Current antihyperlipidemic treatment includes statins. The current treatment provides mild improvement of lipids. Compliance problems include adherence to diet.  Risk factors for coronary artery disease include diabetes mellitus, dyslipidemia, a sedentary lifestyle, post-menopausal, obesity and hypertension.  Hypertension This is a chronic problem. The current episode started more than 1 year ago. The problem has been resolved since onset. The problem is controlled. Pertinent negatives include no chest pain, headaches, palpitations or shortness of breath. Agents associated with hypertension include amphetamines. Risk factors for coronary artery disease include diabetes mellitus, dyslipidemia, post-menopausal state, sedentary lifestyle and smoking/tobacco exposure. Past treatments include angiotensin blockers, beta blockers and diuretics. The current treatment provides mild improvement. Compliance problems include diet and exercise.  Hypertensive end-organ damage includes kidney disease. Identifiable causes of hypertension include chronic renal disease.     Review of systems  Constitutional: + decreasing body weight,  current Body mass index is 33.25 kg/m. , no fatigue, no subjective hyperthermia, no subjective hypothermia Eyes: no blurry vision, no xerophthalmia ENT: no sore throat, no nodules palpated in throat, no dysphagia/odynophagia, no hoarseness Cardiovascular: no chest pain, no shortness of breath, no palpitations, no leg swelling Respiratory: no cough, no shortness of breath Gastrointestinal: no nausea/vomiting/diarrhea Musculoskeletal: no muscle/joint aches, + muscle weakness (history of MS) Skin: no rashes, no hyperemia, + hair loss from MS medication Neurological: no tremors, + numbness/tingling to BLE (esp bottom of feet), no dizziness Psychiatric: no depression, no  anxiety    Objective:    BP 136/80 (BP Location: Right Arm, Patient Position: Sitting, Cuff Size: Large)   Pulse 70   Ht 5\' 5"  (1.651 m)   Wt 199 lb 12.8 oz (90.6 kg)   BMI 33.25 kg/m   Wt Readings from Last 3 Encounters:  05/14/23 199 lb 12.8 oz (90.6 kg)  01/13/23 203 lb 9.6 oz (92.4 kg)  10/10/22 225 lb (102.1 kg)    BP Readings from Last 3 Encounters:  05/14/23 136/80  03/04/23 (!) 142/78  02/22/23 132/83     Physical Exam- Limited  Constitutional:  Body mass index is 33.25 kg/m. , not in acute distress, normal state of mind Eyes:  EOMI, no exophthalmos Musculoskeletal: no gross deformities, strength intact in  all four extremities, no gross restriction of joint movements Skin:  no rashes, no hyperemia Neurological: no tremor with outstretched hands   Diabetic Foot Exam - Simple   Simple Foot Form Diabetic Foot exam was performed with the following findings: Yes 05/14/2023  1:32 PM  Visual Inspection No deformities, no ulcerations, no other skin breakdown bilaterally: Yes Sensation Testing Intact to touch and monofilament testing bilaterally: Yes Pulse Check Posterior Tibialis and Dorsalis pulse intact bilaterally: Yes Comments     Results for orders placed or performed in visit on 03/10/23  Lab report - scanned   Collection Time: 07/25/21 12:00 AM  Result Value Ref Range   A1c 7.6    Creatinine, POC 44 mg/dL   Microalbumin, Urine 0.6    Microalb Creat Ratio 14    EGFR 87.0    Calcium 10.1    TSH 2.74 0.41 - 5.90   Lipid Panel     Component Value Date/Time   CHOL 143 07/25/2021 1646   CHOL 156 07/31/2020 0834   TRIG 237 (A) 07/08/2022 0000   HDL 37 07/08/2022 0000   HDL 33 (L) 07/31/2020 0834   CHOLHDL 4.7 (H) 07/31/2020 0834   CHOLHDL 4.2 06/17/2019 0818   VLDL 57 (H) 09/04/2016 1036   LDLCALC 113 07/08/2022 0000   LDLCALC 90 07/31/2020 0834   LDLCALC 90 06/17/2019 0818   Recent Results (from the past 2160 hours)  Surgical pathology      Status: None   Collection Time: 03/04/23 12:00 AM  Result Value Ref Range   SURGICAL PATHOLOGY      SURGICAL PATHOLOGY [facility] [address], [address] [place], [place] [address] Telephone [phone] or [phone] Fax [phone]  REPORT OF DERMATOPATHOLOGY   Accession #: [id number] Patient Name: VIHANA, KYDD Visit # : [id number]  MRN: [id number] Cytotechnologist: Munoz-Bishop Md, Efraim Kaufmann, Dermatopathologist, Electronic Signature DOB/Age [birth year] (Age: 86) Gender: F Collected Date: 03/04/2023 Received Date: 03/04/2023  FINAL DIAGNOSIS       1. Skin, L post neck :       WELL DIFFERENTIATED SQUAMOUS CELL CARCINOMA       DATE SIGNED OUT: 03/05/2023 ELECTRONIC SIGNATURE : Munoz-Bishop Md, Melissa, Dermatopathologist, Electronic Signature  MICROSCOPIC DESCRIPTION 1. There is a proliferation of atypical epithelial cells with squamous differentiation invading the dermis.  This is a well differentiated squamous cell carcinoma.  CASE COMMENTS STAINS USED IN DIAGNOSIS: H&E    CLINICAL HISTORY  SPECIMEN(S) OBTAINED 1.  Skin, L Post Neck  SPECIMEN COMMENTS: 1. 6.0 mm pink scaly papule, EDC today SPECIMEN CLINICAL INFORMATION: 1. Neoplasm of uncertain behavior of skin, hypertrophic AK R/O SCC    Gross Description 1. Formalin fixed specimen received:  6 X 5 X 2 MM, TOTO (2 P) (1 B) ( alb )        Report signed out from the following location(s) [place] [address], [place] [address] CLIA #: [id number]  [facility] [address] [place], [place] [address] CLIA #: [id number]           Assessment & Plan:   1) Controlled type 2 diabetes mellitus without complication, with long-term current use of insulin (HCC)  She presents today with her CGM showing slightly above target glycemic profile.  Her POCT A1c today is 8.7%,increasing from last visit of 7.8%.  Analysis of  her CGM shows TIR 27%, TAR 73%, TBR 0% with a GMI of 8.3%. She is incredibly  afraid of low readings, says she has symptoms when glucose hits 90s.   Recent labs reviewed showing elevated liver function tests (following up with PCP about this).  - Patient remains at a high risk for more acute and chronic complications of diabetes which include CAD, CVA, CKD, retinopathy, and neuropathy. These are all discussed in detail with the patient.  - Nutritional counseling repeated at each appointment due to patients tendency to fall back in to old habits.  - The patient admits there is a room for improvement in their diet and drink choices. -  Suggestion is made for the patient to avoid simple carbohydrates from their diet including Cakes, Sweet Desserts / Pastries, Ice Cream, Soda (diet and regular), Sweet Tea, Candies, Chips, Cookies, Sweet Pastries, Store Bought Juices, Alcohol in Excess of 1-2 drinks a day, Artificial Sweeteners, Coffee Creamer, and "Sugar-free" Products. This will help patient to have stable blood glucose profile and potentially avoid unintended weight gain.   - I encouraged the patient to switch to unprocessed or minimally processed complex starch and increased protein intake (animal or plant source), fruits, and vegetables.   - Patient is advised to stick to a routine mealtimes to eat 3 meals a day and avoid unnecessary snacks (to snack only to correct hypoglycemia).  - I have approached patient with the following individualized plan to manage diabetes and patient agrees.  -She is advised to increase her Toujeo to 50 units SQ nightly and continue Metformin 1000 mg ER daily and Januvia 100 mg po daily.  -She is encouraged to continue to monitor blood glucose at least 4 times a day (using her CGM), before meals and at bedtime and report blood glucose readings less than 70 or greater than 200 for 3 tests in a row.    -She did not tolerate Ozempic.  - Patient specific target  for A1c;  LDL, HDL, Triglycerides, and  Waist Circumference were discussed in detail.  2) BP/HTN:  Her blood pressure is controlled to target.  She is advised to continue Atenolol 50 mg p.o. twice daily and Losartan 50 mg p.o. daily.    3) Lipids/HPL: Her most recent lipid panel from 07/08/22 show uncontrolled LDL at 113 and elevated triglycerides of 237.  She is advised to continue Crestor 20 mg po daily at bedtime and Vascepa 1 g po BID (says she has been forgetting her second dose).  Side effects and precautions discussed with her.    4)  Weight/Diet:  Her Body mass index is 33.25 kg/m. -clearly complicating her diabetes management.  She is a candidate for modest weight loss.  CDE consult in progress, exercise, and carbohydrates information provided.  5) Chronic Care/Health Maintenance: -Patient is on ACE and Statin medications and is encouraged to continue to follow up with Ophthalmology, Podiatrist at least yearly or according to recommendations, and advised to  stay away from smoking. I have recommended yearly flu vaccine and pneumonia vaccination at least every 5 years; moderate intensity exercise for up to 150 minutes weekly; and  sleep for at least 7 hours a day.  I advised patient to maintain close follow up with her PCP for primary care needs and regarding her skin lesion.     I spent  46  minutes in the care of the patient today including review of labs from CMP, Lipids, Thyroid Function, Hematology (current and previous including abstractions from other facilities); face-to-face time discussing  her blood glucose readings/logs, discussing hypoglycemia and hyperglycemia episodes and symptoms, medications  doses, her options of short and long term treatment based on the latest standards of care / guidelines;  discussion about incorporating lifestyle medicine;  and documenting the encounter. Risk reduction counseling performed per USPSTF guidelines to reduce obesity and cardiovascular risk factors.      Please refer to Patient Instructions for Blood Glucose Monitoring and Insulin/Medications Dosing Guide"  in media tab for additional information. Please  also refer to " Patient Self Inventory" in the Media  tab for reviewed elements of pertinent patient history.  Alben Spittle Hipolito participated in the discussions, expressed understanding, and voiced agreement with the above plans.  All questions were answered to her satisfaction. she is encouraged to contact clinic should she have any questions or concerns prior to her return visit.     Follow up plan: Return in about 3 months (around 08/13/2023) for Diabetes F/U with A1c in office, Bring meter and logs, No previsit labs.   Ronny Bacon, [facility] [address] [place], [place] [address] Phone: [phone] Fax: [phone]  05/14/2023, 1:40 PM

## 2023-08-20 ENCOUNTER — Ambulatory Visit: Admitting: Nurse Practitioner

## 2023-08-25 ENCOUNTER — Other Ambulatory Visit: Payer: Self-pay

## 2023-08-25 ENCOUNTER — Telehealth: Payer: Self-pay

## 2023-08-25 MED ORDER — MINOXIDIL 2.5 MG PO TABS: 2.5000 mg | ORAL_TABLET | Freq: Every day | ORAL | 1 refills | Status: DC

## 2023-08-25 NOTE — Telephone Encounter (Signed)
 Refill request sent from [facility]. Prescription sent electronically.

## 2023-08-25 NOTE — Telephone Encounter (Signed)
 Patient called requesting a 90 day supply of Minoxidil  tablets

## 2023-08-27 ENCOUNTER — Encounter: Payer: Self-pay | Admitting: Nurse Practitioner

## 2023-08-27 ENCOUNTER — Ambulatory Visit (INDEPENDENT_AMBULATORY_CARE_PROVIDER_SITE_OTHER): Admitting: Nurse Practitioner

## 2023-08-27 VITALS — BP 130/82 | HR 73 | Ht 64.5 in | Wt 201.0 lb

## 2023-08-27 DIAGNOSIS — Z7984 Long term (current) use of oral hypoglycemic drugs: Secondary | ICD-10-CM | POA: Diagnosis not present

## 2023-08-27 DIAGNOSIS — E1169 Type 2 diabetes mellitus with other specified complication: Secondary | ICD-10-CM | POA: Diagnosis not present

## 2023-08-27 DIAGNOSIS — E559 Vitamin D deficiency, unspecified: Secondary | ICD-10-CM

## 2023-08-27 DIAGNOSIS — I10 Essential (primary) hypertension: Secondary | ICD-10-CM

## 2023-08-27 DIAGNOSIS — E782 Mixed hyperlipidemia: Secondary | ICD-10-CM | POA: Diagnosis not present

## 2023-08-27 DIAGNOSIS — Z7985 Long-term (current) use of injectable non-insulin antidiabetic drugs: Secondary | ICD-10-CM

## 2023-08-27 DIAGNOSIS — Z794 Long term (current) use of insulin: Secondary | ICD-10-CM

## 2023-08-27 LAB — POCT GLYCOSYLATED HEMOGLOBIN (HGB A1C): Hemoglobin A1C: 9.1 % — AB (ref 4.0–5.6)

## 2023-08-27 MED ORDER — DEXCOM G7 SENSOR MISC: 1 refills | Status: DC

## 2023-08-27 NOTE — Progress Notes (Signed)
 08/27/2023   Endocrinology follow-up note    Subjective:    Patient ID: Melanie Schultz, female    DOB: Mar 11, 1955,  Melanie Schultz presents today for follow up of uncontrolled type 2 diabetes, hypertension, and hyperlipidemia.  Past Medical History:  Diagnosis Date   Actinic keratosis    Anxiety    Depression    Diabetes mellitus without complication (HCC)    GERD (gastroesophageal reflux disease)    Hypertension    Liver fibrosis    Multiple sclerosis (HCC)    PONV (postoperative nausea and vomiting)    Sleep apnea    Squamous cell carcinoma of skin 03/04/2023   L post neck, ED[facility]   Past Surgical History:  Procedure Laterality Date   BREAST BIOPSY Left    BREAST EXCISIONAL BIOPSY Left    benign   CESAREAN SECTION     ENDOMETRIAL ABLATION     NASAL SEPTUM SURGERY     TRIGGER FINGER RELEASE Left 05/04/2021   Procedure: RELEASE TRIGGER FINGER/A-1 PULLEY;  Surgeon: Margrette Taft FORBES, MD;  Location: [facility];  Service: Orthopedics;  Laterality: Left;   Social History   Socioeconomic History   Marital status: Divorced    Spouse name: Not on file   Number of children: Not on file   Years of education: Not on file   Highest education level: Not on file  Occupational History   Not on file  Tobacco Use   Smoking status: Former    Current packs/day: 0.00    Types: Cigarettes    Quit date: 02/04/1978    Years since quitting: 45.5   Smokeless tobacco: Never  Vaping Use   Vaping status: Never Used  Substance and Sexual Activity   Alcohol use: No    Alcohol/week: 0.0 standard drinks of alcohol   Drug use: No   Sexual activity: Not on file  Other Topics Concern   Not on file  Social History Narrative   Not on file   Social Drivers of Health   Financial Resource Strain: Not on file  Food Insecurity: Not on file  Transportation Needs: Not on file  Physical Activity: Not on file  Stress: Not on file  Social Connections: Not on file   Outpatient Encounter  Medications as of 08/27/2023  Medication Sig   Accu-Chek Softclix Lancets lancets 100 each by Other route 4 (four) times daily. Use as instructed   ARIPiprazole (ABILIFY) 2 MG tablet Take 2 mg by mouth in the morning.   Ascorbic Acid (VITAMIN C) 1000 MG tablet Take 1,000 mg by mouth in the morning.   aspirin EC 81 MG tablet Take 81 mg by mouth daily. Swallow whole.   atenolol  (TENORMIN ) 50 MG tablet Take 1 tablet (50 mg total) by mouth 2 (two) times daily.   betamethasone  valerate ointment (VALISONE ) 0.1 % Apply twice daily to affected areas as needed for itching. Avoid applying to face, groin, and axilla.   BIOTIN PO Take by mouth.   BIOTIN PO Take by mouth 2 (two) times daily.   Cholecalciferol (VITAMIN D3) 125 MCG (5000 UT) CAPS Take 5,000 Units by mouth every evening.   Coenzyme Q10 (COQ-10) 100 MG CAPS Take 100 mg by mouth at bedtime.   Continuous Blood Gluc Receiver (DEXCOM G7 RECEIVER) DEVI See admin instructions.   Cyanocobalamin (VITAMIN B-12) 5000 MCG TBDP Take 5,000 mcg by mouth in the morning.   Evening Primrose Oil 1000 MG CAPS Take 1,000 mg by mouth at bedtime.  fenofibrate 54 MG tablet Take 54 mg by mouth daily.   ferrous sulfate 325 (65 FE) MG tablet Take 325 mg by mouth at bedtime.   FLUoxetine (PROZAC) 40 MG capsule 1 capsule Orally Once a day   glucose blood (ACCU-CHEK AVIVA PLUS) test strip TEST 4 TIMES DAILY E11.65   Homeopathic Products (LIVER SUPPORT SL) Take 2 tablets by mouth in the morning.   hydroquinone  4 % cream Apply to dark spots on face twice daily, no longer than 3 months at a time.   insulin  glargine, 2 Unit Dial , (TOUJEO  MAX SOLOSTAR) 300 UNIT/ML Solostar Pen Inject 50 Units into the skin at bedtime.   Insulin  Pen Needle (RELION PEN NEEDLE 31G/8MM) 31G X 8 MM MISC USE WITH INSULIN  QID. E11.65   irbesartan (AVAPRO) 300 MG tablet Take 300 mg by mouth every morning.   Krill Oil 300 MG CAPS Take 300 mg by mouth every evening.   loratadine (CLARITIN) 10 MG  tablet Take 10 mg by mouth every evening.   magnesium oxide (MAG-OX) 400 MG tablet Take 400 mg by mouth at bedtime.   metFORMIN  (GLUCOPHAGE -XR) 500 MG 24 hr tablet Take 2 tablets (1,000 mg total) by mouth daily with breakfast. TAKE 2 TABLETS BY MOUTH ONCE DAILY WITH BREAKFAST   methylphenidate (RITALIN) 20 MG tablet Take 20 mg by mouth 4 (four) times daily.   Milk Thistle 175 MG CAPS Take 175 mg by mouth in the morning.   minoxidil  (LONITEN ) 2.5 MG tablet Take 1 tablet (2.5 mg total) by mouth daily.   Multiple Vitamin (MULTIVITAMIN WITH MINERALS) TABS tablet Take 1 tablet by mouth in the morning. Centrum   Multiple Vitamins-Minerals (ZINC PO) Take 1 tablet by mouth in the morning.   omeprazole (PRILOSEC) 20 MG capsule Take 20 mg by mouth every evening.   OVER THE COUNTER MEDICATION Take by mouth daily. Horse Tail Vitamin daily for hair growth   rosuvastatin (CRESTOR) 20 MG tablet Take 20 mg by mouth in the morning.   sitaGLIPtin  (JANUVIA ) 100 MG tablet Take 1 tablet (100 mg total) by mouth daily.   VASCEPA 1 g capsule 2 g 2 (two) times daily.   [DISCONTINUED] Continuous Glucose Sensor (DEXCOM G7 SENSOR) MISC Change sensor every 10 days. E11.65   Continuous Glucose Sensor (DEXCOM G7 SENSOR) MISC Change sensor every 10 days. E11.65   No facility-administered encounter medications on file as of 08/27/2023.   ALLERGIES: Allergies  Allergen Reactions   Lisinopril Cough    Other reaction(s): cough   Mango Flavoring Agent (Non-Screening) Rash    Mango fruit-rash   VACCINATION STATUS:  There is no immunization history on file for this patient.  Diabetes She presents for her follow-up diabetic visit. She has type 2 diabetes mellitus. Onset time: She was diagnosed at approximate age of 48 years. Her disease course has been worsening. There are no hypoglycemic associated symptoms. Pertinent negatives for hypoglycemia include no confusion, headaches, pallor or seizures. Associated symptoms include  foot paresthesias. Pertinent negatives for diabetes include no chest pain, no fatigue, no polydipsia, no polyphagia, no polyuria and no weight loss. There are no hypoglycemic complications. Symptoms are stable. Diabetic complications include nephropathy and peripheral neuropathy. Risk factors for coronary artery disease include dyslipidemia, hypertension, obesity, sedentary lifestyle and diabetes mellitus. Current diabetic treatment includes oral agent (dual therapy) and insulin  injections. She is compliant with treatment most of the time (often forgets her oral meds). Her weight is increasing steadily. She is following a generally unhealthy diet. When  asked about meal planning, she reported none. She has had a previous visit with a dietitian. She participates in exercise intermittently. Her home blood glucose trend is increasing steadily. Her overall blood glucose range is >200 mg/dl. (She presents today with her CGM showing above target glycemic profile.  Her POCT A1c today is 9.1%,increasing from last visit of 8.7%.  Analysis of her CGM shows TIR 16%, TAR 84%, TBR 0% with a GMI of 9.1%. She has been more stressed recently, had to find a new job after not working for 15+ years.  She admits to eating snacks more and eating the wrong foods.) An ACE inhibitor/angiotensin II receptor blocker is being taken. She does not see a podiatrist.Eye exam is current.  Hyperlipidemia This is a chronic problem. The current episode started more than 1 year ago. The problem is uncontrolled. Recent lipid tests were reviewed and are variable. Exacerbating diseases include chronic renal disease, diabetes and obesity. Factors aggravating her hyperlipidemia include fatty foods, beta blockers and thiazides. Pertinent negatives include no chest pain, myalgias or shortness of breath. Current antihyperlipidemic treatment includes statins. The current treatment provides mild improvement of lipids. Compliance problems include adherence to  diet.  Risk factors for coronary artery disease include diabetes mellitus, dyslipidemia, a sedentary lifestyle, post-menopausal, obesity and hypertension.  Hypertension This is a chronic problem. The current episode started more than 1 year ago. The problem has been resolved since onset. The problem is controlled. Pertinent negatives include no chest pain, headaches, palpitations or shortness of breath. Agents associated with hypertension include amphetamines. Risk factors for coronary artery disease include diabetes mellitus, dyslipidemia, post-menopausal state, sedentary lifestyle and smoking/tobacco exposure. Past treatments include angiotensin blockers, beta blockers and diuretics. The current treatment provides mild improvement. Compliance problems include diet and exercise.  Hypertensive end-organ damage includes kidney disease. Identifiable causes of hypertension include chronic renal disease.     Review of systems  Constitutional: + stable body weight,  current Body mass index is 33.97 kg/m. , no fatigue, no subjective hyperthermia, no subjective hypothermia Eyes: no blurry vision, no xerophthalmia ENT: no sore throat, no nodules palpated in throat, no dysphagia/odynophagia, no hoarseness Cardiovascular: no chest pain, no shortness of breath, no palpitations, no leg swelling Respiratory: no cough, no shortness of breath Gastrointestinal: no nausea/vomiting/diarrhea Musculoskeletal: no muscle/joint aches, + muscle weakness (history of MS) Skin: no rashes, no hyperemia, + hair loss from MS medication Neurological: no tremors, + numbness/tingling to BLE (esp bottom of feet), no dizziness Psychiatric: no depression, no anxiety    Objective:    BP 130/82 (BP Location: Right Arm, Patient Position: Sitting, Cuff Size: Large)   Pulse 73   Ht 5' 4.5 (1.638 m)   Wt 201 lb (91.2 kg)   BMI 33.97 kg/m   Wt Readings from Last 3 Encounters:  08/27/23 201 lb (91.2 kg)  05/14/23 199 lb 12.8  oz (90.6 kg)  01/13/23 203 lb 9.6 oz (92.4 kg)    BP Readings from Last 3 Encounters:  08/27/23 130/82  05/14/23 136/80  03/04/23 (!) 142/78     Physical Exam- Limited  Constitutional:  Body mass index is 33.97 kg/m. , not in acute distress, normal state of mind Eyes:  EOMI, no exophthalmos Musculoskeletal: no gross deformities, strength intact in all four extremities, no gross restriction of joint movements Skin:  no rashes, no hyperemia Neurological: no tremor with outstretched hands   Diabetic Foot Exam - Simple   No data filed     Results for orders  placed or performed in visit on 08/27/23  HgB A1c   Collection Time: 08/27/23  8:29 AM  Result Value Ref Range   Hemoglobin A1C 9.1 (A) 4.0 - 5.6 %   HbA1c POC (<> result, manual entry)     HbA1c, POC (prediabetic range)     HbA1c, POC (controlled diabetic range)     Lipid Panel     Component Value Date/Time   CHOL 143 07/25/2021 1646   CHOL 156 07/31/2020 0834   TRIG 237 (A) 07/08/2022 0000   HDL 37 07/08/2022 0000   HDL 33 (L) 07/31/2020 0834   CHOLHDL 4.7 (H) 07/31/2020 0834   CHOLHDL 4.2 06/17/2019 0818   VLDL 57 (H) 09/04/2016 1036   LDLCALC 113 07/08/2022 0000   LDLCALC 90 07/31/2020 0834   LDLCALC 90 06/17/2019 0818   Recent Results (from the past 2160 hours)  HgB A1c     Status: Abnormal   Collection Time: 08/27/23  8:29 AM  Result Value Ref Range   Hemoglobin A1C 9.1 (A) 4.0 - 5.6 %   HbA1c POC (<> result, manual entry)     HbA1c, POC (prediabetic range)     HbA1c, POC (controlled diabetic range)             Assessment & Plan:   1) Controlled type 2 diabetes mellitus without complication, with long-term current use of insulin  (HCC)  She presents today with her CGM showing above target glycemic profile.  Her POCT A1c today is 9.1%,increasing from last visit of 8.7%.  Analysis of her CGM shows TIR 16%, TAR 84%, TBR 0% with a GMI of 9.1%. She has been more stressed recently, had to find a new  job after not working for 15+ years.  She admits to eating snacks more and eating the wrong foods.   Recent labs reviewed showing elevated liver function tests (following up with PCP about this).  - Patient remains at a high risk for more acute and chronic complications of diabetes which include CAD, CVA, CKD, retinopathy, and neuropathy. These are all discussed in detail with the patient.  - Nutritional counseling repeated at each appointment due to patients tendency to fall back in to old habits.  - The patient admits there is a room for improvement in their diet and drink choices. -  Suggestion is made for the patient to avoid simple carbohydrates from their diet including Cakes, Sweet Desserts / Pastries, Ice Cream, Soda (diet and regular), Sweet Tea, Candies, Chips, Cookies, Sweet Pastries, Store Bought Juices, Alcohol in Excess of 1-2 drinks a day, Artificial Sweeteners, Coffee Creamer, and Sugar-free Products. This will help patient to have stable blood glucose profile and potentially avoid unintended weight gain.   - I encouraged the patient to switch to unprocessed or minimally processed complex starch and increased protein intake (animal or plant source), fruits, and vegetables.   - Patient is advised to stick to a routine mealtimes to eat 3 meals a day and avoid unnecessary snacks (to snack only to correct hypoglycemia).  - I have approached patient with the following individualized plan to manage diabetes and patient agrees.  -She is advised to continue her Toujeo  50 units SQ nightly and continue Metformin  1000 mg ER daily and Januvia  100 mg po daily.  She is asking for more time to get her eating right before adjusting her diabetes regimen.  -She is encouraged to continue to monitor blood glucose at least 4 times a day (using her CGM), before meals and  at bedtime and report blood glucose readings less than 70 or greater than 200 for 3 tests in a row.    -She did not tolerate  Ozempic .  - Patient specific target  for A1c; LDL, HDL, Triglycerides, and  Waist Circumference were discussed in detail.  2) BP/HTN:  Her blood pressure is controlled to target.  She is advised to continue Atenolol  50 mg p.o. twice daily and Losartan 50 mg p.o. daily.    3) Lipids/HPL: Her most recent lipid panel from 07/08/22 show uncontrolled LDL at 113 and elevated triglycerides of 237.  She is advised to continue Crestor 20 mg po daily at bedtime and Vascepa 1 g po BID (says she has been forgetting her second dose).  Side effects and precautions discussed with her.    4)  Weight/Diet:  Her Body mass index is 33.97 kg/m. -clearly complicating her diabetes management.  She is a candidate for modest weight loss.  CDE consult in progress, exercise, and carbohydrates information provided.  5) Chronic Care/Health Maintenance: -Patient is on ACE and Statin medications and is encouraged to continue to follow up with Ophthalmology, Podiatrist at least yearly or according to recommendations, and advised to  stay away from smoking. I have recommended yearly flu vaccine and pneumonia vaccination at least every 5 years; moderate intensity exercise for up to 150 minutes weekly; and  sleep for at least 7 hours a day.  I advised patient to maintain close follow up with her PCP for primary care needs and regarding her skin lesion.     I spent  57  minutes in the care of the patient today including review of labs from CMP, Lipids, Thyroid Function, Hematology (current and previous including abstractions from other facilities); face-to-face time discussing  her blood glucose readings/logs, discussing hypoglycemia and hyperglycemia episodes and symptoms, medications doses, her options of short and long term treatment based on the latest standards of care / guidelines;  discussion about incorporating lifestyle medicine;  and documenting the encounter. Risk reduction counseling performed per USPSTF guidelines to  reduce obesity and cardiovascular risk factors.     Please refer to Patient Instructions for Blood Glucose Monitoring and Insulin /Medications Dosing Guide  in media tab for additional information. Please  also refer to  Patient Self Inventory in the Media  tab for reviewed elements of pertinent patient history.  Melanie Schultz participated in the discussions, expressed understanding, and voiced agreement with the above plans.  All questions were answered to her satisfaction. she is encouraged to contact clinic should she have any questions or concerns prior to her return visit.     Follow up plan: Return in about 3 months (around 11/27/2023) for Diabetes F/U with A1c in office, No previsit labs, Bring meter and logs.   Benton Rio, [facility] [address] [place], [place] [address] Phone: [phone] Fax: [phone]  08/27/2023, 8:45 AM

## 2023-09-02 ENCOUNTER — Ambulatory Visit: Payer: 59 | Admitting: Dermatology

## 2023-09-24 ENCOUNTER — Ambulatory Visit

## 2023-09-24 DIAGNOSIS — Z8616 Personal history of COVID-19: Secondary | ICD-10-CM

## 2023-09-24 DIAGNOSIS — W57XXXA Bitten or stung by nonvenomous insect and other nonvenomous arthropods, initial encounter: Secondary | ICD-10-CM | POA: Diagnosis not present

## 2023-09-24 DIAGNOSIS — L650 Telogen effluvium: Secondary | ICD-10-CM | POA: Diagnosis not present

## 2023-09-24 DIAGNOSIS — Z85828 Personal history of other malignant neoplasm of skin: Secondary | ICD-10-CM

## 2023-09-24 DIAGNOSIS — L649 Androgenic alopecia, unspecified: Secondary | ICD-10-CM

## 2023-09-24 DIAGNOSIS — C4490 Unspecified malignant neoplasm of skin, unspecified: Secondary | ICD-10-CM

## 2023-09-24 DIAGNOSIS — S30861A Insect bite (nonvenomous) of abdominal wall, initial encounter: Secondary | ICD-10-CM

## 2023-09-24 MED ORDER — MINOXIDIL 2.5 MG PO TABS: ORAL_TABLET | ORAL | 1 refills | Status: AC

## 2023-09-24 NOTE — Progress Notes (Signed)
 Follow-Up Visit   Subjective  Melanie Schultz is a 68 y.o. female who presents for the following: alopecia follow up, pt states that she felt that hair was getting thicker, but then a lot started falling out. She is taking OTC vitamins for hair, minoxidil  2.5 mg po QD, and had Covid a few months ago after traveling. Pt not experiencing s/e with minoxidil  2.5 mg po QD. Pt would like SCC site checked today on the L post neck. Pt concerned about a tick bite on her abdomen.   The following portions of the chart were reviewed this encounter and updated as appropriate: medications, allergies, medical history  Review of Systems:  No other skin or systemic complaints except as noted in HPI or Assessment and Plan.  Objective  Well appearing patient in no apparent distress; mood and affect are within normal limits.   A focused examination was performed of the following areas: the scalp, face, and neck   Relevant exam findings are noted in the Assessment and Plan.    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 Condition is improving with treatment but not currently at goal.  Female Androgenic Alopecia is a chronic condition related to genetics and/or hormonal changes.  In women androgenetic alopecia is commonly associated with menopause but may occur any time after puberty.  It causes hair thinning primarily on the crown with widening of the part and temporal hairline recession.  Can use OTC Rogaine  (minoxidil ) 5% solution/foam as directed.  Oral treatments in female patients who have no contraindication may include : - Low dose oral minoxidil  - increase to 3.75 mg daily   Increase  minoxidil  to 3.75 mg po QD. Doses of oral minoxidil  for hair loss are considered 'low dose'. This is because the doses used for hair loss are much lower than the doses which are used for conditions such as high blood pressure (hypertension). The doses used for hypertension are 10-40mg  per day.  Side effects are uncommon at the low doses (up to 2.5 mg/day) used to treat hair loss. Potential side effects, more commonly seen at higher doses, include: Increase in hair growth (hypertrichosis) elsewhere on face and body Temporary hair shedding upon starting medication which may last up to 4 weeks Ankle swelling, fluid retention, rapid weight gain more than 5 pounds Low blood pressure and feeling lightheaded or dizzy when standing up quickly Fast or irregular heartbeat Headaches  Pt defers topical minoxidil  she doesn't like the way it feels.  HISTORY OF SQUAMOUS CELL CARCINOMA OF THE SKIN - L post neck, tx with ED&C on 03/04/23 - No evidence of recurrence today - No lymphadenopathy - Recommend regular full body skin exams - Recommend daily broad spectrum sunscreen SPF 30+ to sun-exposed areas, reapply every 2 hours as needed.  - Call if any new or changing lesions are noted between office visits  Bug bite - L abdomen, erythematous papule, no tx needed.   Return in about 5 months (around 02/24/2024) for TBSE - hx TBSE, alopecia.  Melanie Schultz, CMA, am acting as scribe for Lauraine JAYSON Kanaris, MD .   Documentation: I have reviewed the  above documentation for accuracy and completeness, and I agree with the above.  Lauraine JAYSON Kanaris, MD

## 2023-09-24 NOTE — Patient Instructions (Signed)
 Increase minoxidil  to 3.75 mg po QD.   Doses of oral minoxidil  for hair loss are considered 'low dose'. This is because the doses used for hair loss are much lower than the doses which are used for conditions such as high blood pressure (hypertension). The doses used for hypertension are 10-40mg  per day.  Side effects are uncommon at the low doses (up to 2.5 mg/day) used to treat hair loss. Potential side effects, more commonly seen at higher doses, include: Increase in hair growth (hypertrichosis) elsewhere on face and body Temporary hair shedding upon starting medication which may last up to 4 weeks Ankle swelling, fluid retention, rapid weight gain more than 5 pounds Low blood pressure and feeling lightheaded or dizzy when standing up quickly Fast or irregular heartbeat Headaches  Due to recent changes in healthcare laws, you may see results of your pathology and/or laboratory studies on MyChart before the doctors have had a chance to review them. We understand that in some cases there may be results that are confusing or concerning to you. Please understand that not all results are received at the same time and often the doctors may need to interpret multiple results in order to provide you with the best plan of care or course of treatment. Therefore, we ask that you please give us  2 business days to thoroughly review all your results before contacting the office for clarification. Should we see a critical lab result, you will be contacted sooner.   If You Need Anything After Your Visit  If you have any questions or concerns for your doctor, please call our main line at [phone] and press option 4 to reach your doctor's medical assistant. If no one answers, please leave a voicemail as directed and we will return your call as soon as possible. Messages left after 4 pm will be answered the following business day.   You may also send us  a message via MyChart. We typically respond to MyChart  messages within 1-2 business days.  For prescription refills, please ask your pharmacy to contact our office. Our fax number is [phone].  If you have an urgent issue when the clinic is closed that cannot wait until the next business day, you can page your doctor at the number below.    Please note that while we do our best to be available for urgent issues outside of office hours, we are not available 24/7.   If you have an urgent issue and are unable to reach us , you may choose to seek medical care at your doctor's office, retail clinic, urgent care center, or emergency room.  If you have a medical emergency, please immediately call 911 or go to the emergency department.  Pager Numbers  - Dr. Hester: [phone]  - Dr. Jackquline: [phone]  - Dr. Claudene: [phone]   - Dr. Raymund: [phone]  In the event of inclement weather, please call our main line at [phone] for an update on the status of any delays or closures.  Dermatology Medication Tips: Please keep the boxes that topical medications come in in order to help keep track of the instructions about where and how to use these. Pharmacies typically print the medication instructions only on the boxes and not directly on the medication tubes.   If your medication is too expensive, please contact our office at [phone] option 4 or send us  a message through MyChart.   We are unable to tell what your co-pay for medications will be in advance  as this is different depending on your insurance coverage. However, we may be able to find a substitute medication at lower cost or fill out paperwork to get insurance to cover a needed medication.   If a prior authorization is required to get your medication covered by your insurance company, please allow us  1-2 business days to complete this process.  Drug prices often vary depending on where the prescription is filled and some pharmacies may offer cheaper prices.  The  website www.goodrx.com contains coupons for medications through different pharmacies. The prices here do not account for what the cost may be with help from insurance (it may be cheaper with your insurance), but the website can give you the price if you did not use any insurance.  - You can print the associated coupon and take it with your prescription to the pharmacy.  - You may also stop by our office during regular business hours and pick up a GoodRx coupon card.  - If you need your prescription sent electronically to a different pharmacy, notify our office through [facility] or by phone at [phone] option 4.     Si Usted Necesita Algo Despus de Su Visita  Tambin puede enviarnos un mensaje a travs de Clinical cytogeneticist. Por lo general respondemos a los mensajes de MyChart en el transcurso de 1 a 2 das hbiles.  Para renovar recetas, por favor pida a su farmacia que se ponga en contacto con nuestra oficina. Randi lakes de fax es [place] [phone].  Si tiene un asunto urgente cuando la clnica est cerrada y que no puede esperar hasta el siguiente da hbil, puede llamar/localizar a su doctor(a) al nmero que aparece a continuacin.   Por favor, tenga en cuenta que aunque hacemos todo lo posible para estar disponibles para asuntos urgentes fuera del horario de [place], no estamos disponibles las 24 horas del da, los 7 [address]  [address] de la [place].   Si tiene un problema urgente y no puede comunicarse con nosotros, puede optar por buscar atencin mdica  en el consultorio de su doctor(a), en una clnica privada, en un centro de atencin urgente o en una sala de emergencias.  Si tiene Engineer, drilling, por favor llame inmediatamente al 911 o vaya a la sala de emergencias.  Nmeros de bper  - Dr. Hester: [phone]  - Dra. Jackquline: [phone]  - Dr. Claudene: [phone]  - Dra. Kitts: [phone]  En caso de inclemencias del [place], por favor llame a nuestra lnea principal al  [phone] para una actualizacin sobre el estado de cualquier retraso o cierre.  Consejos para la medicacin en dermatologa: Por favor, guarde las cajas en las que vienen los medicamentos de uso tpico para ayudarle a seguir las instrucciones sobre dnde y cmo usarlos. Las farmacias generalmente imprimen las instrucciones del medicamento slo en las cajas y no directamente en los tubos del [place].   Si su medicamento es muy caro, por favor, pngase en contacto con landry rieger llamando al [phone] y presione la opcin 4 o envenos un mensaje a travs de Clinical cytogeneticist.   No podemos decirle cul ser su copago por los medicamentos por adelantado ya que esto es diferente dependiendo de la cobertura de su seguro. Sin embargo, es posible que podamos encontrar un medicamento sustituto a Audiological scientist un formulario para que el seguro cubra el medicamento que se considera necesario.   Si se requiere una autorizacin previa para que su compaa de seguros malta su medicamento, por favor permtanos  de 1 a 2 das hbiles para completar [address]  Los precios de los medicamentos varan con frecuencia dependiendo del Environmental consultant de dnde se surte la receta y alguna farmacias pueden ofrecer precios ms baratos.  El sitio web www.goodrx.com tiene cupones para medicamentos de Health and safety inspector. Los precios aqu no tienen en cuenta lo que podra costar con la ayuda del seguro (puede ser ms barato con su seguro), pero el sitio web puede darle el precio si no utiliz Tourist information centre manager.  - Puede imprimir el cupn correspondiente y llevarlo con su receta a la farmacia.  - Tambin puede pasar por nuestra oficina durante el horario de atencin regular y Education officer, museum una tarjeta de cupones de GoodRx.  - Si necesita que su receta se enve electrnicamente a una farmacia diferente, informe a nuestra oficina a travs de MyChart de [place] o por telfono llamando al [phone] y presione la opcin 4.

## 2023-11-27 ENCOUNTER — Ambulatory Visit: Admitting: Nurse Practitioner

## 2024-02-03 ENCOUNTER — Encounter: Payer: Self-pay | Admitting: Nurse Practitioner

## 2024-02-03 ENCOUNTER — Ambulatory Visit (INDEPENDENT_AMBULATORY_CARE_PROVIDER_SITE_OTHER): Admitting: Nurse Practitioner

## 2024-02-03 VITALS — BP 118/66 | HR 64 | Ht 64.5 in | Wt 199.6 lb

## 2024-02-03 DIAGNOSIS — E782 Mixed hyperlipidemia: Secondary | ICD-10-CM

## 2024-02-03 DIAGNOSIS — I10 Essential (primary) hypertension: Secondary | ICD-10-CM

## 2024-02-03 DIAGNOSIS — E559 Vitamin D deficiency, unspecified: Secondary | ICD-10-CM | POA: Diagnosis not present

## 2024-02-03 DIAGNOSIS — E118 Type 2 diabetes mellitus with unspecified complications: Secondary | ICD-10-CM

## 2024-02-03 DIAGNOSIS — Z7985 Long-term (current) use of injectable non-insulin antidiabetic drugs: Secondary | ICD-10-CM

## 2024-02-03 DIAGNOSIS — Z7984 Long term (current) use of oral hypoglycemic drugs: Secondary | ICD-10-CM | POA: Diagnosis not present

## 2024-02-03 DIAGNOSIS — E1169 Type 2 diabetes mellitus with other specified complication: Secondary | ICD-10-CM

## 2024-02-03 DIAGNOSIS — Z794 Long term (current) use of insulin: Secondary | ICD-10-CM | POA: Diagnosis not present

## 2024-02-03 LAB — POCT GLYCOSYLATED HEMOGLOBIN (HGB A1C): Hemoglobin A1C: 8.7 % — AB (ref 4.0–5.6)

## 2024-02-03 MED ORDER — TOUJEO MAX SOLOSTAR 300 UNIT/ML ~~LOC~~ SOPN: 55.0000 [IU] | PEN_INJECTOR | Freq: Every evening | SUBCUTANEOUS | 3 refills | Status: AC

## 2024-02-03 MED ORDER — SITAGLIPTIN PHOSPHATE 100 MG PO TABS: 100.0000 mg | ORAL_TABLET | Freq: Every day | ORAL | 3 refills | Status: AC

## 2024-02-03 MED ORDER — DEXCOM G7 SENSOR MISC: 1 refills | Status: AC

## 2024-02-03 MED ORDER — RELION PEN NEEDLES 31G X 8 MM MISC: 3 refills | Status: AC

## 2024-02-03 MED ORDER — METFORMIN HCL ER 500 MG PO TB24: 1000.0000 mg | ORAL_TABLET | Freq: Every day | ORAL | 3 refills | Status: AC

## 2024-02-03 NOTE — Progress Notes (Signed)
 " 02/03/2024   Endocrinology follow-up note    Subjective:    Patient ID: Melanie Schultz, female    DOB: 06/15/1955,  Melanie Schultz presents today for follow up of uncontrolled type 2 diabetes, hypertension, and hyperlipidemia.  Past Medical History:  Diagnosis Date   Actinic keratosis    Anxiety    Depression    Diabetes mellitus without complication (HCC)    GERD (gastroesophageal reflux disease)    Hypertension    Liver fibrosis    Multiple sclerosis    PONV (postoperative nausea and vomiting)    Sleep apnea    Squamous cell carcinoma of skin 03/04/2023   L post neck, ED[facility]   Past Surgical History:  Procedure Laterality Date   BREAST BIOPSY Left    BREAST EXCISIONAL BIOPSY Left    benign   CESAREAN SECTION     ENDOMETRIAL ABLATION     NASAL SEPTUM SURGERY     TRIGGER FINGER RELEASE Left 05/04/2021   Procedure: RELEASE TRIGGER FINGER/A-1 PULLEY;  Surgeon: Margrette Taft FORBES, MD;  Location: [facility];  Service: Orthopedics;  Laterality: Left;   Social History   Socioeconomic History   Marital status: Divorced    Spouse name: Not on file   Number of children: Not on file   Years of education: Not on file   Highest education level: Not on file  Occupational History   Not on file  Tobacco Use   Smoking status: Former    Current packs/day: 0.00    Types: Cigarettes    Quit date: 02/04/1978    Years since quitting: 46.0   Smokeless tobacco: Never  Vaping Use   Vaping status: Never Used  Substance and Sexual Activity   Alcohol use: No    Alcohol/week: 0.0 standard drinks of alcohol   Drug use: No   Sexual activity: Not on file  Other Topics Concern   Not on file  Social History Narrative   Not on file   Social Drivers of Health   Tobacco Use: Medium Risk (02/03/2024)   Patient History    Smoking Tobacco Use: Former    Smokeless Tobacco Use: Never    Passive Exposure: Not on Actuary Strain: Not on file  Food Insecurity: Not on file   Transportation Needs: Not on file  Physical Activity: Not on file  Stress: Not on file  Social Connections: Not on file  Depression (EYV7-0): Not on file  Alcohol Screen: Not on file  Housing: Not on file  Utilities: Not on file  Health Literacy: Not on file   Outpatient Encounter Medications as of 02/03/2024  Medication Sig   Accu-Chek Softclix Lancets lancets 100 each by Other route 4 (four) times daily. Use as instructed   ARIPiprazole (ABILIFY) 2 MG tablet Take 2 mg by mouth in the morning.   Ascorbic Acid (VITAMIN C) 1000 MG tablet Take 1,000 mg by mouth in the morning.   aspirin EC 81 MG tablet Take 81 mg by mouth daily. Swallow whole.   atenolol  (TENORMIN ) 50 MG tablet Take 1 tablet (50 mg total) by mouth 2 (two) times daily.   BIOTIN PO Take by mouth 2 (two) times daily.   Cholecalciferol (VITAMIN D3) 125 MCG (5000 UT) CAPS Take 5,000 Units by mouth every evening.   Coenzyme Q10 (COQ-10) 100 MG CAPS Take 100 mg by mouth at bedtime.   Collagen-Vitamin C-Biotin (COLLAGEN PO) Take by mouth.   Continuous Blood Gluc Receiver (DEXCOM G7 RECEIVER)  DEVI See admin instructions.   Cyanocobalamin (VITAMIN B-12) 5000 MCG TBDP Take 5,000 mcg by mouth in the morning.   Evening Primrose Oil 1000 MG CAPS Take 1,000 mg by mouth at bedtime.   fenofibrate 54 MG tablet Take 54 mg by mouth daily.   ferrous sulfate 325 (65 FE) MG tablet Take 325 mg by mouth at bedtime.   FLUoxetine (PROZAC) 40 MG capsule 1 capsule Orally Once a day   FOLIC ACID PO Take by mouth.   glucose blood (ACCU-CHEK AVIVA PLUS) test strip TEST 4 TIMES DAILY E11.65   Homeopathic Products (LIVER SUPPORT SL) Take 2 tablets by mouth in the morning.   irbesartan (AVAPRO) 300 MG tablet Take 300 mg by mouth every morning.   Krill Oil 300 MG CAPS Take 300 mg by mouth every evening.   magnesium oxide (MAG-OX) 400 MG tablet Take 400 mg by mouth at bedtime.   methylphenidate (RITALIN) 20 MG tablet Take 20 mg by mouth 4 (four) times  daily.   Milk Thistle 175 MG CAPS Take 175 mg by mouth in the morning.   minoxidil  (LONITEN ) 2.5 MG tablet Take 1 1/2 tabs daily for a total 3.75 mg daily.   Multiple Vitamin (MULTIVITAMIN WITH MINERALS) TABS tablet Take 1 tablet by mouth in the morning. Centrum   Multiple Vitamins-Minerals (ZINC PO) Take 1 tablet by mouth in the morning.   omeprazole (PRILOSEC) 20 MG capsule Take 20 mg by mouth every evening.   OVER THE COUNTER MEDICATION Take by mouth daily. Horse Tail Vitamin daily for hair growth   rosuvastatin (CRESTOR) 20 MG tablet Take 20 mg by mouth in the morning.   [DISCONTINUED] Continuous Glucose Sensor (DEXCOM G7 SENSOR) MISC Change sensor every 10 days. E11.65   [DISCONTINUED] insulin  glargine, 2 Unit Dial , (TOUJEO  MAX SOLOSTAR) 300 UNIT/ML Solostar Pen Inject 50 Units into the skin at bedtime.   [DISCONTINUED] Insulin  Pen Needle (RELION PEN NEEDLE 31G/8MM) 31G X 8 MM MISC USE WITH INSULIN  QID. E11.65   [DISCONTINUED] metFORMIN  (GLUCOPHAGE -XR) 500 MG 24 hr tablet Take 2 tablets (1,000 mg total) by mouth daily with breakfast. TAKE 2 TABLETS BY MOUTH ONCE DAILY WITH BREAKFAST   [DISCONTINUED] sitaGLIPtin  (JANUVIA ) 100 MG tablet Take 1 tablet (100 mg total) by mouth daily.   Continuous Glucose Sensor (DEXCOM G7 SENSOR) MISC Change sensor every 10 days. E11.65   hydroquinone  4 % cream Apply to dark spots on face twice daily, no longer than 3 months at a time. (Patient not taking: Reported on 02/03/2024)   insulin  glargine, 2 Unit Dial , (TOUJEO  MAX SOLOSTAR) 300 UNIT/ML Solostar Pen Inject 55 Units into the skin at bedtime.   Insulin  Pen Needle (RELION PEN NEEDLE 31G/8MM) 31G X 8 MM MISC USE WITH INSULIN  QID. E11.65   metFORMIN  (GLUCOPHAGE -XR) 500 MG 24 hr tablet Take 2 tablets (1,000 mg total) by mouth daily with breakfast. TAKE 2 TABLETS BY MOUTH ONCE DAILY WITH BREAKFAST   sitaGLIPtin  (JANUVIA ) 100 MG tablet Take 1 tablet (100 mg total) by mouth daily.   [DISCONTINUED] betamethasone   valerate ointment (VALISONE ) 0.1 % Apply twice daily to affected areas as needed for itching. Avoid applying to face, groin, and axilla. (Patient not taking: Reported on 02/03/2024)   [DISCONTINUED] BIOTIN PO Take by mouth. (Patient not taking: Reported on 02/03/2024)   [DISCONTINUED] loratadine (CLARITIN) 10 MG tablet Take 10 mg by mouth every evening. (Patient not taking: Reported on 02/03/2024)   [DISCONTINUED] VASCEPA 1 g capsule 2 g 2 (two) times daily. (  Patient not taking: Reported on 02/03/2024)   No facility-administered encounter medications on file as of 02/03/2024.   ALLERGIES: Allergies  Allergen Reactions   Lisinopril Cough    Other reaction(s): cough   Mango Flavoring Agent (Non-Screening) Rash    Mango fruit-rash   VACCINATION STATUS: Immunization History  Administered Date(s) Administered   Moderna Covid-19 Fall Seasonal Vaccine 53yrs & older 10/22/2023    Diabetes She presents for her follow-up diabetic visit. She has type 2 diabetes mellitus. Onset time: She was diagnosed at approximate age of 48 years. Her disease course has been worsening. There are no hypoglycemic associated symptoms. Pertinent negatives for hypoglycemia include no confusion, pallor or seizures. Associated symptoms include foot paresthesias. Pertinent negatives for diabetes include no fatigue, no polydipsia, no polyphagia, no polyuria and no weight loss. There are no hypoglycemic complications. Symptoms are stable. Diabetic complications include nephropathy and peripheral neuropathy. Risk factors for coronary artery disease include dyslipidemia, hypertension, obesity, sedentary lifestyle and diabetes mellitus. Current diabetic treatment includes oral agent (dual therapy) and insulin  injections. She is compliant with treatment most of the time (often forgets her oral meds). Her weight is increasing steadily. She is following a generally unhealthy diet. When asked about meal planning, she reported none. She  has had a previous visit with a dietitian. She participates in exercise intermittently. Her home blood glucose trend is fluctuating minimally. Her overall blood glucose range is >200 mg/dl. (She presents today with her CGM showing above target glycemic profile.  Her POCT A1c today is 8.7%, improving from last visit of 9.1%.  Analysis of her CGM shows TIR 34%, TAR 66%, TBR 0% with a GMI of 8.3%. She has been working for [employer] lately, but notes she is not exercising as much as she should.  She is also getting up earlier for this job, therefore having spike at 3 am in glucose from eating.) An ACE inhibitor/angiotensin II receptor blocker is being taken. She does not see a podiatrist.Eye exam is current.     Review of systems  Constitutional: + stable body weight,  current Body mass index is 33.73 kg/m. , no fatigue, no subjective hyperthermia, no subjective hypothermia Eyes: no blurry vision, no xerophthalmia ENT: no sore throat, no nodules palpated in throat, no dysphagia/odynophagia, no hoarseness Cardiovascular: no chest pain, no shortness of breath, no palpitations, no leg swelling Respiratory: no cough, no shortness of breath Gastrointestinal: no nausea/vomiting/diarrhea Musculoskeletal: no muscle/joint aches, + muscle weakness (history of MS) Skin: no rashes, no hyperemia, + hair loss from MS medication Neurological: no tremors, + numbness/tingling to BLE (esp bottom of feet), no dizziness Psychiatric: no depression, no anxiety    Objective:    BP 118/66 (BP Location: Right Arm, Patient Position: Sitting, Cuff Size: Large)   Pulse 64   Ht 5' 4.5 (1.638 m)   Wt 199 lb 9.6 oz (90.5 kg)   BMI 33.73 kg/m   Wt Readings from Last 3 Encounters:  02/03/24 199 lb 9.6 oz (90.5 kg)  08/27/23 201 lb (91.2 kg)  05/14/23 199 lb 12.8 oz (90.6 kg)    BP Readings from Last 3 Encounters:  02/03/24 118/66  08/27/23 130/82  05/14/23 136/80     Physical Exam- Limited  Constitutional:  Body  mass index is 33.73 kg/m. , not in acute distress, normal state of mind Eyes:  EOMI, no exophthalmos Musculoskeletal: no gross deformities, strength intact in all four extremities, no gross restriction of joint movements Skin:  no rashes, no hyperemia Neurological: no  tremor with outstretched hands   Diabetic Foot Exam - Simple   No data filed     Results for orders placed or performed in visit on 02/03/24  HgB A1c   Collection Time: 02/03/24 10:21 AM  Result Value Ref Range   Hemoglobin A1C 8.7 (A) 4.0 - 5.6 %   HbA1c POC (<> result, manual entry)     HbA1c, POC (prediabetic range)     HbA1c, POC (controlled diabetic range)     Lipid Panel     Component Value Date/Time   CHOL 143 07/25/2021 1646   CHOL 156 07/31/2020 0834   TRIG 237 (A) 07/08/2022 0000   HDL 37 07/08/2022 0000   HDL 33 (L) 07/31/2020 0834   CHOLHDL 4.7 (H) 07/31/2020 0834   CHOLHDL 4.2 06/17/2019 0818   VLDL 57 (H) 09/04/2016 1036   LDLCALC 113 07/08/2022 0000   LDLCALC 90 07/31/2020 0834   LDLCALC 90 06/17/2019 0818   Recent Results (from the past 2160 hours)  HgB A1c     Status: Abnormal   Collection Time: 02/03/24 10:21 AM  Result Value Ref Range   Hemoglobin A1C 8.7 (A) 4.0 - 5.6 %   HbA1c POC (<> result, manual entry)     HbA1c, POC (prediabetic range)     HbA1c, POC (controlled diabetic range)         Assessment & Plan:   1) Controlled type 2 diabetes mellitus without complication, with long-term current use of insulin  (HCC)  She presents today with her CGM showing above target glycemic profile.  Her POCT A1c today is 8.7%, improving from last visit of 9.1%.  Analysis of her CGM shows TIR 34%, TAR 66%, TBR 0% with a GMI of 8.3%. She has been working for [employer] lately, but notes she is not exercising as much as she should.  She is also getting up earlier for this job, therefore having spike at 3 am in glucose from eating.   Recent labs reviewed.  - Patient remains at a high risk for more  acute and chronic complications of diabetes which include CAD, CVA, CKD, retinopathy, and neuropathy. These are all discussed in detail with the patient.  - Nutritional counseling repeated/built upon at each appointment.  - The patient admits there is a room for improvement in their diet and drink choices. -  Suggestion is made for the patient to avoid simple carbohydrates from their diet including Cakes, Sweet Desserts / Pastries, Ice Cream, Soda (diet and regular), Sweet Tea, Candies, Chips, Cookies, Sweet Pastries, Store Bought Juices, Alcohol in Excess of 1-2 drinks a day, Artificial Sweeteners, Coffee Creamer, and Sugar-free Products. This will help patient to have stable blood glucose profile and potentially avoid unintended weight gain.   - I encouraged the patient to switch to unprocessed or minimally processed complex starch and increased protein intake (animal or plant source), fruits, and vegetables.   - Patient is advised to stick to a routine mealtimes to eat 3 meals a day and avoid unnecessary snacks (to snack only to correct hypoglycemia).  - I have approached patient with the following individualized plan to manage diabetes and patient agrees.  -She is advised to increase her Toujeo  slightly to 55 units SQ nightly and continue Metformin  1000 mg ER daily and Januvia  100 mg po daily.  She is asking for more time to get her eating right before adjusting her diabetes regimen.  I did encourage her to take her insulin  at night even if glucose is  in the 150s (whereas she was skipping the dose to prevent hypoglycemia).  -She is encouraged to continue to monitor blood glucose at least 4 times a day (using her CGM), before meals and at bedtime and report blood glucose readings less than 70 or greater than 200 for 3 tests in a row.    -She did not tolerate Ozempic .  - Patient specific target  for A1c; LDL, HDL, Triglycerides, and  Waist Circumference were discussed in detail.  2) BP/HTN:   Her blood pressure is controlled to target.  She is advised to continue her medications as prescribed by PCP.   3) Lipids/HPL: Her most recent lipid panel from 07/08/22 show uncontrolled LDL at 113 and elevated triglycerides of 237.  She is advised to continue Crestor 20 mg po daily at bedtime and Vascepa 1 g po BID (says she has been forgetting her second dose).  Side effects and precautions discussed with her.  Will have records sent to us .  4)  Weight/Diet:  Her Body mass index is 33.73 kg/m. -clearly complicating her diabetes management.  She is a candidate for modest weight loss.  CDE consult in progress, exercise, and carbohydrates information provided.  5) Chronic Care/Health Maintenance: -Patient is on ACE and Statin medications and is encouraged to continue to follow up with Ophthalmology, Podiatrist at least yearly or according to recommendations, and advised to  stay away from smoking. I have recommended yearly flu vaccine and pneumonia vaccination at least every 5 years; moderate intensity exercise for up to 150 minutes weekly; and  sleep for at least 7 hours a day.  I advised patient to maintain close follow up with her PCP for primary care needs and regarding her skin lesion.     I spent  44  minutes in the care of the patient today including review of labs from CMP, Lipids, Thyroid Function, Hematology (current and previous including abstractions from other facilities); face-to-face time discussing  her blood glucose readings/logs, discussing hypoglycemia and hyperglycemia episodes and symptoms, medications doses, her options of short and long term treatment based on the latest standards of care / guidelines;  discussion about incorporating lifestyle medicine;  and documenting the encounter. Risk reduction counseling performed per USPSTF guidelines to reduce obesity and cardiovascular risk factors.     Please refer to Patient Instructions for Blood Glucose Monitoring and  Insulin /Medications Dosing Guide  in media tab for additional information. Please  also refer to  Patient Self Inventory in the Media  tab for reviewed elements of pertinent patient history.  Melanie Schultz participated in the discussions, expressed understanding, and voiced agreement with the above plans.  All questions were answered to her satisfaction. she is encouraged to contact clinic should she have any questions or concerns prior to her return visit.     Follow up plan: Return in about 4 months (around 06/03/2024) for Diabetes F/U with A1c in office, No previsit labs, Bring meter and logs.   Benton Rio, [facility] [address] [place], [place] [address] Phone: [phone] Fax: [phone]  02/03/2024, 10:37 AM "

## 2024-02-11 ENCOUNTER — Other Ambulatory Visit (HOSPITAL_COMMUNITY): Payer: Self-pay

## 2024-02-25 ENCOUNTER — Ambulatory Visit

## 2024-02-25 DIAGNOSIS — D229 Melanocytic nevi, unspecified: Secondary | ICD-10-CM

## 2024-02-25 DIAGNOSIS — Z85828 Personal history of other malignant neoplasm of skin: Secondary | ICD-10-CM | POA: Diagnosis not present

## 2024-02-25 DIAGNOSIS — D1801 Hemangioma of skin and subcutaneous tissue: Secondary | ICD-10-CM | POA: Diagnosis not present

## 2024-02-25 DIAGNOSIS — W908XXA Exposure to other nonionizing radiation, initial encounter: Secondary | ICD-10-CM

## 2024-02-25 DIAGNOSIS — Z1283 Encounter for screening for malignant neoplasm of skin: Secondary | ICD-10-CM | POA: Diagnosis not present

## 2024-02-25 DIAGNOSIS — L820 Inflamed seborrheic keratosis: Secondary | ICD-10-CM

## 2024-02-25 DIAGNOSIS — C4490 Unspecified malignant neoplasm of skin, unspecified: Secondary | ICD-10-CM

## 2024-02-25 DIAGNOSIS — L578 Other skin changes due to chronic exposure to nonionizing radiation: Secondary | ICD-10-CM

## 2024-02-25 DIAGNOSIS — L814 Other melanin hyperpigmentation: Secondary | ICD-10-CM

## 2024-02-25 DIAGNOSIS — L570 Actinic keratosis: Secondary | ICD-10-CM

## 2024-02-25 DIAGNOSIS — L821 Other seborrheic keratosis: Secondary | ICD-10-CM

## 2024-02-25 DIAGNOSIS — L649 Androgenic alopecia, unspecified: Secondary | ICD-10-CM

## 2024-02-25 NOTE — Progress Notes (Signed)
 "   Subjective   Melanie Schultz is a 69 y.o. female who presents for the following: Total body skin exam for skin cancer screening and mole check. The patient has spots, moles and lesions to be evaluated, some may be new or changing and the patient may have concern these could be cancer. Patient is established patient   Today patient reports: Patient states she has concerns about hair thinning.  She has been taking minoxidil  for androgenetic alopecia and states she's not sure she's taken it long enough to notice an effect.  Review of Systems:    No other skin or systemic complaints except as noted in HPI or Assessment and Plan.  The following portions of the chart were reviewed this encounter and updated as appropriate: medications, allergies, medical history  Relevant Medical History:  Personal history of non melanoma skin cancer - see medical history for full details   Objective  (SKPE) Well appearing patient in no apparent distress; mood and affect are within normal limits. Examination was performed of the: Full Skin Examination: scalp, head, eyes, ears, nose, lips, neck, chest, axillae, abdomen, back, buttocks, bilateral upper extremities, bilateral lower extremities, hands, feet, fingers, toes, fingernails, and toenails.   Examination notable for: SKIN EXAM, Angioma(s): Scattered red vascular papule(s)  , Lentigo/lentigines: Scattered pigmented macules that are tan to brown in color and are somewhat non-uniform in shape and concentrated in the sun-exposed areas, Nevus/nevi: Scattered well-demarcated, regular, pigmented macule(s) and/or papule(s)  , Seborrheic Keratosis(es): Stuck-on appearing keratotic papule(s) on the trunk, some  irritated with redness, crusting, edema, and/or partial avulsion, Actinic Damage/Elastosis: chronic sun damage: dyspigmentation, telangiectasia, and wrinkling, Actinic keratosis: Scaly erythematous macule(s) concentrated on sun exposed areas   - Thinning of hair  on frontal and vertex of the scalp with retention of frontal hairline and widening of the part.  No erythema, no scale, no scarring, no papules, and no pustules on exam today.  Examination limited by: Undergarments, Shoes or socks , and Clothing   arms x6, face x1 (7) Erythematous thin papules/macules with gritty scale. left breast x1 Erythematous stuck-on, waxy papule or plaque  Assessment & Plan  (SKAP)   SKIN CANCER SCREENING PERFORMED TODAY.  BENIGN SKIN FINDINGS  - Lentigines  - Seborrheic keratoses  - Hemangiomas   - Nevus/Multiple Benign Nevi  - Reassurance provided regarding the benign appearance of lesions noted on exam today; no treatment is indicated in the absence of symptoms/changes. - Reinforced importance of photoprotective strategies including liberal and frequent sunscreen use of a broad-spectrum SPF 30 or greater, use of protective clothing, and sun avoidance for prevention of cutaneous malignancy and photoaging.  Counseled patient on the importance of regular self-skin monitoring as well as routine clinical skin examinations as scheduled.   ACTINIC DAMAGE - Chronic condition, secondary to cumulative UV/sun exposure - Recommend daily broad spectrum sunscreen SPF 30+ to sun-exposed areas, reapply every 2 hours as needed.  - Staying in the shade or wearing long sleeves, sun glasses (UVA+UVB protection) and wide brim hats (4-inch brim around the entire circumference of the hat) are also recommended for sun protection.  - Call for new or changing lesions.  HISTORY OF SQUAMOUS CELL CARCINOMA OF THE SKIN - L post neck, tx with ED&C on 03/04/23 - No evidence of recurrence today - No lymphadenopathy - Recommend regular full body skin exams - Recommend daily broad spectrum sunscreen SPF 30+ to sun-exposed areas, reapply every 2 hours as needed.  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 Condition is symptomatic and bothersome to patient. Patient is flaring and not currently at treatment goal.  -Recommend adding topical minoxidil  nightly -For FPHL Continue Minoxidil  3.75 mg po QD Screened for cardiac, renal, or hepatic disease. Side effects: Hypotension, headaches, hypertrichosis, leg swelling, rash  Plan:  - Check CBC, thyroid function, vitamin D , iron, and ferritin     Was sun protection counseling provided?: Yes   Level of service outlined above   Patient instructions (SKPI)   Procedures, orders, diagnosis for this visit:  AK (ACTINIC KERATOSIS) (7) arms x6, face x1 (7) Actinic keratoses are precancerous spots that appear secondary to cumulative UV radiation exposure/sun exposure over time. They are chronic with expected duration over 1 year. A portion of actinic keratoses will progress to squamous cell carcinoma of the skin. It is not possible to reliably predict which spots will progress to skin cancer and so treatment is recommended to prevent development of skin cancer.  Recommend daily broad spectrum sunscreen SPF 30+ to sun-exposed areas, reapply every 2 hours as needed.  Recommend staying in the shade or wearing long sleeves, sun glasses (UVA+UVB protection) and wide brim hats (4-inch brim around the entire circumference of the hat). Call for new or changing lesions. - Destruction of lesion - arms x6, face x1 (7) Complexity: simple   Destruction method: cryotherapy   Informed consent: discussed and consent obtained   Timeout:  patient name, date of birth, surgical site, and procedure verified Lesion destroyed using liquid nitrogen: Yes   Region frozen until ice ball extended beyond lesion: Yes   Cryo cycles: 1 or 2. Outcome: patient tolerated procedure well with no complications   Post-procedure details: wound care  instructions given    INFLAMED SEBORRHEIC KERATOSIS left breast x1 - Destruction of lesion - left breast x1 Complexity: simple   Destruction method: cryotherapy   Informed consent: discussed and consent obtained   Timeout:  patient name, date of birth, surgical site, and procedure verified Lesion destroyed using liquid nitrogen: Yes   Region frozen until ice ball extended beyond lesion: Yes   Cryo cycles: 1 or 2. Outcome: patient tolerated procedure well with no complications   Post-procedure details: wound care instructions given   Additional details:  Prior to procedure, discussed risks of blister formation, small wound, skin dyspigmentation, or rare scar following cryotherapy. Recommend Vaseline ointment to treated areas while healing.   ANDROGENIC ALOPECIA   This Visit - TSH - CBC - Iron - Ferritin - VITAMIN D  25 Hydroxy (Vit-D Deficiency, Fractures)  Androgenic alopecia -     TSH -     CBC -     Iron -     Ferritin -     VITAMIN D  25 Hydroxy (Vit-D Deficiency, Fractures)  AK (actinic keratosis) -     Destruction of lesion  Inflamed seborrheic keratosis -     Destruction of lesion    Return to clinic: Return for 4-6 months hairloss f/u , w/ Dr. Raymund.  I, Almetta Nora, RMA, am acting as scribe for Lauraine JAYSON Raymund, MD .   Documentation: I have reviewed the above documentation for accuracy and completeness, and I agree with the above.  Lauraine JAYSON Raymund, MD  "

## 2024-02-25 NOTE — Patient Instructions (Signed)

## 2024-02-26 ENCOUNTER — Ambulatory Visit: Payer: Self-pay

## 2024-02-26 LAB — IRON: Iron: 73 ug/dL (ref 27–139)

## 2024-02-26 LAB — CBC
Hematocrit: 34.5 % (ref 34.0–46.6)
Hemoglobin: 11.2 g/dL (ref 11.1–15.9)
MCH: 29.5 pg (ref 26.6–33.0)
MCHC: 32.5 g/dL (ref 31.5–35.7)
MCV: 91 fL (ref 79–97)
Platelets: 266 x10E3/uL (ref 150–450)
RBC: 3.8 x10E6/uL (ref 3.77–5.28)
RDW: 12.3 % (ref 11.7–15.4)
WBC: 11.2 x10E3/uL — ABNORMAL HIGH (ref 3.4–10.8)

## 2024-02-26 LAB — FERRITIN: Ferritin: 217 ng/mL — ABNORMAL HIGH (ref 15–150)

## 2024-02-26 LAB — TSH: TSH: 2.95 u[IU]/mL (ref 0.450–4.500)

## 2024-02-26 LAB — VITAMIN D 25 HYDROXY (VIT D DEFICIENCY, FRACTURES): Vit D, 25-Hydroxy: 32.3 ng/mL (ref 30.0–100.0)

## 2024-02-26 NOTE — Telephone Encounter (Signed)
 Patient left voicemail on nurse line, returning call. Left voicemail if patient would like to call back with questions.

## 2024-02-26 NOTE — Telephone Encounter (Signed)
Left message on voicemail to return my call.  

## 2024-02-26 NOTE — Telephone Encounter (Signed)
-----   Message from Lauraine Kanaris, MD sent at 02/26/2024 12:35 PM EST ----- Please notify patient with below plan: Labs reviewed and within acceptable limits

## 2024-03-02 NOTE — Telephone Encounter (Signed)
Left message on voicemail to return my call.  

## 2024-03-02 NOTE — Telephone Encounter (Signed)
-----   Message from Lauraine Kanaris, MD sent at 02/26/2024 12:35 PM EST ----- Please notify patient with below plan: Labs reviewed and within acceptable limits

## 2024-03-04 ENCOUNTER — Telehealth: Payer: Self-pay

## 2024-03-04 NOTE — Telephone Encounter (Signed)
 Patient states she was return a call to [place] concerning results. Left message on nurse line. Tried calling patient back. No answer. Lm for patient to return call.

## 2024-03-11 ENCOUNTER — Telehealth: Payer: Self-pay

## 2024-03-11 NOTE — Telephone Encounter (Signed)
 Patient called regarding several messages she had received. Went over lab results with patient.  Verbalized understanding  Patient will keep follow up scheduled 4 - 6 months.    Per Dr. Raymund lab result message  Please notify patient with below plan: Labs reviewed and within acceptable limits  TSH; CBC; Iron; Ferritin; VITAMIN D  25 Hydroxy (Vit-D Deficiency, Fractures)

## 2024-06-03 ENCOUNTER — Ambulatory Visit: Admitting: Nurse Practitioner

## 2024-06-24 ENCOUNTER — Ambulatory Visit
